# Patient Record
Sex: Male | Born: 1945 | Race: White | Hispanic: No | Marital: Single | Smoking: Former smoker
Health system: Southern US, Community
[De-identification: ages and names within clinical notes are randomized; demographics above are authoritative.]

## PROBLEM LIST (undated history)

## (undated) DIAGNOSIS — C61 Malignant neoplasm of prostate: Secondary | ICD-10-CM

## (undated) DIAGNOSIS — I248 Other forms of acute ischemic heart disease: Secondary | ICD-10-CM

## (undated) DIAGNOSIS — F1010 Alcohol abuse, uncomplicated: Secondary | ICD-10-CM

## (undated) DIAGNOSIS — I350 Nonrheumatic aortic (valve) stenosis: Secondary | ICD-10-CM

## (undated) DIAGNOSIS — F0390 Unspecified dementia without behavioral disturbance: Secondary | ICD-10-CM

## (undated) DIAGNOSIS — H9190 Unspecified hearing loss, unspecified ear: Secondary | ICD-10-CM

## (undated) DIAGNOSIS — K219 Gastro-esophageal reflux disease without esophagitis: Secondary | ICD-10-CM

## (undated) DIAGNOSIS — F319 Bipolar disorder, unspecified: Secondary | ICD-10-CM

## (undated) DIAGNOSIS — I5189 Other ill-defined heart diseases: Secondary | ICD-10-CM

## (undated) DIAGNOSIS — E785 Hyperlipidemia, unspecified: Secondary | ICD-10-CM

## (undated) DIAGNOSIS — F0280 Dementia in other diseases classified elsewhere without behavioral disturbance: Secondary | ICD-10-CM

## (undated) DIAGNOSIS — R011 Cardiac murmur, unspecified: Secondary | ICD-10-CM

## (undated) DIAGNOSIS — I639 Cerebral infarction, unspecified: Secondary | ICD-10-CM

## (undated) DIAGNOSIS — J449 Chronic obstructive pulmonary disease, unspecified: Secondary | ICD-10-CM

## (undated) DIAGNOSIS — G4730 Sleep apnea, unspecified: Secondary | ICD-10-CM

## (undated) DIAGNOSIS — I10 Essential (primary) hypertension: Secondary | ICD-10-CM

## (undated) DIAGNOSIS — E559 Vitamin D deficiency, unspecified: Secondary | ICD-10-CM

## (undated) HISTORY — DX: Malignant neoplasm of prostate: C61

## (undated) HISTORY — DX: Alcohol abuse, uncomplicated: F10.10

## (undated) HISTORY — DX: Hyperlipidemia, unspecified: E78.5

## (undated) HISTORY — DX: Vitamin D deficiency, unspecified: E55.9

## (undated) HISTORY — PX: HERNIA REPAIR: SHX51

## (undated) HISTORY — DX: Essential (primary) hypertension: I10

## (undated) HISTORY — DX: Gastro-esophageal reflux disease without esophagitis: K21.9

## (undated) HISTORY — DX: Dementia in other diseases classified elsewhere, unspecified severity, without behavioral disturbance, psychotic disturbance, mood disturbance, and anxiety: F02.80

## (undated) HISTORY — PX: NM MYOVIEW LTD: HXRAD82

## (undated) HISTORY — DX: Bipolar disorder, unspecified: F31.9

## (undated) HISTORY — DX: Sleep apnea, unspecified: G47.30

## (undated) HISTORY — DX: Other forms of acute ischemic heart disease: I24.8

## (undated) HISTORY — PX: KNEE SURGERY: SHX244

---

## 2003-03-24 ENCOUNTER — Other Ambulatory Visit: Payer: Self-pay

## 2003-08-12 ENCOUNTER — Other Ambulatory Visit: Payer: Self-pay

## 2003-09-21 ENCOUNTER — Other Ambulatory Visit: Payer: Self-pay

## 2003-12-21 ENCOUNTER — Emergency Department: Payer: Self-pay | Admitting: Unknown Physician Specialty

## 2004-03-19 ENCOUNTER — Emergency Department (HOSPITAL_COMMUNITY): Admission: EM | Admit: 2004-03-19 | Discharge: 2004-03-19 | Payer: Self-pay | Admitting: Emergency Medicine

## 2004-03-27 ENCOUNTER — Emergency Department: Payer: Self-pay | Admitting: General Practice

## 2004-03-27 ENCOUNTER — Emergency Department: Payer: Self-pay | Admitting: Emergency Medicine

## 2006-04-29 ENCOUNTER — Emergency Department: Payer: Self-pay

## 2006-04-29 ENCOUNTER — Other Ambulatory Visit: Payer: Self-pay

## 2006-06-28 ENCOUNTER — Other Ambulatory Visit: Payer: Self-pay

## 2006-06-28 ENCOUNTER — Emergency Department: Payer: Self-pay | Admitting: Emergency Medicine

## 2007-04-03 ENCOUNTER — Other Ambulatory Visit: Payer: Self-pay

## 2007-04-03 ENCOUNTER — Inpatient Hospital Stay: Payer: Self-pay | Admitting: Internal Medicine

## 2009-06-02 ENCOUNTER — Inpatient Hospital Stay: Payer: Self-pay | Admitting: Psychiatry

## 2010-04-02 ENCOUNTER — Emergency Department: Payer: Self-pay | Admitting: Emergency Medicine

## 2010-08-03 ENCOUNTER — Emergency Department: Payer: Self-pay | Admitting: Emergency Medicine

## 2011-08-26 ENCOUNTER — Emergency Department: Payer: Self-pay | Admitting: Emergency Medicine

## 2011-08-26 LAB — URINALYSIS, COMPLETE
Leukocyte Esterase: NEGATIVE
Squamous Epithelial: NONE SEEN
WBC UR: NONE SEEN /HPF (ref 0–5)

## 2011-08-26 LAB — BASIC METABOLIC PANEL
BUN: 8 mg/dL (ref 7–18)
Co2: 27 mmol/L (ref 21–32)
EGFR (African American): >60
Glucose: 112 mg/dL — ABNORMAL HIGH (ref 65–99)
Osmolality: 288 (ref 275–301)
Potassium: 4 mmol/L (ref 3.5–5.1)

## 2011-08-26 LAB — CBC
HGB: 15.7 g/dL (ref 13.0–18.0)
MCH: 34.3 pg — ABNORMAL HIGH (ref 26.0–34.0)
MCHC: 33.2 g/dL (ref 32.0–36.0)
MCV: 103 fL — ABNORMAL HIGH (ref 80–100)

## 2011-08-26 LAB — ETHANOL
Ethanol %: 0.163 % — ABNORMAL HIGH (ref 0.000–0.080)
Ethanol %: 0.135 % — ABNORMAL HIGH (ref 0.000–0.080)
Ethanol: 135 mg/dL

## 2012-04-30 ENCOUNTER — Emergency Department: Payer: Self-pay | Admitting: Emergency Medicine

## 2012-04-30 LAB — CBC WITH DIFFERENTIAL/PLATELET
HCT: 43 % (ref 40.0–52.0)
Lymphocyte %: 16.2 %
MCH: 33.6 pg (ref 26.0–34.0)
Monocyte #: 0.7 x10 3/mm (ref 0.2–1.0)
Monocyte %: 9.1 %
Neutrophil #: 5.6 10*3/uL (ref 1.4–6.5)
RBC: 4.38 10*6/uL — ABNORMAL LOW (ref 4.40–5.90)
RDW: 12.5 % (ref 11.5–14.5)
WBC: 7.9 10*3/uL (ref 3.8–10.6)

## 2013-12-22 LAB — PULMONARY FUNCTION TEST

## 2014-01-17 DIAGNOSIS — I248 Other forms of acute ischemic heart disease: Secondary | ICD-10-CM

## 2014-01-17 DIAGNOSIS — I2489 Other forms of acute ischemic heart disease: Secondary | ICD-10-CM | POA: Insufficient documentation

## 2014-01-17 HISTORY — DX: Other forms of acute ischemic heart disease: I24.89

## 2014-01-17 HISTORY — DX: Other forms of acute ischemic heart disease: I24.8

## 2014-01-19 ENCOUNTER — Inpatient Hospital Stay: Payer: Self-pay | Admitting: Internal Medicine

## 2014-01-19 LAB — CBC
HCT: 43.2 % (ref 40.0–52.0)
HGB: 14.7 g/dL (ref 13.0–18.0)
MCH: 33.2 pg (ref 26.0–34.0)
MCHC: 34 g/dL (ref 32.0–36.0)
MCV: 98 fL (ref 80–100)
PLATELETS: 158 10*3/uL (ref 150–440)
RBC: 4.42 10*6/uL (ref 4.40–5.90)
RDW: 12.8 % (ref 11.5–14.5)
WBC: 10.7 10*3/uL — ABNORMAL HIGH (ref 3.8–10.6)

## 2014-01-19 LAB — DRUG SCREEN, URINE

## 2014-01-19 LAB — URINALYSIS, COMPLETE
Bacteria: NONE SEEN
Bilirubin,UR: NEGATIVE
GLUCOSE, UR: NEGATIVE mg/dL (ref 0–75)
Leukocyte Esterase: NEGATIVE
Nitrite: NEGATIVE
PH: 5 (ref 4.5–8.0)
PROTEIN: NEGATIVE
RBC,UR: <1 /HPF (ref 0–5)
Specific Gravity: 1.005 (ref 1.003–1.030)
Squamous Epithelial: NONE SEEN
WBC UR: 1 /HPF (ref 0–5)

## 2014-01-19 LAB — CK-MB: CK-MB: 6.9 ng/mL — AB (ref 0.5–3.6)

## 2014-01-19 LAB — PROTIME-INR
INR: 1
Prothrombin Time: 13 secs (ref 11.5–14.7)

## 2014-01-19 LAB — BASIC METABOLIC PANEL
Anion Gap: 16 (ref 7–16)
BUN: 20 mg/dL — ABNORMAL HIGH (ref 7–18)
CALCIUM: 8.4 mg/dL — AB (ref 8.5–10.1)
CHLORIDE: 87 mmol/L — AB (ref 98–107)
Co2: 26 mmol/L (ref 21–32)
Creatinine: 1.5 mg/dL — ABNORMAL HIGH (ref 0.60–1.30)
EGFR (African American): >60
GFR CALC NON AF AMER: 50 — AB
Glucose: 96 mg/dL (ref 65–99)
OSMOLALITY: 261 (ref 275–301)
Potassium: 3.2 mmol/L — ABNORMAL LOW (ref 3.5–5.1)
Sodium: 129 mmol/L — ABNORMAL LOW (ref 136–145)

## 2014-01-19 LAB — APTT: ACTIVATED PTT: 31.5 s (ref 23.6–35.9)

## 2014-01-19 LAB — TROPONIN I: Troponin-I: 0.36 ng/mL — ABNORMAL HIGH

## 2014-01-19 LAB — ETHANOL: ETHANOL LVL: 325 mg/dL — AB

## 2014-01-19 LAB — MAGNESIUM: MAGNESIUM: 1.9 mg/dL

## 2014-01-20 DIAGNOSIS — I255 Ischemic cardiomyopathy: Secondary | ICD-10-CM

## 2014-01-20 DIAGNOSIS — I10 Essential (primary) hypertension: Secondary | ICD-10-CM

## 2014-01-20 LAB — BASIC METABOLIC PANEL
ANION GAP: 13 (ref 7–16)
BUN: 20 mg/dL — AB (ref 7–18)
CALCIUM: 7.8 mg/dL — AB (ref 8.5–10.1)
CHLORIDE: 98 mmol/L (ref 98–107)
CREATININE: 1.23 mg/dL (ref 0.60–1.30)
Co2: 24 mmol/L (ref 21–32)
EGFR (African American): >60
GLUCOSE: 95 mg/dL (ref 65–99)
Osmolality: 273 (ref 275–301)
POTASSIUM: 3.2 mmol/L — AB (ref 3.5–5.1)
SODIUM: 135 mmol/L — AB (ref 136–145)

## 2014-01-20 LAB — CK-MB
CK-MB: 7.4 ng/mL — ABNORMAL HIGH (ref 0.5–3.6)
CK-MB: 6.3 ng/mL — AB (ref 0.5–3.6)

## 2014-01-20 LAB — CBC WITH DIFFERENTIAL/PLATELET
BASOS ABS: 0 10*3/uL (ref 0.0–0.1)
BASOS PCT: 0.3 %
EOS PCT: 0.4 %
Eosinophil #: 0 10*3/uL (ref 0.0–0.7)
HCT: 39.9 % — AB (ref 40.0–52.0)
HGB: 13.6 g/dL (ref 13.0–18.0)
LYMPHS ABS: 3.2 10*3/uL (ref 1.0–3.6)
Lymphocyte %: 31.3 %
MCH: 33.2 pg (ref 26.0–34.0)
MCHC: 34.1 g/dL (ref 32.0–36.0)
MCV: 98 fL (ref 80–100)
MONO ABS: 0.7 x10 3/mm (ref 0.2–1.0)
Monocyte %: 6.6 %
NEUTROS PCT: 61.4 %
Neutrophil #: 6.3 10*3/uL (ref 1.4–6.5)
Platelet: 159 10*3/uL (ref 150–440)
RBC: 4.1 10*6/uL — ABNORMAL LOW (ref 4.40–5.90)
RDW: 12.4 % (ref 11.5–14.5)
WBC: 10.2 10*3/uL (ref 3.8–10.6)

## 2014-01-20 LAB — CK: CK, TOTAL: 2704 U/L — AB

## 2014-01-20 LAB — HEPARIN LEVEL (UNFRACTIONATED): Anti-Xa(Unfractionated): 0.52 IU/mL (ref 0.30–0.70)

## 2014-01-20 LAB — TROPONIN I
TROPONIN-I: 0.42 ng/mL — AB
Troponin-I: 0.46 ng/mL — ABNORMAL HIGH

## 2014-01-21 DIAGNOSIS — I214 Non-ST elevation (NSTEMI) myocardial infarction: Secondary | ICD-10-CM

## 2014-01-21 LAB — CBC WITH DIFFERENTIAL/PLATELET
BASOS ABS: 0 10*3/uL (ref 0.0–0.1)
BASOS PCT: 0.4 %
EOS ABS: 0.1 10*3/uL (ref 0.0–0.7)
Eosinophil %: 1.7 %
HCT: 35.5 % — ABNORMAL LOW (ref 40.0–52.0)
HGB: 12.2 g/dL — AB (ref 13.0–18.0)
LYMPHS ABS: 2.5 10*3/uL (ref 1.0–3.6)
Lymphocyte %: 37.3 %
MCH: 33.4 pg (ref 26.0–34.0)
MCHC: 34.5 g/dL (ref 32.0–36.0)
MCV: 97 fL (ref 80–100)
Monocyte #: 0.7 x10 3/mm (ref 0.2–1.0)
Monocyte %: 10.3 %
NEUTROS ABS: 3.3 10*3/uL (ref 1.4–6.5)
Neutrophil %: 50.3 %
PLATELETS: 130 10*3/uL — AB (ref 150–440)
RBC: 3.66 10*6/uL — AB (ref 4.40–5.90)
RDW: 13 % (ref 11.5–14.5)
WBC: 6.6 10*3/uL (ref 3.8–10.6)

## 2014-01-21 LAB — BASIC METABOLIC PANEL
Anion Gap: 7 (ref 7–16)
BUN: 18 mg/dL (ref 7–18)
CHLORIDE: 108 mmol/L — AB (ref 98–107)
Calcium, Total: 7.7 mg/dL — ABNORMAL LOW (ref 8.5–10.1)
Co2: 25 mmol/L (ref 21–32)
Creatinine: 0.94 mg/dL (ref 0.60–1.30)
EGFR (African American): >60
EGFR (Non-African Amer.): >60
Glucose: 83 mg/dL (ref 65–99)
OSMOLALITY: 280 (ref 275–301)
Potassium: 3.8 mmol/L (ref 3.5–5.1)
Sodium: 140 mmol/L (ref 136–145)

## 2014-01-21 LAB — HEPARIN LEVEL (UNFRACTIONATED): ANTI-XA(UNFRACTIONATED): 0.27 [IU]/mL — AB (ref 0.30–0.70)

## 2014-01-21 LAB — CK: CK, TOTAL: 926 U/L — AB

## 2014-01-23 DIAGNOSIS — R7989 Other specified abnormal findings of blood chemistry: Secondary | ICD-10-CM

## 2014-01-26 DIAGNOSIS — I214 Non-ST elevation (NSTEMI) myocardial infarction: Secondary | ICD-10-CM

## 2014-02-16 ENCOUNTER — Encounter: Payer: Self-pay | Admitting: *Deleted

## 2014-02-17 ENCOUNTER — Encounter: Payer: Self-pay | Admitting: *Deleted

## 2014-02-17 ENCOUNTER — Encounter: Payer: Medicare Other | Admitting: Cardiology

## 2014-03-31 ENCOUNTER — Encounter: Payer: Self-pay | Admitting: Cardiology

## 2014-03-31 ENCOUNTER — Ambulatory Visit (INDEPENDENT_AMBULATORY_CARE_PROVIDER_SITE_OTHER): Payer: Medicare Other | Admitting: Cardiology

## 2014-03-31 VITALS — BP 128/74 | HR 61 | Ht 68.0 in | Wt 157.2 lb

## 2014-03-31 DIAGNOSIS — E785 Hyperlipidemia, unspecified: Secondary | ICD-10-CM

## 2014-03-31 DIAGNOSIS — I10 Essential (primary) hypertension: Secondary | ICD-10-CM

## 2014-03-31 DIAGNOSIS — I248 Other forms of acute ischemic heart disease: Secondary | ICD-10-CM

## 2014-03-31 DIAGNOSIS — R011 Cardiac murmur, unspecified: Secondary | ICD-10-CM

## 2014-03-31 DIAGNOSIS — R0602 Shortness of breath: Secondary | ICD-10-CM

## 2014-03-31 DIAGNOSIS — F1010 Alcohol abuse, uncomplicated: Secondary | ICD-10-CM

## 2014-03-31 NOTE — Patient Instructions (Signed)
You are doing well.  No medication changes were made today.  Follow up with Dr. Ellyn Hack in 6 months.

## 2014-04-02 ENCOUNTER — Encounter: Payer: Self-pay | Admitting: Cardiology

## 2014-04-02 DIAGNOSIS — F1010 Alcohol abuse, uncomplicated: Secondary | ICD-10-CM | POA: Insufficient documentation

## 2014-04-02 DIAGNOSIS — I10 Essential (primary) hypertension: Secondary | ICD-10-CM | POA: Insufficient documentation

## 2014-04-02 DIAGNOSIS — E785 Hyperlipidemia, unspecified: Secondary | ICD-10-CM | POA: Insufficient documentation

## 2014-04-02 DIAGNOSIS — R011 Cardiac murmur, unspecified: Secondary | ICD-10-CM | POA: Insufficient documentation

## 2014-04-02 NOTE — Assessment & Plan Note (Signed)
Well-controlled as indicated above. He is pretty much at goal for coronary disease on moderate-dose simvastatin.

## 2014-04-02 NOTE — Assessment & Plan Note (Addendum)
He did have elevated troponins in the setting of fall. His cardiac risk factors including long-term smoking, hypertension and dyslipidemia on statin.he has not had any further symptoms since November. He does seem to be very active and denies any chest tightness or pressure walking on a daily basis.  As indicated above. He was supposed to have been scheduled with an outpatient stress test but never occurred. This will need to be scheduled. He will need to be contacted as I was not aware that he had not had done. He also does have some minor T-wave inversions in the septal leads which could be concerning for ischemia. They intermittently noted on EKGs that reviewed in the [facility] chart.

## 2014-04-02 NOTE — Assessment & Plan Note (Signed)
I didn't hear any obvious murmur. There may have been a soft systolic ejection murmur heard at the right sternal border.  He did not have an echocardiogram while in the hospital as intended. Not unreasonable to get an echocardiogram as well, but we can wait to see what the results of his nuclear stress test or. As the murmur does not seem very loud, and no obvious rush to investigate.

## 2014-04-02 NOTE — Assessment & Plan Note (Signed)
He actually seems to be doing well with alcohol cessation. We will see how long he is able to say on the way in. Because he is currently trying to quit drinking, did not choose this option and discuss importance of smoking cessation.

## 2014-04-02 NOTE — Assessment & Plan Note (Signed)
Well-controlled on beta blocker and ACE inhibitor. Excellent cardiac regimen.

## 2014-04-02 NOTE — Progress Notes (Addendum)
PATIENT: Adam Good MRN: [id number] DOB: [birth year] PCP: Adam Market, MD  Clinic Note: Chief Complaint  Patient presents with  . other    F/u from [facility] due to c/o sob with exertion. Meds reviewed verbally with pt.   HPI: Adam Good is a 69 y.o. male with a PMH below who presents today for Hospital followup. He is a history of hypertension treated dyslipidemia bipolar disorder and alcohol abuse who was admitted to [facility] on 01/19/2014 after sustaining an unwitnessed fall on concrete - & hit the back of his head on the ground. Apparently he been drinking all abdominal pain and noted some chest pain. He was found to have very high blood alcohol level CCXXV and had mild elevation of CK-MB  As well as troponins. His treadmill 1-0.4-3 he also had mild elevation in creatinine suggesting dehydration. He was extremely intoxicated  And we were unable to obtain a history. [facility] was counseled to for the positive troponin levels -- the initial impression mildly positive levels were that this was because of a demand ischemia was down for an unknown amount of time. His CK-MB levels and CK were also elevated. He did not have any more chest pain during the hospital stay. The recommendation was outpatient stress test. ( unfortunately, when I was reviewing his chart, I came across a stress test from January 2009 and misinterpreted this was from this year).  He was discharged with plans to followup. Unfortunately it has been almost 2 months, he never had a stress test after the discharge. Is very understanding, and his overall poor historian.  Interval History: today without any major complaints. No significant symptoms to speak of. He stated that he took my place and drinking. He is now been 3 weeks sober. He is not had any further episodes of chest pain. He walks daily without any symptoms. He says if he walks an increase risk case he will get somewhat short of breath - which is chronic. He  denies any resting chest pain or dyspnea. He denies any PND, orthopnea or edema. No further episodes of syncope or near syncope, TIA/amaurosis fugax.   No rapid or irregular heartbeats.  Past Medical History  Diagnosis Date  . Bipolar disorder   . Gastroesophageal reflux disease   . Essential hypertension   . Alcohol abuse   . Heart murmur     Not heard on exam  . Demand ischemia of myocardium Nov 2015    Positive troponins in the setting of a fall during intoxication   . Dyslipidemia     On statin: FLP 12/2013: TC 143, TG 144, HDL 45, LDL 69  . Sleep apnea     Prior Cardiac Evaluation and Past Surgical History: Past Surgical History  Procedure Laterality Date  . Hernia repair    . Knee surgery    . Nm myoview ltd  Jan 2009    No ischemia/infarct. Normal EF - 73%    No Known Allergies  Current Outpatient Prescriptions  Medication Sig Dispense Refill  . albuterol (PROVENTIL HFA;VENTOLIN HFA) 108 (90 BASE) MCG/ACT inhaler Inhale 2 puffs into the lungs every 4 (four) hours as needed for wheezing or shortness of breath.    Marland Kitchen aspirin 81 MG tablet Take 81 mg by mouth daily.    . carvedilol (COREG) 3.125 MG tablet Take 3.125 mg by mouth 2 (two) times daily with a meal.    . lisinopril (PRINIVIL,ZESTRIL) 20 MG tablet Take 20 mg by mouth  daily.    . simvastatin (ZOCOR) 20 MG tablet Take 20 mg by mouth daily.    Marland Kitchen tiZANidine (ZANAFLEX) 4 MG tablet Take 4 mg by mouth 3 (three) times daily.     No current facility-administered medications for this visit.   History  Substance Use Topics  . Smoking status: Current Every Day Smoker -- 0.50 packs/day for 57 years    Types: Cigarettes  . Smokeless tobacco: Current User    Types: Snuff  . Alcohol Use: 0.0 oz/week    0 Not specified per week     Comment: He has been a heavy alcohol drinker for > 30 yrs.  Stats that he quit 3 weeks ago.    family history includes Cancer in his father.  ROS: A comprehensive Review of Systems - was  performed; poor historian  Review of Systems  Constitutional: Negative for malaise/fatigue.  Respiratory: Positive for cough and shortness of breath (Exertional. Chronic). Negative for hemoptysis.   Cardiovascular: Negative for claudication.  Gastrointestinal: Negative for blood in stool and melena.  Genitourinary: Negative for hematuria.  Musculoskeletal: Positive for joint pain.  Neurological: Negative for dizziness.  Endo/Heme/Allergies: Does not bruise/bleed easily.  Psychiatric/Behavioral: Positive for memory loss. Negative for depression. The patient is not nervous/anxious.   All other systems reviewed and are negative.  PHYSICAL EXAM BP 128/74 mmHg  Pulse 61  Ht 5\' 8"  (1.727 m)  Wt 157 lb 4 oz (71.328 kg)  BMI 23.92 kg/m2 General appearance: alert, cooperative, appears older than stated age, icteric and Relatively slow mentation. Smells like cigarettes, mildly disheveled Neck: no adenopathy, no carotid bruit and no JVD Lungs: Diffuse mild rhonchorous interstitial sounds with late extra wheezing. Nonlabored Heart: regular rate and rhythm, S1, S2 normal, no murmur, click, rub or gallop and normal apical impulse Abdomen: soft, non-tender; bowel sounds normal; no masses,  no organomegaly Extremities: extremities normal, atraumatic, no cyanosis or edema Pulses: 2+ and symmetric Neurologic: Grossly normal; relatively slowed mentation - slow to answer.   Adult ECG Report  Rate: 61 ;  Rhythm: normal sinus rhythm;normal axis, intervals and durations and voltage.; nonspecific anteroseptal T wave inversions. Otherwise normal EKG  Recent Labs: note additional labs since hospital stay.  ASSESSMENT / PLAN: Adam Good. Difficult to understand gentleman with long-standing history of heavy alcohol abuse admitted in November following a fall with head contusion/laceration. He did have mild elevated troponins at that time and apparently there was some complaint of chest pain, however he was so  intoxicated he does not remember event.  He has not had any further cardiac symptoms to speak of since. Unfortunately I saw the stress test from January 2009, and thought your to have a stress test. I inaccurately recommended simply having six-month followup. He will be contacted to discuss the need to actually scheduled the plan outpatient stress test.  Demand ischemia of myocardium He did have elevated troponins in the setting of fall. His cardiac risk factors including long-term smoking, hypertension and dyslipidemia on statin.he has not had any further symptoms since November. He does seem to be very active and denies any chest tightness or pressure walking on a daily basis.  As indicated above. He was supposed to have been scheduled with an outpatient stress test but never occurred. This will need to be scheduled. He will need to be contacted as I was not aware that he had not had done. He also does have some minor T-wave inversions in the septal leads which could be concerning  for ischemia. They intermittently noted on EKGs that reviewed in the [facility] chart.    Essential hypertension Well-controlled on beta blocker and ACE inhibitor. Excellent cardiac regimen.   Dyslipidemia Well-controlled as indicated above. He is pretty much at goal for coronary disease on moderate-dose simvastatin.   Alcohol abuse He actually seems to be doing well with alcohol cessation. We will see how long he is able to say on the way in. Because he is currently trying to quit drinking, did not choose this option and discuss importance of smoking cessation.   Heart murmur I didn't hear any obvious murmur. There may have been a soft systolic ejection murmur heard at the right sternal border.  He did not have an echocardiogram while in the hospital as intended. Not unreasonable to get an echocardiogram as well, but we can wait to see what the results of his nuclear stress test or. As the murmur does not seem very  loud, and no obvious rush to investigate.     Orders Placed This Encounter  Procedures  . EKG 12-Lead    Order Specific Question:  Where should this test be performed    Answer:  [facility]   Myoview nuclear stress test will be ordered at [facility].   No orders of the defined types were placed in this encounter.    Followup: 6 months pending results of stress test.  Hughie Melroy W. Ellyn Hack, M.D., M.S. Interventional Cardiolgy [facility]

## 2014-04-06 ENCOUNTER — Telehealth: Payer: Self-pay | Admitting: *Deleted

## 2014-04-06 NOTE — Telephone Encounter (Signed)
Patient states he is very sick with a cold He would like to know how long he can wait to have this test

## 2014-04-06 NOTE — Telephone Encounter (Signed)
Can wait a week or 2 until feels better.  [place]

## 2014-04-06 NOTE — Telephone Encounter (Signed)
LVM to inform patient per Dr. Ellyn Hack       "So I completely misinterpreted the chart information that I had a talk to your he had a nuclear stress test. That never got for her after his hospital stay. The one that was in his old chart was from 2009.      Because he did have positive troponins and has T-wave inversions on his EKG, he probably should have a Myoview stress test ordered. Can you call him and have that scheduled? "

## 2014-04-06 NOTE — Telephone Encounter (Signed)
Patient returned call. Please call back

## 2014-04-07 NOTE — Telephone Encounter (Signed)
LVM to schedule patient for stress test in 2 weeks 1/20

## 2014-04-07 NOTE — Telephone Encounter (Signed)
Please call patient back at [phone].  Patient is HOH please speak loudly.

## 2014-04-08 NOTE — Telephone Encounter (Signed)
lvm 1/21

## 2014-04-21 NOTE — Telephone Encounter (Signed)
Left message with patients friend for him to call back 2/3

## 2014-05-07 ENCOUNTER — Telehealth: Payer: Self-pay | Admitting: *Deleted

## 2014-05-07 DIAGNOSIS — R7989 Other specified abnormal findings of blood chemistry: Principal | ICD-10-CM

## 2014-05-07 DIAGNOSIS — R9431 Abnormal electrocardiogram [ECG] [EKG]: Secondary | ICD-10-CM

## 2014-05-07 DIAGNOSIS — R778 Other specified abnormalities of plasma proteins: Secondary | ICD-10-CM

## 2014-05-07 NOTE — Telephone Encounter (Signed)
   [place]  Your caregiver has ordered a Stress Test with nuclear imaging. The purpose of this test is to evaluate the blood supply to your heart muscle. This procedure is referred to as a "Non-Invasive Stress Test." This is because other than having an IV started in your vein, nothing is inserted or "invades" your body. Cardiac stress tests are done to find areas of poor blood flow to the heart by determining the extent of coronary artery disease (CAD). Some patients exercise on a treadmill, which naturally increases the blood flow to your heart, while others who are  unable to walk on a treadmill due to physical limitations have a pharmacologic/chemical stress agent called Lexiscan . This medicine will mimic walking on a treadmill by temporarily increasing your coronary blood flow.   Please note: these test may take anywhere between 2-4 hours to complete  PLEASE REPORT TO [facility] AT THE FIRST DESK WILL DIRECT YOU WHERE TO GO  Date of Procedure:________2/24/16_____________________________  Arrival Time for Procedure:______0945 am________________________    PLEASE NOTIFY THE OFFICE AT LEAST 24 HOURS IN ADVANCE IF YOU ARE UNABLE TO KEEP YOUR APPOINTMENT.  [phone] AND  PLEASE NOTIFY NUCLEAR MEDICINE AT [facility] AT LEAST 24 HOURS IN ADVANCE IF YOU ARE UNABLE TO KEEP YOUR APPOINTMENT. [phone]  How to prepare for your Myoview test:  1. Do not eat or drink after midnight 2. No caffeine for 24 hours prior to test 3. No smoking 24 hours prior to test. 4. Your medication may be taken with water.  If your doctor stopped a medication because of this test, do not take that medication. 5. Ladies, please do not wear dresses.  Skirts or pants are appropriate. Please wear a short sleeve shirt. 6. No perfume, cologne or lotion. 7. Wear comfortable walking shoes. No heels!  Patient scheduled for Lexiscan 05/12/14 per his request  Instructions reviewed with  patients friend per his request because he stated he is "hard of hearing"  Patients friend verbalized understanding  They stated they wrote instructions down and did not need physical copy

## 2014-05-07 NOTE — Telephone Encounter (Signed)
Wow!!!  Acupuncturist work :-)

## 2014-05-12 ENCOUNTER — Ambulatory Visit: Payer: Self-pay | Admitting: Cardiology

## 2014-05-12 DIAGNOSIS — R9431 Abnormal electrocardiogram [ECG] [EKG]: Secondary | ICD-10-CM | POA: Diagnosis not present

## 2014-05-14 ENCOUNTER — Other Ambulatory Visit: Payer: Self-pay

## 2014-05-14 DIAGNOSIS — R778 Other specified abnormalities of plasma proteins: Secondary | ICD-10-CM

## 2014-05-14 DIAGNOSIS — R7989 Other specified abnormal findings of blood chemistry: Principal | ICD-10-CM

## 2014-05-14 DIAGNOSIS — R9431 Abnormal electrocardiogram [ECG] [EKG]: Secondary | ICD-10-CM

## 2014-05-17 NOTE — Progress Notes (Signed)
Quick Note:  Stress Test looked good!! No sign of significant Heart Artery Disease. Pump function is normal.  Good news!!.  HARDING,DAVID W, MD  Pls forward to PCP ______

## 2014-07-10 NOTE — H&P (Signed)
PATIENT NAME:  Adam Good, Adam Good MR#:  [id number] DATE OF BIRTH:  December 31, 1945  DATE OF ADMISSION:  01/19/2014  PRIMARY CARE PHYSICIAN:  None.  REFERRING PHYSICIAN:  Yetta Numbers. Karma Greaser, MD  CHIEF COMPLAINT:  Fall.  HISTORY OF PRESENT ILLNESS:  Adam Good is a 69 year old male with known history of heavy alcohol use, who is brought to the Emergency Department by EMS for unwitnessed fall on the concrete floor. The patient has a laceration to the back of the head. The patient has been drinking all day long, heavily. He also complained of some abdominal pain initially. The patient also complained of chest pain. The patient is found to have an alcohol level of 325. The patient is also found to have mild elevation of the CK-MB and troponin of 6.9 and 0.36. We do not have a total CPK. The patient is also found to have mild elevation of the BUN and creatinine with creatinine of 1.5, potassium 3.2. Unable to obtain any history from the patient, as the patient is intoxicated with alcohol.  PAST MEDICAL HISTORY AS PER RECORDS:  1.  Gastroesophageal reflux disease.  2.  Bipolar disorder.  3.  Hypertension. 4.  Alcohol abuse.  PAST SURGICAL HISTORY:  Hernia repair.  ALLERGIES:  No known drug allergies.  HOME MEDICATIONS:   1.  Tizanidine 4 mg 3 times a day. 2.  Simvastatin 20 mg once a day. 3.  ProAir 2 puffs every 4 hours as needed. 4.  Lisinopril 20 mg daily. 5.  Hydrochlorothiazide 1 tablet once a day.  SOCIAL HISTORY:  Continues to smoke half to one pack a day. Drinks alcohol heavily of unknown quantity. The patient states he lives with another male. He only has a brother.  FAMILY HISTORY:  Could not tell.  REVIEW OF SYSTEMS:  The patient is extremely intoxicated, unable to obtain information from the patient.  PHYSICAL EXAMINATION: GENERAL:  This is a well-built, well-nourished, age-appropriate male lying down in the bed, not in distress.  VITAL SIGNS:  Temperature 97.5, pulse 86, blood  pressure 158/87, respiratory rate 18, oxygen saturation 97% on room air. HEENT:  Head is normocephalic. He has a laceration to the back of the head. Mucous membranes are dry. Cannot exam the oropharynx. NECK:  Supple. No lymphadenopathy. No JVD. No carotid bruit. CHEST:  Has focal tenderness in multiple areas. LUNGS:  Bilateral decreased breath sounds in the lower lobes. HEART:  S1 and S2, regular, tachycardia.  ABDOMEN:  Bowel sounds present. Soft. Mild tenderness in the epigastric area. No rebound or guarding. No hepatosplenomegaly. EXTREMITIES:  No pedal edema. Pulses are 2+. NEUROLOGIC:  The patient is quite intoxicated, oriented to self, cannot tell the time. Cannot exam the motor and sensory, as the patient is not cooperative.   LABORATORY DATA:  BUN is 20, creatinine 1.50, the rest of all the values within normal limits. CBC:  WBC 10.7, hemoglobin 14.7, platelet count 158,000. CK-MB is 6.9.   IMAGING:  CT of the head without contrast:  No acute intracranial abnormality, left parietal contusion with laceration, generalized cerebral atrophy. CT of the cervical spine:  No fractures.  ASSESSMENT AND PLAN:  Adam Good is a 69 year old male who comes with alcohol intoxification and elevation of the troponin.   1.  Alcohol intoxification.  Will keep the patient on thiamine. Continue with IV fluids. Will follow up for any alcohol withdrawal. 2.  Elevation of the troponin. The patient currently denies having any chest pain, however, mentioned to the Emergency  Department physician. normal sinus rhythm. Will continue to cycle cardiac enzymes x 3. 3.  Fall, mechanical, from the intoxification.  4.  Laceration of the scalp. Continue with wound care. 5.  Hypertension, currently not on any medications.  TIME SPENT:  55 minutes.      ____________________________ Monica Becton, MD pv:nb D: 01/19/2014 23:42:43 ET T: 01/19/2014 23:50:24 ET JOB#: [id number]  cc: Monica Becton, MD,  <Dictator> Monica Becton MD ELECTRONICALLY SIGNED 01/27/2014 21:11

## 2014-07-10 NOTE — Consult Note (Signed)
Brief Consult Note: Diagnosis: alcohol abuse, severe. 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   Patient was seen by consultant.   Consult note dictated.   Comments: PSychiatry: PAtient seen and chart reviewed. PAtient with very long history of alcohol abuse. Past hx of DTs. Currently seems stable without tremor or agitation but does confabulate a bit suggesting chronic memory impairment. Affect and mood stable. Denies any suicidal ideation. Has no interest in being referred to any substance abuse treatment. Says he will quit only if he wants to. No change to treatment plans.  Electronic Signatures: Gonzella Lex (MD)  (Signed [id number] 15:34)  Authored: Brief Consult Note   Last Updated: 09-Nov-15 15:34 by Gonzella Lex (MD)

## 2014-07-10 NOTE — Consult Note (Signed)
PATIENT NAME:  Adam Good, Adam Good MR#:  [id number] DATE OF BIRTH:  Dec 18, 1945  DATE OF CONSULTATION:  01/25/2014  REFERRING PHYSICIAN:   CONSULTING PHYSICIAN:  Gonzella Lex, MD  IDENTIFYING INFORMATION AND REASON FOR CONSULT: This is a 69 year old man with a history of alcohol abuse who came into the hospital with a fall onto a concrete floor with an elevated alcohol level. Consult is for alcohol abuse.   HISTORY OF PRESENT ILLNESS: Information obtained from the patient and the chart. The patient presented to the hospital on November 3 with a blood alcohol level of 325 after having fallen onto a concrete floor while intoxicated and injured himself. He has been going through alcohol withdrawal for several days. The patient tells me that he remembers having the fall. Says that he has been feeling rough for a couple of days, but he is starting to feel better. He tells me he drinks about two to three 40-ounce beers a day, which to him is a small amount compared to his previous use. Denies that he is abusing any other drugs currently. Says that his mood is fine most of the time. Denies any sleep problems. Denies any hallucinations. Here in the hospital going through alcohol withdrawal, he denies that he is having any visual hallucinations. He says he has been feeling a little sick to his stomach, but he is getting over that at this point. He tells me that he relapsed into drinking just a couple weeks ago after having several months of sobriety. He thinks that he can quit drinking at any time and tends to minimize the degree to which it is a problem. He is not taking any psychiatric medicine or getting any outpatient treatment right now.   PAST PSYCHIATRIC HISTORY: The patient has had multiple hospitalizations and multiple Emergency Room visits for intoxication. According to some of the old notes, he has said in the past that he has been drinking heavily since he was about 69 years old. He tells me that he has  been able to stop for months at a time. He does have a history of DTs during detoxification some times in the past. There have been periods when he has come into the hospital with symptoms of depression. Unclear whether this is an independent phenomenon or just related to the alcohol use. He is telling me now that he has been told he has bipolar disorder, although that is not documented in any of our old chart and he is not taking any medicine for bipolar disorder.   The patient does have a past history of wrist cutting while intoxicated.   SOCIAL HISTORY: The patient lives in his own apartment. He has a woman, he says, who comes by and cleans at times. He seems to have a pretty limited other social life. Says he spends most of his time walking around the parking lot picking up beer cans to try and trade for money.   PAST MEDICAL HISTORY: High blood pressure, gastroesophageal reflux disease.   FAMILY HISTORY: Says he does have a family history of substance abuse.   CURRENT MEDICATIONS: On admission, taking simvastatin 20 mg once a day, lisinopril 20 mg a day, hydrochlorothiazide 25 mg a day, tizanidine 4 mg 3 times a day, ProAir as needed.   ALLERGIES: No known drug allergies.   REVIEW OF SYSTEMS: A little bit sick to his stomach, but not too bad. Tired. Mildly shaky. Denies depression. Denies suicidal or homicidal ideation. Denies any hallucinations.  MENTAL STATUS EXAMINATION: This is a disheveled gentleman who looks his stated age or older, passively cooperative with the interview. Eye contact okay. Psychomotor activity limited. He barely rises out of the bed. Speech is decreased in total amount and is slow. Affect smiling, reactive, appropriate. Mood stated as all right. Thoughts are slow, occasionally disorganized. No bizarre delusions noted. Denies hallucinations. Denies any suicidality. He can recall 3/3 objects immediately, 1/3 at 3 minutes. He is alert and oriented x 4.   LABORATORY  RESULTS: Chemistry panel currently still shows a low calcium at 7.7. CKs are still elevated. CBC: Low hematocrit at 35.5, low hemoglobin 12.2. The vitamin B12 level is normal. As noted, he presented originally with a blood alcohol level over 300. Drug screen was negative.   VITAL SIGNS: Currently, blood pressure 133/85, respirations 18, pulse 62, temperature 98.4.   ASSESSMENT: This is a 69 year old gentleman with a history of alcohol abuse, severe, with a history of delirium tremens in the past, who has been in the hospital detoxing for several days. Currently he does not appear to be delirious. No evidence of any suicidality. We talked about substance abuse treatment, and he tells me that he has tried "everything," meaning that he has been to some Alcoholics Anonymous meetings in the past and been to rehabilitation. He says he does not think any of that is of any benefit and he does not want any referral to outpatient treatment. He tells me that he will stop when he feels like stopping.   TREATMENT PLAN: "Some gentle education done about the dangers of alcohol abuse." Supportive counseling done. No change to any medicine. We will follow as needed.   DIAGNOSIS, PRINCIPAL AND PRIMARY:  AXIS I: Alcohol abuse, severe.   SECONDARY DIAGNOSIS:  AXIS I: Delirium from alcohol withdrawal, clearing.   ____________________________ Gonzella Lex, MD jtc:ST D: 01/25/2014 21:23:53 ET T: 01/25/2014 22:11:58 ET JOB#: [id number]  cc: Gonzella Lex, MD, <Dictator> Gonzella Lex MD ELECTRONICALLY SIGNED 02/01/2014 17:02

## 2014-07-10 NOTE — Consult Note (Signed)
General Aspect Primary Cardiologist: New to [facility] _________________  69 year old male with history of known heavy ETOH abuse, HTN, bipolar, and GERD presented to [facility] on 01/19/2014 after sustaining an unwitnessed fall on concrete and being brought in via EMS. Patient had an alcohol level of 325. TnI of 0.36--0.42--0.46. Cardiology was consulted for further evaluation.  _________________  PMH: 1. Heavy alcohol abuse 2. HTN 3. Bipolar 4. GERD _________________   Present Illness 69 year old male with the above problem list who presented to [facility] on 01/19/2014 after sustaining an unwitnessed fall on concrete and being brought in via EMS. Upon his arrival to the ED he had an alcohol level of 325. TnI of 0.36--0.42--0.46.   Patient's history is taken mostly from the [place] as he remains intoxicated. However, he does indicate he was planning to see a cardiologist as an outpatient in the near future for chest pain. He however is unable to tell us which cardiologist this was and when the appointment was scheduled for.   He comes into [facility] via EMS after being found on the concrete 2/2 a fall while being drunk. In this fall he suffered a laceration to the back of his head and a wound to his left hand. His alcohol level was found to be 325. TnI as above. He remains intoxicated currently. Per H&P he had been drinking all day and upon his initial presentation was complaining of abdominal pain and chest pain. CT head: No acute intracranial process. Left parietal scalp contusion with laceration. Generalized cerebral atrophy with mild chronic small vessel ischemic disease. CT spine: No acute traumatic injury within the cervical spine. Remaining labs included SCr 1.50--1.23, K+ 3.2--3.2, UDS negative. He received thiamine 100 mg. He was started on a heparin gtt. Cardiology was consulted for further evaluation.   Physical Exam:  GEN intoxicated   HEENT pink conjunctivae, hearing intact to voice, moist oral mucosa,  unequal pupils that are reactive; edentulous; L sided posterior scalp lacertation   NECK supple   RESP normal resp effort  no use of accessory muscles  wheezing  rhonchi   CARD Regular rate and rhythm  Normal, S1, S2  No murmur   ABD denies tenderness  no hernia  soft   EXTR negative edema   SKIN normal to palpation   NEURO intoxicated   PSYCH poor insight, intoxicated; almost unintelligible speech.   Review of Systems:  Subjective/Chief Complaint fall   General: Fatigue  Weakness   Skin: No Complaints   ENT: head laceration from fall   Eyes: No Complaints   Neck: No Complaints   Respiratory: No complaints - but does wheeze   Cardiovascular: Chest pain or discomfort  Intermitted ~20-30 min episodes of L sided CP/tightness -- was referred to see a Cardiologist; No palpitations, PND/orthopnea or edema   Gastrointestinal: Nausea  NO melena, hematochezia   Genitourinary: No Complaints   Vascular: No Complaints   Psychiatric: potentially still somewhat intoxicated vs. Wernickie's encephalopathy   Review of Systems: All other systems were reviewed and found to be negative   ROS Pt not able to provide ROS   Medications/Allergies Reviewed Medications/Allergies reviewed   Family & Social History:  Family and Social History:  Family History Non-Contributory  intoxicated - can revisit at later date   Social History positive  tobacco, positive ETOH, negative Illicit drugs, Smokes "cigarrillos" ~1/2 ppd   + Tobacco Current (within 1 year)   Place of Living Home  alcohol abuse:    GERD - Esophageal Reflux:    bipolar:    Hypertension:    hernia repair:   Home Medications: Medication Instructions Status  simvastatin 20 mg oral tablet 1 tab(s) orally once a day (at bedtime) Active  tiZANidine 4 mg oral tablet 1 tab(s) orally 3 times a day Active  hydrochlorothiazide 25 mg oral tablet 1 tab(s) orally once a day Active  lisinopril 20 mg oral tablet 1  tab(s) orally once a day Active  ProAir HFA CFC free 90 mcg/inh inhalation aerosol 2 puff(s) inhaled every 4 hours, As Needed - for Shortness of Breath, for Wheezing  Active   Lab Results:  Routine Chem:  04-Nov-15 04:33   Result Comment TROPONIN - RESULTS VERIFIED BY REPEAT TESTING.  - PREV. C/ 01-19-14 _0  BY [facility].Marland KitchenAJO  Result(s) reported on 20 Jan 2014 at 05:33AM.  Glucose, Serum 95  BUN  20  Creatinine (comp) 1.23  Sodium, Serum  135  Potassium, Serum  3.2  Chloride, Serum 98  CO2, Serum 24  Calcium (Total), Serum  7.8  Anion Gap 13  Osmolality (calc) 273  eGFR (African American) >60  eGFR (Non-African American) >60 (eGFR values <90m/min/1.73 m2 may be an indication of chronic kidney disease (CKD). Calculated eGFR, using the MRDR Study equation, is useful in  patients with stable renal function. The eGFR calculation will not be reliable in acutely ill patients when serum creatinine is changing rapidly. It is not useful in patients on dialysis. The eGFR calculation may not be applicable to patients at the low and high extremes of body sizes, pregnant women, and vegetarians.)  Urine Drugs:  [id number]  Tricyclic Antidepressant, Ur Qual (comp) NEGATIVE (Result(s) reported on 19 Jan 2014 at 10:47PM.)  Amphetamines, Urine Qual. NEGATIVE  MDMA, Urine Qual. NEGATIVE  Cocaine Metabolite, Urine Qual. NEGATIVE  Opiate, Urine qual NEGATIVE  Phencyclidine, Urine Qual. NEGATIVE  Cannabinoid, Urine Qual. NEGATIVE  Barbiturates, Urine Qual. NEGATIVE  Benzodiazepine, Urine Qual. NEGATIVE (----------------- The URINE DRUG SCREEN provides only a preliminary, unconfirmed analytical test result and should not be used for non-medical  purposes.  Clinical consideration and professional judgment should be  applied to any positive drug screen result due to possible interfering substances.  A more specific alternate chemical method must be used in order to obtain a confirmed analytical  result.  Gas chromatography/mass spectrometry (GC/MS) is the preferred confirmatory method.)  Methadone, Urine Qual. NEGATIVE  Cardiac:  03-Nov-15 20:36   Troponin I  0.36 (0.00-0.05 0.05 ng/mL or less: NEGATIVE  Repeat testing in 3-6 hrs  if clinically indicated. >0.05 ng/mL: POTENTIAL  MYOCARDIAL INJURY. Repeat  testing in 3-6 hrs if  clinically indicated. NOTE: An increase or decrease  of 30% or more on serial  testing suggests a  clinically important change)  04-Nov-15 00:39   Troponin I  0.42 (0.00-0.05 0.05 ng/mL or less: NEGATIVE  Repeat testing in 3-6 hrs  if clinically indicated. >0.05 ng/mL: POTENTIAL  MYOCARDIAL INJURY. Repeat  testing in 3-6 hrs if  clinically indicated. NOTE: An increase or decrease  of 30% or more on serial  testing suggests a  clinically important change)    04:33   Troponin I  0.46 (0.00-0.05 0.05 ng/mL or less: NEGATIVE  Repeat testing in 3-6 hrs  if clinically indicated. >0.05 ng/mL: POTENTIAL  MYOCARDIAL INJURY. Repeat  testing in 3-6 hrs if  clinically indicated. NOTE: An increase or decrease  of 30% or more on serial  testing suggests  a  clinically important change)  CPK-MB, Serum  6.3 (Result(s) reported on 20 Jan 2014 at 05:11AM.)  Routine UA:  03-Nov-15 22:20   Color (UA) Straw  Clarity (UA) Clear  Glucose (UA) Negative  Bilirubin (UA) Negative  Ketones (UA) Trace  Specific Gravity (UA) 1.005  Blood (UA) 2+  pH (UA) 5.0  Protein (UA) Negative  Nitrite (UA) Negative  Leukocyte Esterase (UA) Negative (Result(s) reported on 19 Jan 2014 at 10:40PM.)  RBC (UA) <1 /HPF  WBC (UA) 1 /HPF  Bacteria (UA) NONE SEEN  Epithelial Cells (UA) NONE SEEN  Result(s) reported on 19 Jan 2014 at 10:40PM.  Routine Hem:  04-Nov-15 04:33   WBC (CBC) 10.2  RBC (CBC)  4.10  Hemoglobin (CBC) 13.6  Hematocrit (CBC)  39.9  Platelet Count (CBC) 159  MCV 98  MCH 33.2  MCHC 34.1  RDW 12.4  Neutrophil % 61.4  Lymphocyte % 31.3   Monocyte % 6.6  Eosinophil % 0.4  Basophil % 0.3  Neutrophil # 6.3  Lymphocyte # 3.2  Monocyte # 0.7  Eosinophil # 0.0  Basophil # 0.0 (Result(s) reported on 20 Jan 2014 at 05:10AM.)   Radiology Results: CT:    03-Nov-15 21:06, CT Cervical Spine Without Contrast  CT Cervical Spine Without Contrast   REASON FOR EXAM:    fall, unknown LOC  COMMENTS:       PROCEDURE: CT  - CT CERVICAL SPINE WO  - Jan 19 2014  9:06PM     CLINICAL DATA:  Initial evaluation for acute trauma.  Fall.    EXAM:  CT HEAD WITHOUT CONTRAST    CT CERVICAL SPINE WITHOUT CONTRAST    TECHNIQUE:  Multidetector CT imaging of the head and cervical spine was  performed following the standard protocol without intravenous  contrast. Multiplanar CT image reconstructions of the cervical spine  were also generated.    COMPARISON:  None available.    FINDINGS:  CT HEAD FINDINGS    Diffuse prominence of the CSF containing spaces is compatible with  generalized cerebral atrophy. Patchy hypodensity within the  periventricular and deep white matter is most compatible with mild  chronic small vessel ischemic changes. Remote lacunar infarct  present within the left basal ganglia.    No acute large vessel territory infarct or intracranial hemorrhage.  No extra-axial fluid collection. No mass lesion, midline shift, or  hydrocephalus.    Scalp contusion with laceration present within the left parietal  scalp. No calvarial fracture.    No acute abnormality seen about the orbits. Remote posttraumatic  deformity present at the left orbital floor.    Paranasal sinuses are clear.    CT CERVICAL SPINE FINDINGS    The vertebral bodies are normally aligned with preservation of the  normal cervical lordosis. Vertebral body heights are preserved.  Normal C1-2 articulations are intact. No prevertebral soft tissue  swelling. No acute fracture or listhesis.    Moderate multilevel degenerative disc disease as evidenced  by  intervertebral disc space narrowing, endplate sclerosis, and  osteophytosis is present, most severe at C4-5, C5-6, and C6-7.    Visualized soft tissues of the neck arewithin normal limits.  Visualized lung apices are clear without evidence of apical  pneumothorax.     IMPRESSION:  CT BRAIN:    1. No acute intracranial process.  2. Left parietal scalp contusion with laceration.  3. Generalized cerebral atrophy withmild chronic small vessel  ischemic disease.    CT CERVICAL SPINE:  No acute traumatic injury within the cervical spine.      Electronically Signed    By: Jeannine Boga M.D.    On: 01/19/2014 22:08         Verified By: Neomia Glass, M.D.,    03-Nov-15 21:06, CT Head Without Contrast  CT Head Without Contrast   REASON FOR EXAM:    fall, unknown LOC  COMMENTS:       PROCEDURE: CT  - CT HEAD WITHOUT CONTRAST  - Jan 19 2014  9:06PM     CLINICAL DATA:  Initial evaluation for acute trauma.  Fall.    EXAM:  CT HEAD WITHOUT CONTRAST    CT CERVICAL SPINE WITHOUT CONTRAST    TECHNIQUE:  Multidetector CT imaging of the head and cervical spine was  performed following the standard protocol without intravenous  contrast. Multiplanar CT image reconstructions of the cervical spine  were also generated.    COMPARISON: None available.    FINDINGS:  CT HEAD FINDINGS    Diffuse prominence of the CSF containing spaces is compatible with  generalized cerebral atrophy. Patchy hypodensity within the  periventricular and deep white matter is most compatible with mild  chronic small vessel ischemic changes. Remote lacunar infarct  present within the left basal ganglia.    No acute large vessel territory infarct or intracranial hemorrhage.  No extra-axial fluid collection. No mass lesion, midline shift, or  hydrocephalus.    Scalp contusion with laceration present within the left parietal  scalp. No calvarial fracture.    No acute abnormality  seen about the orbits. Remote posttraumatic  deformity present at the left orbital floor.    Paranasal sinuses are clear.    CT CERVICAL SPINE FINDINGS    The vertebral bodies are normally aligned with preservation of the  normal cervical lordosis. Vertebral body heights are preserved.  Normal C1-2 articulations are intact. No prevertebral soft tissue  swelling. No acute fracture or listhesis.    Moderate multilevel degenerative disc disease as evidenced by  intervertebral disc space narrowing, endplate sclerosis, and  osteophytosis is present, most severe at C4-5, C5-6, and C6-7.    Visualized soft tissues of the neckare within normal limits.  Visualized lung apices are clear without evidence of apical  pneumothorax.     IMPRESSION:  CT BRAIN:    1. No acute intracranial process.  2. Left parietal scalp contusion with laceration.  3. Generalized cerebral atrophy with mild chronic small vessel  ischemic disease.    CT CERVICAL SPINE:    No acute traumatic injury within the cervical spine.      Electronically Signed    By: Jeannine Boga M.D.    On: 01/19/2014 22:08         Verified By: Neomia Glass, M.D.,    No Known Allergies:   Vital Signs/Nurse's Notes: **Vital Signs.:   04-Nov-15 09:30  Vital Signs Type Q 4hr  Temperature Temperature (F) 98.7  Celsius 37  Pulse Pulse 98  Respirations Respirations 20  Systolic BP Systolic BP 630  Diastolic BP (mmHg) Diastolic BP (mmHg) 68  Mean BP 80  Pulse Ox % Pulse Ox % 92  Pulse Ox Activity Level  At rest  Oxygen Delivery Room Air/ 21 %    Impression 69 year old male with history of known heavy ETOH abuse, HTN, bipolar, and GERD presented to [facility] on 01/19/2014 after sustaining an unwitnessed fall on concrete and being brought in via EMS. Patient had  an alcohol level of 325. TnI of 0.36--0.42--0.46. Cardiology was consulted for further evaluation.  1. Demand ischemia: -Can r/o NSTEMI  -Would be  hessitant to cath him at this time given his intoxication  -Continue heparin gtt for 48 hours -Can pursue Lexiscan Myoview either 11/5 or 11/6 (unable to treadmill 2/2 recent fall) -Echo pending to evaluation LV function -Currently tachycardic - add low dose Coreg 3.125 mg bid  2. HTN: -Controlled  3. ETOH abuse: -Per IM   Electronic Signatures for Addendum Section:  Leonie Man (MD) (Signed Addendum [phone] 12:08)  I personally saw & examined the patient along with Mr. Idolina Primer & reviewed available data.   NSR on tele & unrevealing EKG with mild Troponin +.  He has noted intermittent CP, but denied any Sx @ the time of his "fall".  Denies any Sx of palpitations, or DOE.  Based upon his level of intoxication - he may not even remember Sx.   Demand Ischemia is potentially more likely than ACS with flat trend of Troponin.  However, cannot exclude ACS, so IV Heparin x ~48 hr is not unreasonable.  I would be more in favor of early conservative / non-invasive evaluation -- Lexiscan Myoview as opposed to invasive eval with Cardiac Cath.  He is not a goo cath candidate - would not want to sedate him for fear of concerns for his mental status confusing potential of CVA.  If Myoview is +, would need to see a more stable neurologic status before considering cath.  Otherwise, I agree with the remainder of the note.  DH   Electronic Signatures: Rise Mu (PA-C)  (Signed [id number] 11:35)  Authored: General Aspect/Present Illness, History and Physical Exam, Review of System, Family & Social History, Past Medical History, Home Medications, Labs, Radiology, Allergies, Vital Signs/Nurse's Notes, Impression/Plan Leonie Man (MD)  (Signed [phone] 12:02)  Authored: History and Physical Exam, Review of System, Family & Social History  Co-Signer: General Aspect/Present Illness, History and Physical Exam, Review of System, Family & Social History, Past Medical History, Home Medications, Labs,  Radiology, Allergies, Vital Signs/Nurse's Notes, Impression/Plan   Last Updated: 04-Nov-15 12:08 by Leonie Man (MD)

## 2014-07-10 NOTE — Discharge Summary (Signed)
PATIENT NAME:  Adam Good, Adam Good MR#:  [id number] DATE OF BIRTH:  03-15-1946  DATE OF ADMISSION:  01/19/2014 DATE OF DISCHARGE:  01/26/2014  ADMITTING PHYSICIAN:  Monica Becton, MD  DISCHARGING PHYSICIAN:   Gladstone Lighter, MD  PRIMARY CARE PHYSICIAN:  None.  CONSULTATIONS IN THE HOSPITAL: Cardiology consultation by Dr. Rockey Situ.   DISCHARGE DIAGNOSES: 1.  Alcohol abuse and alcohol withdrawal.  2.  Fall secondary alcohol abuse and intoxication.  3.  Elevated troponin likely demand ischemia. Will need stress as an outpatient.  4.  Hypertension.  5.  Bipolar disorder.  6.  Tobacco use disorder.    DISCHARGE HOME MEDICATIONS:  1.  Tizanidine 4 mg up to 3 times a day 1 tablet.  2.  ProAir inhaler 2 puffs 4 times a day as needed for shortness of breath.  3.  Simvastatin 20 mg p.o. at bedtime 4.  Lisinopril 20 mg p.o. daily.  5.  Aspirin 81 mg p.o. daily.  6.  Coreg 3.125 mg p.o. b.i.d.   DISCHARGE DIET: Low-sodium diet.   DISCHARGE ACTIVITY: As tolerated.   FOLLOWUP INSTRUCTIONS: 1.  PCP follow up in 1 to 2 weeks.  2.  Cardiology follow-up in 3 weeks.  3.  Advised to come to the ER for staples on his forehead to be removed within 1 week.  LABORATORY AND IMAGING STUDIES PRIOR TO DISCHARGE:  WBC 6.6, hemoglobin 12.2, hematocrit 35.5, platelet count 130,000. Sodium 140, potassium 3.8, chloride 108, bicarbonate 25, BUN 18, creatinine 0.94, glucose of 83, calcium of 7.7. Troponins were borderline elevated at 0.46. CK is elevated at 1200, B12 is within normal limits. Urinalysis negative for any infection. Urine toxicology screen was negative on admission. CT of the head showing chronic ischemic changes, no acute intracranial process.  Left parietal scalp contusion with laceration. Generalized cerebellar atrophy noted.  Alcohol level was elevated at 325 mg/dL on admission.   BRIEF HOSPITAL COURSE: [name] is a 69 year old male with a past medical history significant for significant  alcohol abuse, bipolar disorder, hypertension, and was brought in after a fall.   1.  Fall secondary to alcohol intoxication.  Level was elevated at 325 mg/dL. The patient had a contusion, left parietal of his head and then had staples done in the ER.  He had trouble, going through withdrawals while in the hospital and has remained on CIWA protocol.   Cardiology was consulted when his CK-MB and troponins were borderline elevated; however, it was most likely secondary to demand ischemia and not acute myocardial infratcion.  The patient  is not symptomatic, not interested in pursuing further testing at this time. He was recommended outpatient follow-up to get a stress test done. Initially he was on heparin for brief and then that was discontinued.  The patient was also seen by psychiatry for his issue with alcohol abuse. The patient is not ready to quit; understands and acknowledges issues with alcohol.   His medications were adjusted. He is on cardiac medications with aspirin, chloride, lisinopril and statin at this time.  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  He is at his baseline and is being discharged.   DISCHARGE CONDITION: Stable.   DISCHARGE DISPOSITION: Home with home health physical therapy.   TIME SPENT ON DISCHARGE: 40 minutes    ____________________________ Gladstone Lighter, MD rk:jp D: 01/26/2014 14:02:18 ET T: 01/26/2014 17:26:01 ET JOB#: [id number]  cc: Gladstone Lighter, MD, <Dictator> [name]  [name] MD ELECTRONICALLY SIGNED 02/06/2014 14:48

## 2014-09-22 ENCOUNTER — Encounter: Payer: Medicare Other | Admitting: Cardiology

## 2014-09-22 ENCOUNTER — Encounter: Payer: Self-pay | Admitting: *Deleted

## 2014-09-22 ENCOUNTER — Encounter: Payer: Self-pay | Admitting: Cardiology

## 2014-09-22 NOTE — Progress Notes (Signed)
PCP: Lorelee Market, MD  Clinic Note: Chief Complaint  Patient presents with  . Error    HPI: Adam Good is a 69 y.o. male with a PMH alcohol abuse, hypertension/hyperlipidemia who presents today for six-month follow-up for history of demand ischemia, hypertension, hyperlipidemia.  Odas was last seen on 03/31/2014 -> he was doing well without any major complaints. No heart failure or angina type symptoms. No arrhythmias.  Recent Hospitalizations: [facility] in October 2015.  Systane unwitnessed fall on concrete and hit the back of his head on the ground. He had been drinking all day and noted some chest discomfort prior to falling. He had a significantly elevated blood alcohol content level, but his troponin levels also increased) in the setting of worsening creatinine/renal failure). The elevated troponin was thought to be related to demand ischemia, and an outpatient Myoview was performed confirming no evidence ischemia.  Studies Reviewed:   Myoview 2/24/'16: Non-ischemic (see PMH/PSH below)  An Echocardiogram was not performed (unclear if this was not ordered correctly)  Interval History: -- NO SHOW  Past Medical History  Diagnosis Date  . Bipolar disorder   . Gastroesophageal reflux disease   . Essential hypertension   . Alcohol abuse   . Heart murmur     Not heard on exam  . Demand ischemia of myocardium Nov 2015    Positive troponins in the setting of a fall during intoxication; Myoview February 2016: EF 65-70%, no evidence of ischemia or infarction. No RWMA.  . Dyslipidemia     On statin: FLP 12/2013: TC 143, TG 144, HDL 45, LDL 69  . Sleep apnea     Past Surgical History  Procedure Laterality Date  . Hernia repair    . Knee surgery    . Nm myoview ltd  Jan 2009; 05/12/14    a) No ischemia/infarct. Normal EF - 73%; b) EF 65-70%, no evidence of ischemia or infarction. No RWMA.   ROS  Current Outpatient Prescriptions on File Prior to Visit  Medication Sig  Dispense Refill  . albuterol (PROVENTIL HFA;VENTOLIN HFA) 108 (90 BASE) MCG/ACT inhaler Inhale 2 puffs into the lungs every 4 (four) hours as needed for wheezing or shortness of breath.    Marland Kitchen aspirin 81 MG tablet Take 81 mg by mouth daily.    . carvedilol (COREG) 3.125 MG tablet Take 3.125 mg by mouth 2 (two) times daily with a meal.    . lisinopril (PRINIVIL,ZESTRIL) 20 MG tablet Take 20 mg by mouth daily.    . simvastatin (ZOCOR) 20 MG tablet Take 20 mg by mouth daily.    Marland Kitchen tiZANidine (ZANAFLEX) 4 MG tablet Take 4 mg by mouth 3 (three) times daily.     No current facility-administered medications on file prior to visit.   No Known Allergies   History   Social History  . Marital Status: Married    Spouse Name: N/A  . Number of Children: N/A  . Years of Education: N/A   Occupational History  . Not on file.   Social History Main Topics  . Smoking status: Current Every Day Smoker -- 0.50 packs/day for 57 years    Types: Cigarettes  . Smokeless tobacco: Current User    Types: Snuff  . Alcohol Use: 0.0 oz/week    0 Standard drinks or equivalent per week     Comment: He has been a heavy alcohol drinker for > 30 yrs.  Stats that he quit 3 weeks ago.   . Drug  Use: No  . Sexual Activity: Not on file   Other Topics Concern  . Not on file   Social History Narrative   Family History  Problem Relation Age of Onset  . Cancer Father      Wt Readings from Last 3 Encounters:  03/31/14 71.328 kg (157 lb 4 oz)    ASSESSMENT / PLAN: Problem List Items Addressed This Visit    None    Visit Diagnoses    Erroneous encounter - disregard           Leonie Man, M.D., M.S. Interventional Cardiologist   Pager # [phone]      This encounter was created in error - please disregard.

## 2014-10-26 ENCOUNTER — Ambulatory Visit: Payer: Self-pay | Admitting: Unknown Physician Specialty

## 2014-11-16 ENCOUNTER — Encounter: Payer: Self-pay | Admitting: Emergency Medicine

## 2014-11-16 ENCOUNTER — Emergency Department
Admission: EM | Admit: 2014-11-16 | Discharge: 2014-11-17 | Disposition: A | Discharge status: Home / Self Care | Payer: Medicare Other | Attending: Emergency Medicine | Admitting: Emergency Medicine

## 2014-11-16 DIAGNOSIS — F1020 Alcohol dependence, uncomplicated: Secondary | ICD-10-CM

## 2014-11-16 DIAGNOSIS — Z7982 Long term (current) use of aspirin: Secondary | ICD-10-CM | POA: Insufficient documentation

## 2014-11-16 DIAGNOSIS — Z720 Tobacco use: Secondary | ICD-10-CM | POA: Insufficient documentation

## 2014-11-16 DIAGNOSIS — I10 Essential (primary) hypertension: Secondary | ICD-10-CM | POA: Diagnosis not present

## 2014-11-16 DIAGNOSIS — F1010 Alcohol abuse, uncomplicated: Secondary | ICD-10-CM | POA: Diagnosis present

## 2014-11-16 DIAGNOSIS — Y906 Blood alcohol level of 120-199 mg/100 ml: Secondary | ICD-10-CM | POA: Diagnosis not present

## 2014-11-16 DIAGNOSIS — Z79899 Other long term (current) drug therapy: Secondary | ICD-10-CM | POA: Diagnosis not present

## 2014-11-16 LAB — COMPREHENSIVE METABOLIC PANEL
ALBUMIN: 4.8 g/dL (ref 3.5–5.0)
ALK PHOS: 63 U/L (ref 38–126)
ALT: 34 U/L (ref 17–63)
AST: 62 U/L — AB (ref 15–41)
Anion gap: 14 (ref 5–15)
BUN: 10 mg/dL (ref 6–20)
CALCIUM: 9.3 mg/dL (ref 8.9–10.3)
CHLORIDE: 103 mmol/L (ref 101–111)
CO2: 26 mmol/L (ref 22–32)
CREATININE: 0.99 mg/dL (ref 0.61–1.24)
GFR calc non Af Amer: >60 mL/min (ref >60)
GLUCOSE: 171 mg/dL — AB (ref 65–99)
Potassium: 3.8 mmol/L (ref 3.5–5.1)
SODIUM: 143 mmol/L (ref 135–145)
Total Bilirubin: 1.1 mg/dL (ref 0.3–1.2)
Total Protein: 7.6 g/dL (ref 6.5–8.1)

## 2014-11-16 LAB — ETHANOL: Alcohol, Ethyl (B): 122 mg/dL — ABNORMAL HIGH (ref <5)

## 2014-11-16 LAB — CBC
HEMATOCRIT: 45.2 % (ref 40.0–52.0)
Hemoglobin: 15 g/dL (ref 13.0–18.0)
MCH: 34.2 pg — AB (ref 26.0–34.0)
MCHC: 33.3 g/dL (ref 32.0–36.0)
MCV: 102.7 fL — AB (ref 80.0–100.0)
Platelets: 144 10*3/uL — ABNORMAL LOW (ref 150–440)
RBC: 4.4 MIL/uL (ref 4.40–5.90)
RDW: 13.4 % (ref 11.5–14.5)
WBC: 7.6 10*3/uL (ref 3.8–10.6)

## 2014-11-16 MED ORDER — LORAZEPAM 2 MG PO TABS
0.0000 mg | ORAL_TABLET | Freq: Two times a day (BID) | ORAL | Status: DC
  Administered 2014-11-17: 1 mg via ORAL

## 2014-11-16 MED ORDER — VITAMIN B-1 100 MG PO TABS
100.0000 mg | ORAL_TABLET | Freq: Every day | ORAL | Status: DC
  Administered 2014-11-17: 100 mg via ORAL
  Filled 2014-11-16: qty 1

## 2014-11-16 MED ORDER — THIAMINE HCL 100 MG/ML IJ SOLN: 100.0000 mg | Freq: Every day | INTRAMUSCULAR | Status: DC

## 2014-11-16 MED ORDER — LISINOPRIL 20 MG PO TABS
20.0000 mg | ORAL_TABLET | Freq: Once | ORAL | Status: AC
  Administered 2014-11-16: 20 mg via ORAL

## 2014-11-16 MED ORDER — LORAZEPAM 2 MG PO TABS: 0.0000 mg | ORAL_TABLET | Freq: Four times a day (QID) | ORAL | Status: DC

## 2014-11-16 MED ORDER — LORAZEPAM 2 MG/ML IJ SOLN: 0.0000 mg | Freq: Two times a day (BID) | INTRAMUSCULAR | Status: DC

## 2014-11-16 MED ORDER — LORAZEPAM 2 MG/ML IJ SOLN: 0.0000 mg | Freq: Four times a day (QID) | INTRAMUSCULAR | Status: DC

## 2014-11-16 MED ORDER — LORAZEPAM 1 MG PO TABS
ORAL_TABLET | ORAL | Status: AC
  Administered 2014-11-16: 23:00:00
  Filled 2014-11-16: qty 1

## 2014-11-16 MED ORDER — LISINOPRIL 20 MG PO TABS
ORAL_TABLET | ORAL | Status: AC
  Filled 2014-11-16: qty 1

## 2014-11-16 NOTE — ED Provider Notes (Addendum)
[facility] Emergency Department Provider Note  ____________________________________________  Time seen: Approximately 5 PM  I have reviewed the triage vital signs and the nursing notes.   HISTORY  Chief Complaint Drug / Alcohol Assessment    HPI Adam Good is a 69 y.o. male with a history of chronic alcoholism who presents today wanting help with detox. He says he has been drinking for about 50 years and is currently drinking 6-7, 40 ounce drinks per day.He says his last drink was about 15 minutes prior to arrival. Says that he has never withdrawn with a seizure but does have "the shakes" before he drinks everyday.  Also admits to smoking alcohol. Denies any pain at this time. No nausea or vomiting. Denies any suicidal or homicidal ideation. Says he is here today at the encouragement of her friend.   Past Medical History  Diagnosis Date  . Bipolar disorder   . Gastroesophageal reflux disease   . Essential hypertension   . Alcohol abuse   . Heart murmur     Not heard on exam  . Demand ischemia of myocardium Nov 2015    Positive troponins in the setting of a fall during intoxication; Myoview February 2016: EF 65-70%, no evidence of ischemia or infarction. No RWMA.  . Dyslipidemia     On statin: FLP 12/2013: TC 143, TG 144, HDL 45, LDL 69  . Sleep apnea     Patient Active Problem List   Diagnosis Date Noted  . Dyslipidemia   . Essential hypertension   . Alcohol abuse   . Heart murmur   . Demand ischemia of myocardium 01/17/2014    Past Surgical History  Procedure Laterality Date  . Hernia repair    . Knee surgery    . Nm myoview ltd  Jan 2009; 05/12/14    a) No ischemia/infarct. Normal EF - 73%; b) EF 65-70%, no evidence of ischemia or infarction. No RWMA.    Current Outpatient Rx  Name  Route  Sig  Dispense  Refill  . albuterol (PROVENTIL HFA;VENTOLIN HFA) 108 (90 BASE) MCG/ACT inhaler   Inhalation   Inhale 2 puffs into the lungs  every 4 (four) hours as needed for wheezing or shortness of breath.         Marland Kitchen aspirin 81 MG tablet   Oral   Take 81 mg by mouth daily.         . carvedilol (COREG) 3.125 MG tablet   Oral   Take 3.125 mg by mouth 2 (two) times daily with a meal.         . lisinopril (PRINIVIL,ZESTRIL) 20 MG tablet   Oral   Take 20 mg by mouth daily.         . simvastatin (ZOCOR) 20 MG tablet   Oral   Take 20 mg by mouth daily.         Marland Kitchen tiZANidine (ZANAFLEX) 4 MG tablet   Oral   Take 4 mg by mouth 3 (three) times daily.           Allergies Review of patient's allergies indicates no known allergies.  Family History  Problem Relation Age of Onset  . Cancer Father     Social History Social History  Substance Use Topics  . Smoking status: Current Every Day Smoker -- 0.50 packs/day for 57 years    Types: Cigarettes  . Smokeless tobacco: Current User    Types: Snuff  . Alcohol Use: 0.0 oz/week  0 Standard drinks or equivalent per week     Comment: He has been a heavy alcohol drinker for > 30 yrs.  Stats that he quit 3 weeks ago.     Review of Systems Constitutional: No fever/chills Eyes: No visual changes. ENT: No sore throat. Cardiovascular: Denies chest pain. Respiratory: Denies shortness of breath. Gastrointestinal: No abdominal pain.  No nausea, no vomiting.  No diarrhea.  No constipation. Genitourinary: Negative for dysuria. Musculoskeletal: Negative for back pain. Skin: Negative for rash. Neurological: Negative for headaches, focal weakness or numbness.  10-point ROS otherwise negative.  ____________________________________________   PHYSICAL EXAM:  VITAL SIGNS: ED Triage Vitals  Enc Vitals Group     BP 11/16/14 1602 197/108 mmHg     Pulse Rate 11/16/14 1602 86     Resp 11/16/14 1602 18     Temp 11/16/14 1602 97.5 F (36.4 C)     Temp Source 11/16/14 1602 Oral     SpO2 11/16/14 1602 100 %     Weight --      Height --      Head Cir --      Peak  Flow --      Pain Score --      Pain Loc --      Pain Edu? --      Excl. in [place]? --     Constitutional: Alert and oriented. Well appearing and in no acute distress. Eyes: Conjunctivae are normal. PERRL. EOMI. Head: Atraumatic. Nose: No congestion/rhinnorhea. Mouth/Throat: Mucous membranes are moist.  Oropharynx non-erythematous. Neck: No stridor.   Cardiovascular: Normal rate, regular rhythm. Grossly normal heart sounds.  Good peripheral circulation. Respiratory: Normal respiratory effort.  No retractions. Lungs CTAB. Gastrointestinal: Soft and nontender. No distention. No abdominal bruits. No CVA tenderness. Musculoskeletal: No lower extremity tenderness nor edema.  No joint effusions. Neurologic:  Normal speech and language. No gross focal neurologic deficits are appreciated. No gait instability. Skin:  Skin is warm, dry and intact. No rash noted. Psychiatric: Mood and affect are normal. Speech and behavior are normal. Patient is clinically sober at this time. Able to ambulate without any difficulty. Not slurring his words or acting clinically intoxicated. Has capacity to make decisions and good insight into his medical condition.  ____________________________________________   LABS (all labs ordered are listed, but only abnormal results are displayed)  Labs Reviewed  COMPREHENSIVE METABOLIC PANEL - Abnormal; Notable for the following:    Glucose, Bld 171 (*)    AST 62 (*)    All other components within normal limits  ETHANOL - Abnormal; Notable for the following:    Alcohol, Ethyl (B) 122 (*)    All other components within normal limits  CBC - Abnormal; Notable for the following:    MCV 102.7 (*)    MCH 34.2 (*)    Platelets 144 (*)    All other components within normal limits  URINE DRUG SCREEN, QUALITATIVE ([facility] ONLY)    ____________________________________________  EKG   ____________________________________________  RADIOLOGY   ____________________________________________   PROCEDURES   ____________________________________________   INITIAL IMPRESSION / ASSESSMENT AND PLAN / ED COURSE  Pertinent labs & imaging results that were available during my care of the patient were reviewed by me and considered in my medical decision making (see chart for details).  ----------------------------------------- 6:52 PM on 11/16/2014 -----------------------------------------  Patient resting comfortably and in no acute distress. Does not have any psychomotor agitation. No tremor at this time. No nausea or vomiting. Given [facility]  follow-up papers by the behavioral health intake, Jarrett Soho. Patient says he has a safe place to stay with his neighbor. Continues to deny any suicidal or homicidal ideation. We'll discharge and the patient will follow-up as an outpatient for alcohol detox. ____________________________________________   FINAL CLINICAL IMPRESSION(S) / ED DIAGNOSES Chronic alcoholism. Initial visit. Final diagnoses:  Alcoholism      Orbie Pyo, MD 11/16/14 [phone]  Additionally the patient continues to be clinically sober and ambulating without assistance and without signs of acute intoxication.  Orbie Pyo, MD 11/16/14 [phone]  Patient now no longer with option to go to neighbor's home. Will be kept overnight in the emergency department for social work consult for homelessness issues. Patient resting comfortable at this time.  Orbie Pyo, MD 11/16/14 2216

## 2014-11-16 NOTE — ED Notes (Signed)
RN attempted to call Lynnell Catalan, per pt, who is going to pick pt up. RN called cell and home phone with no success, primary RN Amy made aware and will attempt again momentarily.

## 2014-11-16 NOTE — ED Notes (Signed)

## 2014-11-16 NOTE — ED Notes (Signed)
Pt. transfered to BHU without incident after report from. Placed in room and oriented to unit. Pt. informed that for their safety all care areas are designed for safety and monitored by security cameras at all times; and visiting hours explained to patient. Patient verbalizes understanding, and verbal contract for safety obtained.   

## 2014-11-16 NOTE — ED Notes (Signed)
BEHAVIORAL HEALTH ROUNDING Patient sleeping: No. Patient alert and oriented: yes Behavior appropriate: Yes.  ; If no, describe:  Nutrition and fluids offered: Yes  Toileting and hygiene offered: Yes  Sitter present: yes Law enforcement present: Yes  

## 2014-11-16 NOTE — ED Notes (Signed)
Report called to raquel rn.  Pt ambulated to bhu.

## 2014-11-16 NOTE — ED Notes (Signed)
Pt calm  And cooperative at this time, no co but patient does have shakiness, med given per md order.  Will continue to monitor.

## 2014-11-16 NOTE — B[facility] Counselor (Signed)
Counselor assessed Pt. Substance use. Called [facility] who reports they do not have any current bed availability for a male patient. Pt has a long history of substance abuse and has been to several detox treatment programs in the past. Pt reports he does have a place to stay.  Counselor has given Pt information for [facility] and [facility].

## 2014-11-16 NOTE — Discharge Instructions (Signed)
Alcohol Use Disorder Alcohol use disorder is a mental disorder. It is not a one-time incident of heavy drinking. Alcohol use disorder is the excessive and uncontrollable use of alcohol over time that leads to problems with functioning in one or more areas of daily living. People with this disorder risk harming themselves and others when they drink to excess. Alcohol use disorder also can cause other mental disorders, such as mood and anxiety disorders, and serious physical problems. People with alcohol use disorder often misuse other drugs.  Alcohol use disorder is common and widespread. Some people with this disorder drink alcohol to cope with or escape from negative life events. Others drink to relieve chronic pain or symptoms of mental illness. People with a family history of alcohol use disorder are at higher risk of losing control and using alcohol to excess.  SYMPTOMS  Signs and symptoms of alcohol use disorder may include the following:   Consumption ofalcohol inlarger amounts or over a longer period of time than intended.  Multiple unsuccessful attempts to cutdown or control alcohol use.   A great deal of time spent obtaining alcohol, using alcohol, or recovering from the effects of alcohol (hangover).  A strong desire or urge to use alcohol (cravings).   Continued use of alcohol despite problems at work, school, or home because of alcohol use.   Continued use of alcohol despite problems in relationships because of alcohol use.  Continued use of alcohol in situations when it is physically hazardous, such as driving a car.  Continued use of alcohol despite awareness of a physical or psychological problem that is likely related to alcohol use. Physical problems related to alcohol use can involve the brain, heart, liver, stomach, and intestines. Psychological problems related to alcohol use include intoxication, depression, anxiety, psychosis, delirium, and dementia.   The need for  increased amounts of alcohol to achieve the same desired effect, or a decreased effect from the consumption of the same amount of alcohol (tolerance).  Withdrawal symptoms upon reducing or stopping alcohol use, or alcohol use to reduce or avoid withdrawal symptoms. Withdrawal symptoms include:  Racing heart.  Hand tremor.  Difficulty sleeping.  Nausea.  Vomiting.  Hallucinations.  Restlessness.  Seizures. DIAGNOSIS Alcohol use disorder is diagnosed through an assessment by your health care provider. Your health care provider may start by asking three or four questions to screen for excessive or problematic alcohol use. To confirm a diagnosis of alcohol use disorder, at least two symptoms must be present within a 12-month period. The severity of alcohol use disorder depends on the number of symptoms:  Mild--two or three.  Moderate--four or five.  Severe--six or more. Your health care provider may perform a physical exam or use results from lab tests to see if you have physical problems resulting from alcohol use. Your health care provider may refer you to a mental health professional for evaluation. TREATMENT  Some people with alcohol use disorder are able to reduce their alcohol use to low-risk levels. Some people with alcohol use disorder need to quit drinking alcohol. When necessary, mental health professionals with specialized training in substance use treatment can help. Your health care provider can help you decide how severe your alcohol use disorder is and what type of treatment you need. The following forms of treatment are available:   Detoxification. Detoxification involves the use of prescription medicines to prevent alcohol withdrawal symptoms in the first week after quitting. This is important for people with a history of symptoms   of withdrawal and for heavy drinkers who are likely to have withdrawal symptoms. Alcohol withdrawal can be dangerous and, in severe cases, cause  death. Detoxification is usually provided in a hospital or in-patient substance use treatment facility.  Counseling or talk therapy. Talk therapy is provided by substance use treatment counselors. It addresses the reasons people use alcohol and ways to keep them from drinking again. The goals of talk therapy are to help people with alcohol use disorder find healthy activities and ways to cope with life stress, to identify and avoid triggers for alcohol use, and to handle cravings, which can cause relapse.  Medicines.Different medicines can help treat alcohol use disorder through the following actions:  Decrease alcohol cravings.  Decrease the positive reward response felt from alcohol use.  Produce an uncomfortable physical reaction when alcohol is used (aversion therapy).  Support groups. Support groups are run by people who have quit drinking. They provide emotional support, advice, and guidance. These forms of treatment are often combined. Some people with alcohol use disorder benefit from intensive combination treatment provided by specialized substance use treatment centers. Both inpatient and outpatient treatment programs are available. Document Released: 04/12/2004 Document Revised: 07/20/2013 Document Reviewed: 06/12/2012 Exit[employer] Patient Information 2015 [employer]. This information is not intended to replace advice given to you by your health care provider. Make sure you discuss any questions you have with your health care provider.  

## 2014-11-16 NOTE — ED Notes (Signed)
Patient presented to ED voluntarily for help with ETOH abuse. Mood is depressed. Denies SI or HI. No evidence of psychosis. Last drink was 25 minutes PTA. Currently not experiencing any s/s withdrawal. No history of DTs or seizures. Patient placed on 15 minute checks for safety. Will initiate CIWA.

## 2014-11-16 NOTE — ED Notes (Signed)
Pt here voluntary for detox from alcohol. Pt drinks about 5 , 40oz beers per day since he was 40yrs old. Brought in by a friend b/c she was concerned about him. He told her he was going to sleep in the woods. Pt recently lived at [facility].  Pt denies any thoughts of SI or HI.

## 2014-11-16 NOTE — ED Notes (Signed)
Report received from Amy RN pt moved to ed bhu 1. Patient care assumed. Patient/RN introduction complete. Will continue to monitor.

## 2014-11-16 NOTE — ED Notes (Signed)
Mcarthur Rossetti called back to say she would not be coming to pick up pt.  Pt is homeless and she was a concerned friend who brought pt to the ER.  Charge nurse andrea rn aware. Pt sitting on strectcher.  Pt is calm and cooperative.

## 2014-11-16 NOTE — ED Notes (Signed)
ED BHU PLACEMENT JUSTIFICATION Is the patient under IVC or is there intent for IVC: No. Is the patient medically cleared: Yes.   Is there vacancy in the ED BHU: Yes.   Is the population mix appropriate for patient: Yes.   Is the patient awaiting placement in inpatient or outpatient setting: Yes.   Has the patient had a psychiatric consult: Yes.   Survey of unit performed for contraband, proper placement and condition of furniture, tampering with fixtures in bathroom, shower, and each patient room: Yes.  ; Findings:  APPEARANCE/BEHAVIOR calm, cooperative and adequate rapport can be established NEURO ASSESSMENT Orientation: time, place and person Hallucinations: No.None noted (Hallucinations) Speech: Normal Gait: normal RESPIRATORY ASSESSMENT Normal expansion.  Clear to auscultation.  No rales, rhonchi, or wheezing. CARDIOVASCULAR ASSESSMENT regular rate and rhythm, S1, S2 normal, no murmur, click, rub or gallop GASTROINTESTINAL ASSESSMENT soft, nontender, BS WNL, no r/g EXTREMITIES normal strength, tone, and muscle mass PLAN OF CARE Provide calm/safe environment. Vital signs assessed twice daily. ED BHU Assessment once each 12-hour shift. Collaborate with intake RN daily or as condition indicates. Assure the ED provider has rounded once each shift. Provide and encourage hygiene. Provide redirection as needed. Assess for escalating behavior; address immediately and inform ED provider.  Assess family dynamic and appropriateness for visitation as needed: Yes.  ; If necessary, describe findings:  Educate the patient/family about BHU procedures/visitation: Yes.  ; If necessary, describe findings:  

## 2014-11-16 NOTE — ED Notes (Signed)
Adam Good will come pick pt up per conversation via telephone with rn.  [phone] Home [phone]

## 2014-11-16 NOTE — ED Notes (Signed)
BEHAVIORAL HEALTH ROUNDING Patient sleeping: no Patient alert and oriented: yes Behavior appropriate: Yes.  ; If no, describe:  Nutrition and fluids offered: Yes  Toileting and hygiene offered: Yes  Sitter present: no Law enforcement present: Yes  

## 2014-11-17 DIAGNOSIS — I10 Essential (primary) hypertension: Secondary | ICD-10-CM | POA: Diagnosis not present

## 2014-11-17 DIAGNOSIS — R0602 Shortness of breath: Secondary | ICD-10-CM | POA: Diagnosis not present

## 2014-11-17 DIAGNOSIS — Z8249 Family history of ischemic heart disease and other diseases of the circulatory system: Secondary | ICD-10-CM | POA: Diagnosis not present

## 2014-11-17 DIAGNOSIS — E780 Pure hypercholesterolemia: Secondary | ICD-10-CM | POA: Diagnosis not present

## 2014-11-17 DIAGNOSIS — I517 Cardiomegaly: Secondary | ICD-10-CM | POA: Diagnosis not present

## 2014-11-17 DIAGNOSIS — T464X5A Adverse effect of angiotensin-converting-enzyme inhibitors, initial encounter: Secondary | ICD-10-CM | POA: Diagnosis not present

## 2014-11-17 DIAGNOSIS — R944 Abnormal results of kidney function studies: Secondary | ICD-10-CM | POA: Diagnosis not present

## 2014-11-17 DIAGNOSIS — Z720 Tobacco use: Secondary | ICD-10-CM | POA: Diagnosis not present

## 2014-11-17 DIAGNOSIS — R0989 Other specified symptoms and signs involving the circulatory and respiratory systems: Secondary | ICD-10-CM | POA: Diagnosis not present

## 2014-11-17 DIAGNOSIS — F17210 Nicotine dependence, cigarettes, uncomplicated: Secondary | ICD-10-CM | POA: Diagnosis not present

## 2014-11-17 DIAGNOSIS — F17200 Nicotine dependence, unspecified, uncomplicated: Secondary | ICD-10-CM | POA: Diagnosis not present

## 2014-11-17 DIAGNOSIS — I361 Nonrheumatic tricuspid (valve) insufficiency: Secondary | ICD-10-CM | POA: Diagnosis not present

## 2014-11-17 DIAGNOSIS — I952 Hypotension due to drugs: Secondary | ICD-10-CM | POA: Diagnosis not present

## 2014-11-17 DIAGNOSIS — R531 Weakness: Secondary | ICD-10-CM | POA: Diagnosis not present

## 2014-11-17 DIAGNOSIS — I350 Nonrheumatic aortic (valve) stenosis: Secondary | ICD-10-CM | POA: Diagnosis not present

## 2014-11-17 DIAGNOSIS — T428X5A Adverse effect of antiparkinsonism drugs and other central muscle-tone depressants, initial encounter: Secondary | ICD-10-CM | POA: Diagnosis not present

## 2014-11-17 DIAGNOSIS — I959 Hypotension, unspecified: Secondary | ICD-10-CM | POA: Diagnosis not present

## 2014-11-17 DIAGNOSIS — F1010 Alcohol abuse, uncomplicated: Secondary | ICD-10-CM | POA: Diagnosis not present

## 2014-11-17 DIAGNOSIS — R001 Bradycardia, unspecified: Secondary | ICD-10-CM | POA: Diagnosis not present

## 2014-11-17 DIAGNOSIS — I2510 Atherosclerotic heart disease of native coronary artery without angina pectoris: Secondary | ICD-10-CM | POA: Diagnosis not present

## 2014-11-17 DIAGNOSIS — F17220 Nicotine dependence, chewing tobacco, uncomplicated: Secondary | ICD-10-CM | POA: Diagnosis not present

## 2014-11-17 DIAGNOSIS — I214 Non-ST elevation (NSTEMI) myocardial infarction: Secondary | ICD-10-CM | POA: Diagnosis not present

## 2014-11-17 DIAGNOSIS — R7989 Other specified abnormal findings of blood chemistry: Secondary | ICD-10-CM | POA: Diagnosis not present

## 2014-11-17 DIAGNOSIS — J45909 Unspecified asthma, uncomplicated: Secondary | ICD-10-CM | POA: Diagnosis not present

## 2014-11-17 DIAGNOSIS — Z79899 Other long term (current) drug therapy: Secondary | ICD-10-CM | POA: Diagnosis not present

## 2014-11-17 DIAGNOSIS — J9811 Atelectasis: Secondary | ICD-10-CM | POA: Diagnosis not present

## 2014-11-17 DIAGNOSIS — I083 Combined rheumatic disorders of mitral, aortic and tricuspid valves: Secondary | ICD-10-CM | POA: Diagnosis not present

## 2014-11-17 DIAGNOSIS — R079 Chest pain, unspecified: Secondary | ICD-10-CM | POA: Diagnosis not present

## 2014-11-17 DIAGNOSIS — T465X5A Adverse effect of other antihypertensive drugs, initial encounter: Secondary | ICD-10-CM | POA: Diagnosis not present

## 2014-11-17 DIAGNOSIS — E440 Moderate protein-calorie malnutrition: Secondary | ICD-10-CM | POA: Diagnosis not present

## 2014-11-17 DIAGNOSIS — I248 Other forms of acute ischemic heart disease: Secondary | ICD-10-CM | POA: Diagnosis not present

## 2014-11-17 DIAGNOSIS — I340 Nonrheumatic mitral (valve) insufficiency: Secondary | ICD-10-CM | POA: Diagnosis not present

## 2014-11-17 DIAGNOSIS — Z590 Homelessness: Secondary | ICD-10-CM | POA: Diagnosis not present

## 2014-11-17 DIAGNOSIS — F1020 Alcohol dependence, uncomplicated: Secondary | ICD-10-CM | POA: Diagnosis not present

## 2014-11-17 DIAGNOSIS — Z7982 Long term (current) use of aspirin: Secondary | ICD-10-CM | POA: Diagnosis not present

## 2014-11-17 DIAGNOSIS — Z6821 Body mass index (BMI) 21.0-21.9, adult: Secondary | ICD-10-CM | POA: Diagnosis not present

## 2014-11-17 HISTORY — DX: Non-ST elevation (NSTEMI) myocardial infarction: I21.4

## 2014-11-17 MED ORDER — LORAZEPAM 1 MG PO TABS
ORAL_TABLET | ORAL | Status: AC
  Administered 2014-11-17: 1 mg via ORAL
  Filled 2014-11-17: qty 1

## 2014-11-17 NOTE — ED Notes (Signed)

## 2014-11-17 NOTE — ED Notes (Signed)
No change in condition, will continue to monitor.  

## 2014-11-17 NOTE — ED Notes (Addendum)
BEHAVIORAL HEALTH ROUNDING Patient sleeping: Yes.   Patient alert and oriented: Yes Behavior appropriate: Yes.  ; If no, describe: Nutrition and fluids offered: Yes  Toileting and hygiene offered: Yes  Sitter present: no Law enforcement present: Yes

## 2014-11-17 NOTE — ED Notes (Signed)
Pt laying in bed.  

## 2014-11-17 NOTE — ED Notes (Signed)
[facility]'s [facility]. Discussed this with Education officer, museum and patient.

## 2014-11-17 NOTE — ED Notes (Signed)
No change 

## 2014-11-17 NOTE — ED Notes (Signed)
Pt taking a shower 

## 2014-11-17 NOTE — ED Notes (Signed)
Pt given breakfast tray. Vitals taken. 

## 2014-11-17 NOTE — Progress Notes (Signed)
LCSW met with patient. He reported he has a bit of problem with alcohol at this point. LCSW discussed [facility] program and he is agreeable to go once he is established at [employer]. LCSW went over and reviewed several community resources to assist this man and provided a suicide prevention handout. Reviewed other options  of care when he is in crisis.  Patient and [place] staff stated that Kandyce Rud will be by at 11:30 to pick him up if he is discharged.  Reviewed patient situation he is a [place] and in 2 days he will have his benefits check and a place to stay. He is requesting discharge once his ride arrives.  LCSW will consult with EDP to see if this will be possible.

## 2014-11-17 NOTE — Progress Notes (Signed)
LCSW was requested to interview patient and to provide patient with several resources in this community. LCSW met with patient and provided hand out for [facility], [facility],Medication Management handout, addition soup kitchen and shelter options and [place] Engineer, technical sales for [employer].  Staff in [place] stated he has transportation. LCSW will inquire

## 2015-01-11 ENCOUNTER — Telehealth: Payer: Self-pay | Admitting: Cardiology

## 2015-01-11 NOTE — Telephone Encounter (Signed)
3 attempts made to schedule OD fu from recall.  LMOV to call office for scheduling  ° ° °Deleting Recall.   °

## 2015-05-24 ENCOUNTER — Emergency Department
Admission: EM | Admit: 2015-05-24 | Discharge: 2015-05-24 | Disposition: A | Discharge status: Home / Self Care | Payer: Medicare Other | Attending: Emergency Medicine | Admitting: Emergency Medicine

## 2015-05-24 ENCOUNTER — Emergency Department: Payer: Medicare Other

## 2015-05-24 DIAGNOSIS — R0789 Other chest pain: Secondary | ICD-10-CM | POA: Diagnosis not present

## 2015-05-24 DIAGNOSIS — Y9301 Activity, walking, marching and hiking: Secondary | ICD-10-CM | POA: Insufficient documentation

## 2015-05-24 DIAGNOSIS — Z79899 Other long term (current) drug therapy: Secondary | ICD-10-CM | POA: Insufficient documentation

## 2015-05-24 DIAGNOSIS — Y92480 Sidewalk as the place of occurrence of the external cause: Secondary | ICD-10-CM | POA: Insufficient documentation

## 2015-05-24 DIAGNOSIS — F10920 Alcohol use, unspecified with intoxication, uncomplicated: Secondary | ICD-10-CM

## 2015-05-24 DIAGNOSIS — W101XXA Fall (on)(from) sidewalk curb, initial encounter: Secondary | ICD-10-CM | POA: Insufficient documentation

## 2015-05-24 DIAGNOSIS — S29001A Unspecified injury of muscle and tendon of front wall of thorax, initial encounter: Secondary | ICD-10-CM | POA: Insufficient documentation

## 2015-05-24 DIAGNOSIS — F10120 Alcohol abuse with intoxication, uncomplicated: Secondary | ICD-10-CM | POA: Insufficient documentation

## 2015-05-24 DIAGNOSIS — I10 Essential (primary) hypertension: Secondary | ICD-10-CM | POA: Insufficient documentation

## 2015-05-24 DIAGNOSIS — Y907 Blood alcohol level of 200-239 mg/100 ml: Secondary | ICD-10-CM | POA: Diagnosis not present

## 2015-05-24 DIAGNOSIS — S299XXA Unspecified injury of thorax, initial encounter: Secondary | ICD-10-CM | POA: Diagnosis not present

## 2015-05-24 DIAGNOSIS — R079 Chest pain, unspecified: Secondary | ICD-10-CM | POA: Diagnosis not present

## 2015-05-24 DIAGNOSIS — Y998 Other external cause status: Secondary | ICD-10-CM | POA: Diagnosis not present

## 2015-05-24 DIAGNOSIS — F17210 Nicotine dependence, cigarettes, uncomplicated: Secondary | ICD-10-CM | POA: Diagnosis not present

## 2015-05-24 LAB — BASIC METABOLIC PANEL
Anion gap: 15 (ref 5–15)
BUN: 13 mg/dL (ref 6–20)
CALCIUM: 8.1 mg/dL — AB (ref 8.9–10.3)
CO2: 21 mmol/L — AB (ref 22–32)
CREATININE: 1.21 mg/dL (ref 0.61–1.24)
Chloride: 96 mmol/L — ABNORMAL LOW (ref 101–111)
GFR calc non Af Amer: 60 mL/min — ABNORMAL LOW (ref >60)
GLUCOSE: 72 mg/dL (ref 65–99)
Potassium: 3.7 mmol/L (ref 3.5–5.1)
Sodium: 132 mmol/L — ABNORMAL LOW (ref 135–145)

## 2015-05-24 LAB — HEPATIC FUNCTION PANEL
ALT: 25 U/L (ref 17–63)
AST: 39 U/L (ref 15–41)
Albumin: 3.8 g/dL (ref 3.5–5.0)
Alkaline Phosphatase: 59 U/L (ref 38–126)
BILIRUBIN DIRECT: 0.2 mg/dL (ref 0.1–0.5)
BILIRUBIN TOTAL: 0.5 mg/dL (ref 0.3–1.2)
Indirect Bilirubin: 0.3 mg/dL (ref 0.3–0.9)
Total Protein: 5.8 g/dL — ABNORMAL LOW (ref 6.5–8.1)

## 2015-05-24 LAB — TROPONIN I

## 2015-05-24 LAB — ETHANOL: Alcohol, Ethyl (B): 216 mg/dL — ABNORMAL HIGH (ref <5)

## 2015-05-24 LAB — CBC
HCT: 38.4 % — ABNORMAL LOW (ref 40.0–52.0)
Hemoglobin: 13.4 g/dL (ref 13.0–18.0)
MCH: 34.3 pg — AB (ref 26.0–34.0)
MCHC: 35 g/dL (ref 32.0–36.0)
MCV: 98 fL (ref 80.0–100.0)
PLATELETS: 202 10*3/uL (ref 150–440)
RBC: 3.92 MIL/uL — ABNORMAL LOW (ref 4.40–5.90)
RDW: 12.7 % (ref 11.5–14.5)
WBC: 8.7 10*3/uL (ref 3.8–10.6)

## 2015-05-24 LAB — LIPASE, BLOOD: LIPASE: 19 U/L (ref 11–51)

## 2015-05-24 MED ORDER — FAMOTIDINE 20 MG PO TABS
40.0000 mg | ORAL_TABLET | Freq: Once | ORAL | Status: AC
  Administered 2015-05-24: 40 mg via ORAL
  Filled 2015-05-24: qty 2

## 2015-05-24 MED ORDER — GI COCKTAIL ~~LOC~~
30.0000 mL | ORAL | Status: AC
Start: 2015-05-24 — End: 2015-05-24
  Administered 2015-05-24: 30 mL via ORAL
  Filled 2015-05-24: qty 30

## 2015-05-24 NOTE — Discharge Instructions (Signed)
Alcohol Intoxication Alcohol intoxication occurs when the amount of alcohol that a person has consumed impairs his or her ability to mentally and physically function. Alcohol directly impairs the normal chemical activity of the brain. Drinking large amounts of alcohol can lead to changes in mental function and behavior, and it can cause many physical effects that can be harmful.  Alcohol intoxication can range in severity from mild to very severe. Various factors can affect the level of intoxication that occurs, such as the person's age, gender, weight, frequency of alcohol consumption, and the presence of other medical conditions (such as diabetes, seizures, or heart conditions). Dangerous levels of alcohol intoxication may occur when people drink large amounts of alcohol in a short period (binge drinking). Alcohol can also be especially dangerous when combined with certain prescription medicines or "recreational" drugs. SIGNS AND SYMPTOMS Some common signs and symptoms of mild alcohol intoxication include:  Loss of coordination.  Changes in mood and behavior.  Impaired judgment.  Slurred speech. As alcohol intoxication progresses to more severe levels, other signs and symptoms will appear. These may include:  Vomiting.  Confusion and impaired memory.  Slowed breathing.  Seizures.  Loss of consciousness. DIAGNOSIS  Your health care provider will take a medical history and perform a physical exam. You will be asked about the amount and type of alcohol you have consumed. Blood tests will be done to measure the concentration of alcohol in your blood. In many places, your blood alcohol level must be lower than 80 mg/dL (0.08%) to legally drive. However, many dangerous effects of alcohol can occur at much lower levels.  TREATMENT  People with alcohol intoxication often do not require treatment. Most of the effects of alcohol intoxication are temporary, and they go away as the alcohol naturally  leaves the body. Your health care provider will monitor your condition until you are stable enough to go home. Fluids are sometimes given through an IV access tube to help prevent dehydration.  HOME CARE INSTRUCTIONS  Do not drive after drinking alcohol.  Stay hydrated. Drink enough water and fluids to keep your urine clear or pale yellow. Avoid caffeine.   Only take over-the-counter or prescription medicines as directed by your health care provider.  SEEK MEDICAL CARE IF:   You have persistent vomiting.   You do not feel better after a few days.  You have frequent alcohol intoxication. Your health care provider can help determine if you should see a substance use treatment counselor. SEEK IMMEDIATE MEDICAL CARE IF:   You become shaky or tremble when you try to stop drinking.   You shake uncontrollably (seizure).   You throw up (vomit) blood. This may be bright red or may look like black coffee grounds.   You have blood in your stool. This may be bright red or may appear as a black, tarry, bad smelling stool.   You become lightheaded or faint.  MAKE SURE YOU:   Understand these instructions.  Will watch your condition.  Will get help right away if you are not doing well or get worse.   This information is not intended to replace advice given to you by your health care provider. Make sure you discuss any questions you have with your health care provider.   Document Released: 12/13/2004 Document Revised: 11/05/2012 Document Reviewed: 08/08/2012 Elsevier Interactive Patient Education 2016 [employer].  Alcohol Use Disorder Alcohol use disorder is a mental disorder. It is not a one-time incident of heavy drinking. Alcohol use  disorder is the excessive and uncontrollable use of alcohol over time that leads to problems with functioning in one or more areas of daily living. People with this disorder risk harming themselves and others when they drink to excess. Alcohol use  disorder also can cause other mental disorders, such as mood and anxiety disorders, and serious physical problems. People with alcohol use disorder often misuse other drugs.  Alcohol use disorder is common and widespread. Some people with this disorder drink alcohol to cope with or escape from negative life events. Others drink to relieve chronic pain or symptoms of mental illness. People with a family history of alcohol use disorder are at higher risk of losing control and using alcohol to excess.  Drinking too much alcohol can cause injury, accidents, and health problems. One drink can be too much when you are:  Working.  Pregnant or breastfeeding.  Taking medicines. Ask your doctor.  Driving or planning to drive. SYMPTOMS  Signs and symptoms of alcohol use disorder may include the following:   Consumption ofalcohol inlarger amounts or over a longer period of time than intended.  Multiple unsuccessful attempts to cutdown or control alcohol use.   A great deal of time spent obtaining alcohol, using alcohol, or recovering from the effects of alcohol (hangover).  A strong desire or urge to use alcohol (cravings).   Continued use of alcohol despite problems at work, school, or home because of alcohol use.   Continued use of alcohol despite problems in relationships because of alcohol use.  Continued use of alcohol in situations when it is physically hazardous, such as driving a car.  Continued use of alcohol despite awareness of a physical or psychological problem that is likely related to alcohol use. Physical problems related to alcohol use can involve the brain, heart, liver, stomach, and intestines. Psychological problems related to alcohol use include intoxication, depression, anxiety, psychosis, delirium, and dementia.   The need for increased amounts of alcohol to achieve the same desired effect, or a decreased effect from the consumption of the same amount of alcohol  (tolerance).  Withdrawal symptoms upon reducing or stopping alcohol use, or alcohol use to reduce or avoid withdrawal symptoms. Withdrawal symptoms include:  Racing heart.  Hand tremor.  Difficulty sleeping.  Nausea.  Vomiting.  Hallucinations.  Restlessness.  Seizures. DIAGNOSIS Alcohol use disorder is diagnosed through an assessment by your health care provider. Your health care provider may start by asking three or four questions to screen for excessive or problematic alcohol use. To confirm a diagnosis of alcohol use disorder, at least two symptoms must be present within a 62-month period. The severity of alcohol use disorder depends on the number of symptoms:  Mild--two or three.  Moderate--four or five.  Severe--six or more. Your health care provider may perform a physical exam or use results from lab tests to see if you have physical problems resulting from alcohol use. Your health care provider may refer you to a mental health professional for evaluation. TREATMENT  Some people with alcohol use disorder are able to reduce their alcohol use to low-risk levels. Some people with alcohol use disorder need to quit drinking alcohol. When necessary, mental health professionals with specialized training in substance use treatment can help. Your health care provider can help you decide how severe your alcohol use disorder is and what type of treatment you need. The following forms of treatment are available:   Detoxification. Detoxification involves the use of prescription medicines to prevent alcohol  withdrawal symptoms in the first week after quitting. This is important for people with a history of symptoms of withdrawal and for heavy drinkers who are likely to have withdrawal symptoms. Alcohol withdrawal can be dangerous and, in severe cases, cause death. Detoxification is usually provided in a hospital or in-patient substance use treatment facility.  Counseling or talk therapy.  Talk therapy is provided by substance use treatment counselors. It addresses the reasons people use alcohol and ways to keep them from drinking again. The goals of talk therapy are to help people with alcohol use disorder find healthy activities and ways to cope with life stress, to identify and avoid triggers for alcohol use, and to handle cravings, which can cause relapse.  Medicines.Different medicines can help treat alcohol use disorder through the following actions:  Decrease alcohol cravings.  Decrease the positive reward response felt from alcohol use.  Produce an uncomfortable physical reaction when alcohol is used (aversion therapy).  Support groups. Support groups are run by people who have quit drinking. They provide emotional support, advice, and guidance. These forms of treatment are often combined. Some people with alcohol use disorder benefit from intensive combination treatment provided by specialized substance use treatment centers. Both inpatient and outpatient treatment programs are available.   This information is not intended to replace advice given to you by your health care provider. Make sure you discuss any questions you have with your health care provider.   Document Released: 04/12/2004 Document Revised: 03/26/2014 Document Reviewed: 06/12/2012 Elsevier Interactive Patient Education 2016 Elsevier Inc.  Nonspecific Chest Pain  Chest pain can be caused by many different conditions. There is always a chance that your pain could be related to something serious, such as a heart attack or a blood clot in your lungs. Chest pain can also be caused by conditions that are not life-threatening. If you have chest pain, it is very important to follow up with your health care provider. CAUSES  Chest pain can be caused by:  Heartburn.  Pneumonia or bronchitis.  Anxiety or stress.  Inflammation around your heart (pericarditis) or lung (pleuritis or pleurisy).  A blood clot  in your lung.  A collapsed lung (pneumothorax). It can develop suddenly on its own (spontaneous pneumothorax) or from trauma to the chest.  Shingles infection (varicella-zoster virus).  Heart attack.  Damage to the bones, muscles, and cartilage that make up your chest wall. This can include:  Bruised bones due to injury.  Strained muscles or cartilage due to frequent or repeated coughing or overwork.  Fracture to one or more ribs.  Sore cartilage due to inflammation (costochondritis). RISK FACTORS  Risk factors for chest pain may include:  Activities that increase your risk for trauma or injury to your chest.  Respiratory infections or conditions that cause frequent coughing.  Medical conditions or overeating that can cause heartburn.  Heart disease or family history of heart disease.  Conditions or health behaviors that increase your risk of developing a blood clot.  Having had chicken pox (varicella zoster). SIGNS AND SYMPTOMS Chest pain can feel like:  Burning or tingling on the surface of your chest or deep in your chest.  Crushing, pressure, aching, or squeezing pain.  Dull or sharp pain that is worse when you move, cough, or take a deep breath.  Pain that is also felt in your back, neck, shoulder, or arm, or pain that spreads to any of these areas. Your chest pain may come and go, or it may stay  constant. DIAGNOSIS Lab tests or other studies may be needed to find the cause of your pain. Your health care provider may have you take a test called an ambulatory ECG (electrocardiogram). An ECG records your heartbeat patterns at the time the test is performed. You may also have other tests, such as:  Transthoracic echocardiogram (TTE). During echocardiography, sound waves are used to create a picture of all of the heart structures and to look at how blood flows through your heart.  Transesophageal echocardiogram (TEE).This is a more advanced imaging test that obtains  images from inside your body. It allows your health care provider to see your heart in finer detail.  Cardiac monitoring. This allows your health care provider to monitor your heart rate and rhythm in real time.  Holter monitor. This is a portable device that records your heartbeat and can help to diagnose abnormal heartbeats. It allows your health care provider to track your heart activity for several days, if needed.  Stress tests. These can be done through exercise or by taking medicine that makes your heart beat more quickly.  Blood tests.  Imaging tests. TREATMENT  Your treatment depends on what is causing your chest pain. Treatment may include:  Medicines. These may include:  Acid blockers for heartburn.  Anti-inflammatory medicine.  Pain medicine for inflammatory conditions.  Antibiotic medicine, if an infection is present.  Medicines to dissolve blood clots.  Medicines to treat coronary artery disease.  Supportive care for conditions that do not require medicines. This may include:  Resting.  Applying heat or cold packs to injured areas.  Limiting activities until pain decreases. HOME CARE INSTRUCTIONS  If you were prescribed an antibiotic medicine, finish it all even if you start to feel better.  Avoid any activities that bring on chest pain.  Do not use any tobacco products, including cigarettes, chewing tobacco, or electronic cigarettes. If you need help quitting, ask your health care provider.  Do not drink alcohol.  Take medicines only as directed by your health care provider.  Keep all follow-up visits as directed by your health care provider. This is important. This includes any further testing if your chest pain does not go away.  If heartburn is the cause for your chest pain, you may be told to keep your head raised (elevated) while sleeping. This reduces the chance that acid will go from your stomach into your esophagus.  Make lifestyle changes as  directed by your health care provider. These may include:  Getting regular exercise. Ask your health care provider to suggest some activities that are safe for you.  Eating a heart-healthy diet. A registered dietitian can help you to learn healthy eating options.  Maintaining a healthy weight.  Managing diabetes, if necessary.  Reducing stress. SEEK MEDICAL CARE IF:  Your chest pain does not go away after treatment.  You have a rash with blisters on your chest.  You have a fever. SEEK IMMEDIATE MEDICAL CARE IF:   Your chest pain is worse.  You have an increasing cough, or you cough up blood.  You have severe abdominal pain.  You have severe weakness.  You faint.  You have chills.  You have sudden, unexplained chest discomfort.  You have sudden, unexplained discomfort in your arms, back, neck, or jaw.  You have shortness of breath at any time.  You suddenly start to sweat, or your skin gets clammy.  You feel nauseous or you vomit.  You suddenly feel light-headed or dizzy.  Your heart begins to beat quickly, or it feels like it is skipping beats. These symptoms may represent a serious problem that is an emergency. Do not wait to see if the symptoms will go away. Get medical help right away. Call your local emergency services (911 in the [place] Do not drive yourself to the hospital.   This information is not intended to replace advice given to you by your health care provider. Make sure you discuss any questions you have with your health care provider.   Document Released: 12/13/2004 Document Revised: 03/26/2014 Document Reviewed: 10/09/2013 Elsevier Interactive Patient Education [employer].

## 2015-05-24 NOTE — ED Notes (Signed)
Pts speech slurred.  Pt sts that he only had 1 40 oz beer today but normally drinks 2.  Pt sts he is supposed to have SL nitro but did not have them.

## 2015-05-24 NOTE — ED Notes (Signed)
Lab called about lack of results

## 2015-05-24 NOTE — ED Provider Notes (Signed)
[facility] Emergency Department Provider Note  ____________________________________________  Time seen: 1:00 PM on arrival by EMS  I have reviewed the triage vital signs and the nursing notes.   HISTORY  Chief Complaint Fall    HPI Adam Good is a 70 y.o. male who reports a fall. He had just come out of [employer] today and was walking down the sidewalk when he suddenly fell down. He denies syncope. He states that he normally takes 240s of beer daily and today he's only had 140 of beer. Denies any seizure or hallucinations. Has otherwise been in his usual state of health. No shortness of breath fever nausea vomiting but he does complain of some chest pain. The chest pain is constant, tightness, nonradiating. No shortness of breath vomiting or diaphoresis. Not exertional, not pleuritic. No aggravating or alleviating factors. Moderate intensity. EMS gave nitroglycerin 3 which did not significantly affect his pain but did decrease his blood pressure to 80/50. Patient reports compliance with his medications which is amlodipine, HCTZ, and Crestor.     Past Medical History  Diagnosis Date  . Bipolar disorder ([place])   . Gastroesophageal reflux disease   . Essential hypertension   . Alcohol abuse   . Heart murmur     Not heard on exam  . Demand ischemia of myocardium [facility]) Nov 2015    Positive troponins in the setting of a fall during intoxication; Myoview February 2016: EF 65-70%, no evidence of ischemia or infarction. No RWMA.  . Dyslipidemia     On statin: FLP 12/2013: TC 143, TG 144, HDL 45, LDL 69  . Sleep apnea      Patient Active Problem List   Diagnosis Date Noted  . Dyslipidemia   . Essential hypertension   . Alcohol abuse   . Heart murmur   . Demand ischemia of myocardium ([place]) 01/17/2014     Past Surgical History  Procedure Laterality Date  . Hernia repair    . Knee surgery    . Nm myoview ltd  Jan 2009; 05/12/14    a) No  ischemia/infarct. Normal EF - 73%; b) EF 65-70%, no evidence of ischemia or infarction. No RWMA.     Current Outpatient Rx  Name  Route  Sig  Dispense  Refill  . albuterol (PROVENTIL HFA;VENTOLIN HFA) 108 (90 BASE) MCG/ACT inhaler   Inhalation   Inhale 2 puffs into the lungs every 4 (four) hours as needed for wheezing or shortness of breath.         Marland Kitchen amLODipine (NORVASC) 5 MG tablet   Oral   Take 5 mg by mouth daily.         . hydrochlorothiazide (HYDRODIURIL) 25 MG tablet   Oral   Take 25 mg by mouth daily.         Marland Kitchen lisinopril (PRINIVIL,ZESTRIL) 20 MG tablet   Oral   Take 20 mg by mouth daily.         . rosuvastatin (CRESTOR) 20 MG tablet   Oral   Take 20 mg by mouth at bedtime.            Allergies Review of patient's allergies indicates no known allergies.   Family History  Problem Relation Age of Onset  . Cancer Father     Social History Social History  Substance Use Topics  . Smoking status: Current Every Day Smoker -- 0.50 packs/day for 57 years    Types: Cigarettes  . Smokeless tobacco: Current User  Types: Snuff  . Alcohol Use: 0.0 oz/week    0 Standard drinks or equivalent per week     Comment: He has been a heavy alcohol drinker for > 30 yrs.  Stats that he quit 3 weeks ago.     Review of Systems  Constitutional:   No fever or chills. No weight changes Eyes:   No blurry vision or double vision.  ENT:   No sore throat.  Cardiovascular:   Positive as above chest pain. Respiratory:   No dyspnea or cough. Gastrointestinal:   Negative for abdominal pain, vomiting and diarrhea.  No BRBPR or melena. Genitourinary:   Negative for dysuria or difficulty urinating. Musculoskeletal:   Negative for back pain. No joint swelling or pain. Skin:   Negative for rash. Neurological:   Negative for headaches, focal weakness or numbness. Psychiatric:  No anxiety or depression.  Alcohol abuse Endocrine:  No changes in energy or sleep  difficulty.  10-point ROS otherwise negative.  ____________________________________________   PHYSICAL EXAM:  VITAL SIGNS: ED Triage Vitals  Enc Vitals Group     BP 05/24/15 1306 104/64 mmHg     Pulse Rate 05/24/15 1306 67     Resp 05/24/15 1306 16     Temp 05/24/15 1306 98.1 F (36.7 C)     Temp Source 05/24/15 1306 Oral     SpO2 05/24/15 1306 96 %     Weight 05/24/15 1306 145 lb (65.772 kg)     Height 05/24/15 1306 5\' 8"  (1.727 m)     Head Cir --      Peak Flow --      Pain Score 05/24/15 1307 7     Pain Loc --      Pain Edu? --      Excl. in [place]? --     Vital signs reviewed, nursing assessments reviewed.   Constitutional:   Alert and oriented. Well appearing and in no distress. Eyes:   No scleral icterus. No conjunctival pallor. PERRL. EOMI ENT   Head:   Normocephalic and atraumatic.   Nose:   No congestion/rhinnorhea. No septal hematoma   Mouth/Throat:   MMM, no pharyngeal erythema. No peritonsillar mass.    Neck:   No stridor. No SubQ emphysema. No meningismus. Hematological/Lymphatic/Immunilogical:   No cervical lymphadenopathy. Cardiovascular:   RRR. Symmetric bilateral radial and DP pulses.  No murmurs.  Respiratory:   Normal respiratory effort without tachypnea nor retractions. Breath sounds are clear and equal bilaterally. No wheezes/rales/rhonchi. Gastrointestinal:   Soft and nontender. Non distended. There is no CVA tenderness.  No rebound, rigidity, or guarding. Genitourinary:   deferred Musculoskeletal:   Nontender with normal range of motion in all extremities. No joint effusions.  No lower extremity tenderness.  No edema. Neurologic:   Slurred speech.  CN 2-10 normal. Motor grossly intact. No gross focal neurologic deficits are appreciated.  Skin:    Skin is warm, dry and intact. No rash noted.  No petechiae, purpura, or bullae. Psychiatric:   Mood and affect are normal. ____________________________________________    LABS (pertinent  positives/negatives) (all labs ordered are listed, but only abnormal results are displayed) Labs Reviewed  BASIC METABOLIC PANEL - Abnormal; Notable for the following:    Sodium 132 (*)    Chloride 96 (*)    CO2 21 (*)    Calcium 8.1 (*)    GFR calc non Af Amer 60 (*)    All other components within normal limits  CBC - Abnormal; Notable  for the following:    RBC 3.92 (*)    HCT 38.4 (*)    MCH 34.3 (*)    All other components within normal limits  ETHANOL - Abnormal; Notable for the following:    Alcohol, Ethyl (B) 216 (*)    All other components within normal limits  TROPONIN I  HEPATIC FUNCTION PANEL  LIPASE, BLOOD  TROPONIN I   ____________________________________________   EKG  Interpreted by me Sinus rhythm rate of 72, normal axis and intervals. Normal QRS. Normal ST segments. Diffuse T-wave inversions in anterior and lateral leads. These were present on previous EKG of 01/19/2014. No significant changes. Repeat EKG with V5 and V6 performed as right-sided leads does not show a right-sided STEMI.  ____________________________________________    RADIOLOGY    ____________________________________________   PROCEDURES   ____________________________________________   INITIAL IMPRESSION / ASSESSMENT AND PLAN / ED COURSE  Pertinent labs & imaging results that were available during my care of the patient were reviewed by me and considered in my medical decision making (see chart for details).  Patient presents with atypical chest pain in the setting of alcohol intoxication and was sounds like a mechanical fall. Patient flatly denies any syncope. No preceding symptoms prior to the fall. Minimal change in symptoms with nitroglycerin. We'll give antacids will checking 2 troponins and monitoring to clinical sobriety in the emergency department. Care the patient signed out to Dr. Archie Balboa at 3:30 PM with troponin and sobriety  pending     ____________________________________________   FINAL CLINICAL IMPRESSION(S) / ED DIAGNOSES  Final diagnoses:  Alcohol intoxication, uncomplicated ([place])  Nonspecific chest pain      Carrie Mew, MD 05/24/15 1517

## 2015-05-24 NOTE — ED Notes (Signed)
Pt bib EMS from sidewalk outside [facility] in [place].  Per EMS, pt fell and unable to state why fell. Per EMS, pt had 40 oz beer today and sts he has not eaten in 2 days. CBG 67.  Denies drug use.  Pt c/o R side CP.  EMS gave 324 ASA and nitro SL.  Pts BP went from 128/70 to 82/50 w/ nitro.  Bolus NS started BP 95/56.  Pt c/o SOB but sts that is his normal.  Pt alert.  NAD.

## 2015-06-04 ENCOUNTER — Emergency Department
Admission: EM | Admit: 2015-06-04 | Discharge: 2015-06-05 | Disposition: A | Discharge status: Home / Self Care | Payer: Medicare Other | Attending: Emergency Medicine | Admitting: Emergency Medicine

## 2015-06-04 ENCOUNTER — Encounter: Payer: Self-pay | Admitting: Emergency Medicine

## 2015-06-04 DIAGNOSIS — Y908 Blood alcohol level of 240 mg/100 ml or more: Secondary | ICD-10-CM | POA: Diagnosis not present

## 2015-06-04 DIAGNOSIS — I10 Essential (primary) hypertension: Secondary | ICD-10-CM | POA: Diagnosis not present

## 2015-06-04 DIAGNOSIS — F10120 Alcohol abuse with intoxication, uncomplicated: Secondary | ICD-10-CM | POA: Diagnosis not present

## 2015-06-04 DIAGNOSIS — Z5900 Homelessness unspecified: Secondary | ICD-10-CM

## 2015-06-04 DIAGNOSIS — F10129 Alcohol abuse with intoxication, unspecified: Secondary | ICD-10-CM | POA: Diagnosis not present

## 2015-06-04 DIAGNOSIS — F17210 Nicotine dependence, cigarettes, uncomplicated: Secondary | ICD-10-CM | POA: Insufficient documentation

## 2015-06-04 DIAGNOSIS — Z79899 Other long term (current) drug therapy: Secondary | ICD-10-CM | POA: Diagnosis not present

## 2015-06-04 DIAGNOSIS — F10920 Alcohol use, unspecified with intoxication, uncomplicated: Secondary | ICD-10-CM

## 2015-06-04 NOTE — ED Notes (Addendum)
Pt presents to ED with [place]. Pt states he would like help for his ETOH abuse. Pt asked if his last visit earlier this month was for the same reason and pt said he actually was homeless and has no place to go so that's why he came to the hospital. Pt states he was asleep outside when he was picked up by police. When asked if he has pain currently pt points to the right side of his chest and states he has "been hurting there for quite a bit". Pt states pain is reproducible with touch. But has no increased work of breathing or distress noted at this time.

## 2015-06-05 DIAGNOSIS — Z590 Homelessness: Secondary | ICD-10-CM | POA: Diagnosis not present

## 2015-06-05 LAB — ETHANOL: Alcohol, Ethyl (B): 321 mg/dL (ref <5)

## 2015-06-05 LAB — CBC WITH DIFFERENTIAL/PLATELET
Basophils Absolute: 0 10*3/uL (ref 0–0.1)
Basophils Relative: 1 %
Eosinophils Absolute: 0.1 10*3/uL (ref 0–0.7)
Eosinophils Relative: 2 %
HEMATOCRIT: 43.2 % (ref 40.0–52.0)
Hemoglobin: 14.8 g/dL (ref 13.0–18.0)
LYMPHS PCT: 45 %
Lymphs Abs: 2.9 10*3/uL (ref 1.0–3.6)
MCH: 34.3 pg — ABNORMAL HIGH (ref 26.0–34.0)
MCHC: 34.3 g/dL (ref 32.0–36.0)
MCV: 99.9 fL (ref 80.0–100.0)
MONO ABS: 0.5 10*3/uL (ref 0.2–1.0)
MONOS PCT: 8 %
NEUTROS ABS: 2.8 10*3/uL (ref 1.4–6.5)
Neutrophils Relative %: 44 %
Platelets: 158 10*3/uL (ref 150–440)
RBC: 4.32 MIL/uL — ABNORMAL LOW (ref 4.40–5.90)
RDW: 12.7 % (ref 11.5–14.5)
WBC: 6.4 10*3/uL (ref 3.8–10.6)

## 2015-06-05 LAB — COMPREHENSIVE METABOLIC PANEL
ALBUMIN: 4.5 g/dL (ref 3.5–5.0)
ALT: 23 U/L (ref 17–63)
AST: 45 U/L — AB (ref 15–41)
Alkaline Phosphatase: 74 U/L (ref 38–126)
Anion gap: 8 (ref 5–15)
BILIRUBIN TOTAL: 0.5 mg/dL (ref 0.3–1.2)
BUN: 13 mg/dL (ref 6–20)
CO2: 28 mmol/L (ref 22–32)
Calcium: 8.6 mg/dL — ABNORMAL LOW (ref 8.9–10.3)
Chloride: 104 mmol/L (ref 101–111)
Creatinine, Ser: 1.03 mg/dL (ref 0.61–1.24)
GFR calc Af Amer: >60 mL/min (ref >60)
GFR calc non Af Amer: >60 mL/min (ref >60)
GLUCOSE: 100 mg/dL — AB (ref 65–99)
POTASSIUM: 3.3 mmol/L — AB (ref 3.5–5.1)
Sodium: 140 mmol/L (ref 135–145)
TOTAL PROTEIN: 7.2 g/dL (ref 6.5–8.1)

## 2015-06-05 LAB — TROPONIN I: Troponin I: <0.03 ng/mL (ref <0.031)

## 2015-06-05 MED ORDER — SODIUM CHLORIDE 0.9 % IV BOLUS (SEPSIS)
500.0000 mL | Freq: Once | INTRAVENOUS | Status: AC
  Administered 2015-06-05: 500 mL via INTRAVENOUS

## 2015-06-05 MED ORDER — THIAMINE HCL 100 MG/ML IJ SOLN
100.0000 mg | Freq: Every day | INTRAMUSCULAR | Status: DC
  Administered 2015-06-05: 100 mg via INTRAVENOUS
  Filled 2015-06-05: qty 2

## 2015-06-05 MED ORDER — ONDANSETRON 4 MG PO TBDP
ORAL_TABLET | ORAL | Status: AC
  Administered 2015-06-05: 4 mg via ORAL
  Filled 2015-06-05: qty 1

## 2015-06-05 MED ORDER — ONDANSETRON 4 MG PO TBDP
4.0000 mg | ORAL_TABLET | Freq: Once | ORAL | Status: AC
  Administered 2015-06-05: 4 mg via ORAL

## 2015-06-05 MED ORDER — SODIUM CHLORIDE 0.9 % IV SOLN
1.0000 mg | Freq: Once | INTRAVENOUS | Status: AC
  Administered 2015-06-05: 1 mg via INTRAVENOUS
  Filled 2015-06-05: qty 0.2

## 2015-06-05 NOTE — ED Provider Notes (Addendum)
[facility] Emergency Department Provider Note  ____________________________________________   I have reviewed the triage vital signs and the nursing notes.   HISTORY  Chief Complaint No chief complaint on file.    HPI Adam Good is a 70 y.o. male with a history of homelessness and alcohol abuse. He states initially that he was here because he wanted rehabilitation and he told me he was actually here he needed a place to sleep and had no else to go. Patient denies chest pain to me. No other complaints. He states he back to sleep.  Past Medical History  Diagnosis Date  . Bipolar disorder ([place])   . Gastroesophageal reflux disease   . Essential hypertension   . Alcohol abuse   . Heart murmur     Not heard on exam  . Demand ischemia of myocardium [facility]) Nov 2015    Positive troponins in the setting of a fall during intoxication; Myoview February 2016: EF 65-70%, no evidence of ischemia or infarction. No RWMA.  . Dyslipidemia     On statin: FLP 12/2013: TC 143, TG 144, HDL 45, LDL 69  . Sleep apnea     Patient Active Problem List   Diagnosis Date Noted  . Dyslipidemia   . Essential hypertension   . Alcohol abuse   . Heart murmur   . Demand ischemia of myocardium ([place]) 01/17/2014    Past Surgical History  Procedure Laterality Date  . Hernia repair    . Knee surgery    . Nm myoview ltd  Jan 2009; 05/12/14    a) No ischemia/infarct. Normal EF - 73%; b) EF 65-70%, no evidence of ischemia or infarction. No RWMA.    Current Outpatient Rx  Name  Route  Sig  Dispense  Refill  . albuterol (PROVENTIL HFA;VENTOLIN HFA) 108 (90 BASE) MCG/ACT inhaler   Inhalation   Inhale 2 puffs into the lungs every 4 (four) hours as needed for wheezing or shortness of breath.         Marland Kitchen amLODipine (NORVASC) 5 MG tablet   Oral   Take 5 mg by mouth daily.         . hydrochlorothiazide (HYDRODIURIL) 25 MG tablet   Oral   Take 25 mg by mouth daily.         Marland Kitchen  lisinopril (PRINIVIL,ZESTRIL) 20 MG tablet   Oral   Take 20 mg by mouth daily.         . rosuvastatin (CRESTOR) 20 MG tablet   Oral   Take 20 mg by mouth at bedtime.           Allergies Review of patient's allergies indicates no known allergies.  Family History  Problem Relation Age of Onset  . Cancer Father     Social History Social History  Substance Use Topics  . Smoking status: Current Every Day Smoker -- 0.50 packs/day for 57 years    Types: Cigarettes  . Smokeless tobacco: Current User    Types: Snuff  . Alcohol Use: 0.0 oz/week    0 Standard drinks or equivalent per week     Comment: He has been a heavy alcohol drinker for > 30 yrs.  Stats that he quit 3 weeks ago.     Review of Systems Constitutional: No fever/chills Eyes: No visual changes. ENT: No sore throat. No stiff neck no neck pain Cardiovascular: Denies chest pain. Respiratory: Denies shortness of breath. Gastrointestinal:   no vomiting.  No diarrhea.  No  constipation. Genitourinary: Negative for dysuria. Musculoskeletal: Negative lower extremity swelling Skin: Negative for rash. Neurological: Negative for headaches, focal weakness or numbness. 10-point ROS otherwise negative.  ____________________________________________   PHYSICAL EXAM:  VITAL SIGNS: ED Triage Vitals  Enc Vitals Group     BP 06/04/15 2040 154/93 mmHg     Pulse Rate 06/04/15 2040 82     Resp 06/04/15 2040 18     Temp 06/04/15 2040 97.7 F (36.5 C)     Temp Source 06/04/15 2040 Oral     SpO2 06/04/15 2040 98 %     Weight 06/04/15 2040 145 lb (65.772 kg)     Height 06/04/15 2040 5\' 8"  (1.727 m)     Head Cir --      Peak Flow --      Pain Score 06/04/15 2042 8     Pain Loc --      Pain Edu? --      Excl. in [place]? --     Constitutional: Alert and oriented. Well appearing and in no acute distress. Eyes: Conjunctivae are normal. PERRL. EOMI. Head: Atraumatic. Nose: No congestion/rhinnorhea. Mouth/Throat: Mucous  membranes are moist.  Oropharynx non-erythematous. Neck: No stridor.   Nontender with no meningismus Cardiovascular: Normal rate, regular rhythm. Grossly normal heart sounds.  Good peripheral circulation. Respiratory: Normal respiratory effort.  No retractions. Lungs CTAB. Abdominal: Soft and nontender. No distention. No guarding no rebound Back:  There is no focal tenderness or step off there is no midline tenderness there are no lesions noted. there is no CVA tenderness Musculoskeletal: No lower extremity tenderness. No joint effusions, no DVT signs strong distal pulses no edema Neurologic:  Normal speech and language. No gross focal neurologic deficits are appreciated.  Skin:  Skin is warm, dry and intact. No rash noted. Psychiatric: Mood and affect are normal. Speech and behavior are normal.  ____________________________________________   LABS (all labs ordered are listed, but only abnormal results are displayed)  Labs Reviewed  COMPREHENSIVE METABOLIC PANEL - Abnormal; Notable for the following:    Potassium 3.3 (*)    Glucose, Bld 100 (*)    Calcium 8.6 (*)    AST 45 (*)    All other components within normal limits  CBC WITH DIFFERENTIAL/PLATELET - Abnormal; Notable for the following:    RBC 4.32 (*)    MCH 34.3 (*)    All other components within normal limits  ETHANOL - Abnormal; Notable for the following:    Alcohol, Ethyl (B) 321 (*)    All other components within normal limits  TROPONIN I  URINE DRUG SCREEN, QUALITATIVE ([facility] ONLY)   ____________________________________________  EKG  I personally interpreted any EKGs ordered by me or triage Sinus rhythm rate 79 bpm no acute ST elevation or depression nonspecific ST changes are noted consistent with prior, ____________________________________________  RADIOLOGY  I reviewed any imaging ordered by me or triage that were performed during my shift and, if possible, patient and/or family made aware of any abnormal  findings. ____________________________________________   PROCEDURES  Procedure(s) performed: None  Critical Care performed: None  ____________________________________________   INITIAL IMPRESSION / ASSESSMENT AND PLAN / ED COURSE  Pertinent labs & imaging results that were available during my care of the patient were reviewed by me and considered in my medical decision making (see chart for details).  Patient here to me he denied chest pain he did state he had some to the triage nurse her troponin was sent which was negative EKG  does not show any significant change, patient is acutely intoxicated with alcohol 321 which actually likely for him is not too high. He is able to talk to me with clear speech. The plan is when daylight we'll try to see if he can ambulate and try to discharge him to outpatient care.  ----------------------------------------- 7:01 AM on 06/05/2015 -----------------------------------------  Patient awake in no acute distress, he has no complaints of chest pain states he did not have chest pain, he is eager to go from the department. We will feed him and see if he can ambulate and if he can will discharge him. Signed out to dr. Corky Downs at the end of my shift.  ____________________________________________   FINAL CLINICAL IMPRESSION(S) / ED DIAGNOSES  Final diagnoses:  None      This chart was dictated using voice recognition software.  Despite best efforts to proofread,  errors can occur which can change meaning.     Schuyler Amor, MD 06/05/15 [id number]  Schuyler Amor, MD 06/05/15 [phone]

## 2015-06-05 NOTE — Discharge Instructions (Signed)
Alcohol Abuse and Nutrition °Alcohol abuse is any pattern of alcohol consumption that harms your health, relationships, or work. Alcohol abuse can affect how your body breaks down and absorbs nutrients from food by causing your liver to work abnormally. Additionally, many people who abuse alcohol do not eat enough carbohydrates, protein, fat, vitamins, and minerals. This can cause poor nutrition (malnutrition) and a lack of nutrients (nutrient deficiencies), which can lead to further complications. °Nutrients that are commonly lacking (deficient) among people who abuse alcohol include: °· Vitamins. °· Vitamin A. This is stored in your liver. It is important for your vision, metabolism, and ability to fight off infections (immunity). °· B vitamins. These include vitamins such as folate, thiamin, and niacin. These are important in new cell growth and maintenance. °· Vitamin C. This plays an important role in iron absorption, wound healing, and immunity. °· Vitamin D. This is produced by your liver, but you can also get vitamin D from food. Vitamin D is necessary for your body to absorb and use calcium. °· Minerals. °· Calcium. This is important for your bones and your heart and blood vessel (cardiovascular) function. °· Iron. This is important for blood, muscle, and nervous system functioning. °· Magnesium. This plays an important role in muscle and nerve function, and it helps to control blood sugar and blood pressure. °· Zinc. This is important for the normal function of your nervous system and digestive system (gastrointestinal tract). °Nutrition is an essential component of therapy for alcohol abuse. Your health care provider or dietitian will work with you to design a plan that can help restore nutrients to your body and prevent potential complications. °WHAT IS MY PLAN? °Your dietitian may develop a specific diet plan that is based on your condition and any other complications you may have. A diet plan will  commonly include: °· A balanced diet. °¨ Grains: 6-8 oz per day. °¨ Vegetables: 2-3 cups per day. °¨ Fruits: 1-2 cups per day. °¨ Meat and other protein: 5-6 oz per day. °¨ Dairy: 2-3 cups per day. °· Vitamin and mineral supplements. °WHAT DO I NEED TO KNOW ABOUT ALCOHOL AND NUTRITION? °· Consume foods that are high in antioxidants, such as grapes, berries, nuts, green tea, and dark green and orange vegetables. This can help to counteract some of the stress that is placed on your liver by consuming alcohol. °· Avoid food and drinks that are high in fat and sugar. Foods such as sugared soft drinks, salty snack foods, and candy contain empty calories. This means that they lack important nutrients such as protein, fiber, and vitamins. °· Eat frequent meals and snacks. Try to eat 5-6 small meals each day. °· Eat a variety of fresh fruits and vegetables each day. This will help you get plenty of water, fiber, and vitamins in your diet. °· Drink plenty of water and other clear fluids. Try to drink at least 48-64 oz (1.5-2 L) of water per day. °· If you are a vegetarian, eat a variety of protein-rich foods. Pair whole grains with plant-based proteins at meals and snacks to obtain the greatest nutrient benefit from your food. For example, eat rice with beans, put peanut butter on whole-grain toast, or eat oatmeal with sunflower seeds. °· Soak beans and whole grains overnight before cooking. This can help your body to absorb the nutrients more easily. °· Include foods fortified with vitamins and minerals in your diet. Commonly fortified foods include milk, orange juice, cereal, and bread. °· If you   are malnourished, your dietitian may recommend a high-protein, high-calorie diet. This may include:  2,000-3,000 calories (kilocalories) per day.  70-100 grams of protein per day.  Your health care provider may recommend a complete nutritional supplement beverage. This can help to restore calories, protein, and vitamins to  your body. Depending on your condition, you may be advised to consume this instead of or in addition to meals.  Limit your intake of caffeine. Replace drinks like coffee and black tea with decaffeinated coffee and herbal tea.  Eat a variety of foods that are high in omega fatty acids. These include fish, nuts and seeds, and soybeans. These foods may help your liver to recover and may also stabilize your mood.  Certain medicines may cause changes in your appetite, taste, and weight. Work with your health care provider and dietitian to make any adjustments to your medicines and diet plan.  Include other healthy lifestyle choices in your daily routine.  Be physically active.  Get enough sleep.  Spend time doing activities that you enjoy.  If you are unable to take in enough food and calories by mouth, your health care provider may recommend a feeding tube. This is a tube that passes through your nose and throat, directly into your stomach. Nutritional supplement beverages can be given to you through the feeding tube to help you get the nutrients you need.  Take vitamin or mineral supplements as recommended by your health care provider. WHAT FOODS CAN I EAT? Grains Enriched pasta. Enriched rice. Fortified whole-grain bread. Fortified whole-grain cereal. Barley. Brown rice. Quinoa. [place]. Vegetables All fresh, frozen, and canned vegetables. Spinach. Kale. Artichoke. Carrots. Winter squash and pumpkin. Sweet potatoes. Broccoli. Cabbage. Cucumbers. Tomatoes. Sweet peppers. Green beans. Peas. Corn. Fruits All fresh and frozen fruits. Berries. Grapes. Mango. Papaya. Guava. Cherries. Apples. Bananas. Peaches. Plums. Pineapple. Watermelon. Cantaloupe. Oranges. Avocado. Meats and Other Protein Sources Beef liver. Lean beef. Pork. Fresh and canned chicken. Fresh fish. Oysters. Sardines. Canned tuna. Shrimp. Eggs with yolks. Nuts and seeds. Peanut butter. Beans and lentils. Soybeans.  Tofu. Dairy Whole, low-fat, and nonfat milk. Whole, low-fat, and nonfat yogurt. Cottage cheese. Sour cream. Hard and soft cheeses. Beverages Water. Herbal tea. Decaffeinated coffee. Decaffeinated green tea. 100% fruit juice. 100% vegetable juice. Instant breakfast shakes. Condiments Ketchup. Mayonnaise. Mustard. Salad dressing. Barbecue sauce. Sweets and Desserts Sugar-free ice cream. Sugar-free pudding. Sugar-free gelatin. Fats and Oils Butter. Vegetable oil, flaxseed oil, olive oil, and walnut oil. Other Complete nutrition shakes. Protein bars. Sugar-free gum. The items listed above may not be a complete list of recommended foods or beverages. Contact your dietitian for more options. WHAT FOODS ARE NOT RECOMMENDED? Grains Sugar-sweetened breakfast cereals. Flavored instant oatmeal. Fried breads. Vegetables Breaded or deep-fried vegetables. Fruits Dried fruit with added sugar. Candied fruit. Canned fruit in syrup. Meats and Other Protein Sources Breaded or deep-fried meats. Dairy Flavored milks. Fried cheese curds or fried cheese sticks. Beverages Alcohol. Sugar-sweetened soft drinks. Sugar-sweetened tea. Caffeinated coffee and tea. Condiments Sugar. Honey. Agave nectar. Molasses. Sweets and Desserts Chocolate. Cake. Cookies. Candy. Other Potato chips. Pretzels. Salted nuts. Candied nuts. The items listed above may not be a complete list of foods and beverages to avoid. Contact your dietitian for more information.   This information is not intended to replace advice given to you by your health care provider. Make sure you discuss any questions you have with your health care provider.   Document Released: 12/28/2004 Document Revised: 03/26/2014 Document Reviewed: 10/06/2013 Elsevier Interactive Patient  Education 2016 [employer].  Alcohol Intoxication Alcohol intoxication occurs when you drink enough alcohol that it affects your ability to function. It can be mild or very  severe. Drinking a lot of alcohol in a short time is called binge drinking. This can be very harmful. Drinking alcohol can also be more dangerous if you are taking medicines or other drugs. Some of the effects caused by alcohol may include:  Loss of coordination.  Changes in mood and behavior.  Unclear thinking.  Trouble talking (slurred speech).  Throwing up (vomiting).  Confusion.  Slowed breathing.  Twitching and shaking (seizures).  Loss of consciousness. HOME CARE  Do not drive after drinking alcohol.  Drink enough water and fluids to keep your pee (urine) clear or pale yellow. Avoid caffeine.  Only take medicine as told by your doctor. GET HELP IF:  You throw up (vomit) many times.  You do not feel better after a few days.  You frequently have alcohol intoxication. Your doctor can help decide if you should see a substance use treatment counselor. GET HELP RIGHT AWAY IF:  You become shaky when you stop drinking.  You have twitching and shaking.  You throw up blood. It may look bright red or like coffee grounds.  You notice blood in your poop (bowel movements).  You become lightheaded or pass out (faint). MAKE SURE YOU:   Understand these instructions.  Will watch your condition.  Will get help right away if you are not doing well or get worse.   This information is not intended to replace advice given to you by your health care provider. Make sure you discuss any questions you have with your health care provider.   Document Released: 08/22/2007 Document Revised: 11/05/2012 Document Reviewed: 08/08/2012 Elsevier Interactive Patient Education 2016 [employer].  Alcohol Use Disorder Alcohol use disorder is a mental disorder. It is not a one-time incident of heavy drinking. Alcohol use disorder is the excessive and uncontrollable use of alcohol over time that leads to problems with functioning in one or more areas of daily living. People with this disorder  risk harming themselves and others when they drink to excess. Alcohol use disorder also can cause other mental disorders, such as mood and anxiety disorders, and serious physical problems. People with alcohol use disorder often misuse other drugs.  Alcohol use disorder is common and widespread. Some people with this disorder drink alcohol to cope with or escape from negative life events. Others drink to relieve chronic pain or symptoms of mental illness. People with a family history of alcohol use disorder are at higher risk of losing control and using alcohol to excess.  Drinking too much alcohol can cause injury, accidents, and health problems. One drink can be too much when you are:  Working.  Pregnant or breastfeeding.  Taking medicines. Ask your doctor.  Driving or planning to drive. SYMPTOMS  Signs and symptoms of alcohol use disorder may include the following:   Consumption ofalcohol inlarger amounts or over a longer period of time than intended.  Multiple unsuccessful attempts to cutdown or control alcohol use.   A great deal of time spent obtaining alcohol, using alcohol, or recovering from the effects of alcohol (hangover).  A strong desire or urge to use alcohol (cravings).   Continued use of alcohol despite problems at work, school, or home because of alcohol use.   Continued use of alcohol despite problems in relationships because of alcohol use.  Continued use of alcohol in  situations when it is physically hazardous, such as driving a car.  Continued use of alcohol despite awareness of a physical or psychological problem that is likely related to alcohol use. Physical problems related to alcohol use can involve the brain, heart, liver, stomach, and intestines. Psychological problems related to alcohol use include intoxication, depression, anxiety, psychosis, delirium, and dementia.   The need for increased amounts of alcohol to achieve the same desired effect, or a  decreased effect from the consumption of the same amount of alcohol (tolerance).  Withdrawal symptoms upon reducing or stopping alcohol use, or alcohol use to reduce or avoid withdrawal symptoms. Withdrawal symptoms include:  Racing heart.  Hand tremor.  Difficulty sleeping.  Nausea.  Vomiting.  Hallucinations.  Restlessness.  Seizures. DIAGNOSIS Alcohol use disorder is diagnosed through an assessment by your health care provider. Your health care provider may start by asking three or four questions to screen for excessive or problematic alcohol use. To confirm a diagnosis of alcohol use disorder, at least two symptoms must be present within a 71-month period. The severity of alcohol use disorder depends on the number of symptoms:  Mild--two or three.  Moderate--four or five.  Severe--six or more. Your health care provider may perform a physical exam or use results from lab tests to see if you have physical problems resulting from alcohol use. Your health care provider may refer you to a mental health professional for evaluation. TREATMENT  Some people with alcohol use disorder are able to reduce their alcohol use to low-risk levels. Some people with alcohol use disorder need to quit drinking alcohol. When necessary, mental health professionals with specialized training in substance use treatment can help. Your health care provider can help you decide how severe your alcohol use disorder is and what type of treatment you need. The following forms of treatment are available:   Detoxification. Detoxification involves the use of prescription medicines to prevent alcohol withdrawal symptoms in the first week after quitting. This is important for people with a history of symptoms of withdrawal and for heavy drinkers who are likely to have withdrawal symptoms. Alcohol withdrawal can be dangerous and, in severe cases, cause death. Detoxification is usually provided in a hospital or in-patient  substance use treatment facility.  Counseling or talk therapy. Talk therapy is provided by substance use treatment counselors. It addresses the reasons people use alcohol and ways to keep them from drinking again. The goals of talk therapy are to help people with alcohol use disorder find healthy activities and ways to cope with life stress, to identify and avoid triggers for alcohol use, and to handle cravings, which can cause relapse.  Medicines.Different medicines can help treat alcohol use disorder through the following actions:  Decrease alcohol cravings.  Decrease the positive reward response felt from alcohol use.  Produce an uncomfortable physical reaction when alcohol is used (aversion therapy).  Support groups. Support groups are run by people who have quit drinking. They provide emotional support, advice, and guidance. These forms of treatment are often combined. Some people with alcohol use disorder benefit from intensive combination treatment provided by specialized substance use treatment centers. Both inpatient and outpatient treatment programs are available.   This information is not intended to replace advice given to you by your health care provider. Make sure you discuss any questions you have with your health care provider.   Document Released: 04/12/2004 Document Revised: 03/26/2014 Document Reviewed: 06/12/2012 Elsevier Interactive Patient Education [employer].

## 2015-06-05 NOTE — ED Notes (Signed)
Nurse gives pt his breakfast and he becomes nauseated then vomits. Orders received for Zofran for nausea.

## 2015-06-05 NOTE — ED Notes (Signed)
Pt ambulatory getting dressed.

## 2015-06-05 NOTE — ED Notes (Signed)
Pt given

## 2015-06-05 NOTE — ED Notes (Signed)
Critical lab value received from [place] at 00:54.  Alcohol Level: 321  Dr. Burlene Arnt notified at 00:55.

## 2015-06-12 ENCOUNTER — Encounter: Payer: Self-pay | Admitting: Emergency Medicine

## 2015-06-12 ENCOUNTER — Emergency Department
Admission: EM | Admit: 2015-06-12 | Discharge: 2015-06-13 | Disposition: A | Discharge status: Other Acute Inpatient Hospital | Payer: Medicare Other | Attending: Emergency Medicine | Admitting: Emergency Medicine

## 2015-06-12 DIAGNOSIS — F10129 Alcohol abuse with intoxication, unspecified: Secondary | ICD-10-CM | POA: Diagnosis not present

## 2015-06-12 DIAGNOSIS — Z5900 Homelessness unspecified: Secondary | ICD-10-CM

## 2015-06-12 DIAGNOSIS — F17210 Nicotine dependence, cigarettes, uncomplicated: Secondary | ICD-10-CM | POA: Insufficient documentation

## 2015-06-12 DIAGNOSIS — I10 Essential (primary) hypertension: Secondary | ICD-10-CM | POA: Diagnosis not present

## 2015-06-12 DIAGNOSIS — F10920 Alcohol use, unspecified with intoxication, uncomplicated: Secondary | ICD-10-CM

## 2015-06-12 DIAGNOSIS — F10220 Alcohol dependence with intoxication, uncomplicated: Secondary | ICD-10-CM | POA: Insufficient documentation

## 2015-06-12 DIAGNOSIS — F10929 Alcohol use, unspecified with intoxication, unspecified: Secondary | ICD-10-CM | POA: Diagnosis not present

## 2015-06-12 LAB — CBC WITH DIFFERENTIAL/PLATELET
BASOS ABS: 0.1 10*3/uL (ref 0–0.1)
Basophils Relative: 1 %
EOS ABS: 0.1 10*3/uL (ref 0–0.7)
Eosinophils Relative: 2 %
HCT: 38.6 % — ABNORMAL LOW (ref 40.0–52.0)
HEMOGLOBIN: 13.3 g/dL (ref 13.0–18.0)
LYMPHS ABS: 2.5 10*3/uL (ref 1.0–3.6)
LYMPHS PCT: 39 %
MCH: 34.1 pg — AB (ref 26.0–34.0)
MCHC: 34.5 g/dL (ref 32.0–36.0)
MCV: 98.9 fL (ref 80.0–100.0)
Monocytes Absolute: 0.9 10*3/uL (ref 0.2–1.0)
Monocytes Relative: 14 %
NEUTROS PCT: 44 %
Neutro Abs: 2.8 10*3/uL (ref 1.4–6.5)
PLATELETS: 141 10*3/uL — AB (ref 150–440)
RBC: 3.91 MIL/uL — AB (ref 4.40–5.90)
RDW: 13.4 % (ref 11.5–14.5)
WBC: 6.3 10*3/uL (ref 3.8–10.6)

## 2015-06-12 LAB — URINE DRUG SCREEN, QUALITATIVE (ARMC ONLY)
Amphetamines, Ur Screen: NOT DETECTED
BARBITURATES, UR SCREEN: NOT DETECTED
BENZODIAZEPINE, UR SCRN: NOT DETECTED
CANNABINOID 50 NG, UR ~~LOC~~: NOT DETECTED
COCAINE METABOLITE, UR ~~LOC~~: NOT DETECTED
MDMA (Ecstasy)Ur Screen: NOT DETECTED
METHADONE SCREEN, URINE: NOT DETECTED
OPIATE, UR SCREEN: NOT DETECTED
PHENCYCLIDINE (PCP) UR S: NOT DETECTED
Tricyclic, Ur Screen: NOT DETECTED

## 2015-06-12 LAB — COMPREHENSIVE METABOLIC PANEL
ALK PHOS: 60 U/L (ref 38–126)
ALT: 36 U/L (ref 17–63)
AST: 46 U/L — AB (ref 15–41)
Albumin: 4.1 g/dL (ref 3.5–5.0)
Anion gap: 7 (ref 5–15)
BUN: 9 mg/dL (ref 6–20)
CALCIUM: 8.7 mg/dL — AB (ref 8.9–10.3)
CHLORIDE: 104 mmol/L (ref 101–111)
CO2: 24 mmol/L (ref 22–32)
CREATININE: 0.87 mg/dL (ref 0.61–1.24)
GFR calc non Af Amer: >60 mL/min (ref >60)
GLUCOSE: 105 mg/dL — AB (ref 65–99)
Potassium: 3.5 mmol/L (ref 3.5–5.1)
SODIUM: 135 mmol/L (ref 135–145)
Total Bilirubin: 0.5 mg/dL (ref 0.3–1.2)
Total Protein: 6.7 g/dL (ref 6.5–8.1)

## 2015-06-12 LAB — ETHANOL: Alcohol, Ethyl (B): 296 mg/dL — ABNORMAL HIGH (ref <5)

## 2015-06-12 NOTE — ED Notes (Signed)
Pt presents to ED via [facility] for detox. Per EMS pt was found in a field by a group of people whom tried waking pt up but were unsuccessful. Per EMS pt sitting in chair with fire department, (+) ETOH, pt has slurred speech. Pt reports is homeless and drinks "2 forty's a day."

## 2015-06-12 NOTE — ED Provider Notes (Signed)
[facility] Emergency Department Provider Note     Time seen: ----------------------------------------- 6:28 PM on 06/12/2015 -----------------------------------------    I have reviewed the triage vital signs and the nursing notes.   HISTORY  Chief Complaint No chief complaint on file.    HPI Adam Good is a 70 y.o. male who presents the ER with detox request. Patient states he drinks heavy alcohol and wants help stopping drinking. He is currently homeless, states he drinks 2, 40 ounce beers a day.He denies any complaints, denies any recent illness.   Past Medical History  Diagnosis Date  . Bipolar disorder ([place])   . Gastroesophageal reflux disease   . Essential hypertension   . Alcohol abuse   . Heart murmur     Not heard on exam  . Demand ischemia of myocardium [facility]) Nov 2015    Positive troponins in the setting of a fall during intoxication; Myoview February 2016: EF 65-70%, no evidence of ischemia or infarction. No RWMA.  . Dyslipidemia     On statin: FLP 12/2013: TC 143, TG 144, HDL 45, LDL 69  . Sleep apnea     Patient Active Problem List   Diagnosis Date Noted  . Dyslipidemia   . Essential hypertension   . Alcohol abuse   . Heart murmur   . Demand ischemia of myocardium ([place]) 01/17/2014    Past Surgical History  Procedure Laterality Date  . Hernia repair    . Knee surgery    . [facility] myoview ltd  Jan 2009; 05/12/14    a) No ischemia/infarct. Normal EF - 73%; b) EF 65-70%, no evidence of ischemia or infarction. No RWMA.    Allergies Review of patient's allergies indicates no known allergies.  Social History Social History  Substance Use Topics  . Smoking status: Current Every Day Smoker -- 0.50 packs/day for 57 years    Types: Cigarettes  . Smokeless tobacco: Current User    Types: Snuff  . Alcohol Use: 0.0 oz/week    0 Standard drinks or equivalent per week     Comment: He has been a heavy alcohol drinker for > 30  yrs.  Stats that he quit 3 weeks ago.     Review of Systems Constitutional: Negative for fever. Eyes: Negative for visual changes. ENT: Negative for sore throat. Cardiovascular: Negative for chest pain. Respiratory: Negative for shortness of breath. Gastrointestinal: Negative for abdominal pain, vomiting and diarrhea. Genitourinary: Negative for dysuria. Musculoskeletal: Negative for back pain. Skin: Negative for rash. Neurological: Negative for headaches, focal weakness or numbness. Psychiatric: Positive for chronic alcoholism, homelessness 10-point ROS otherwise negative.  ____________________________________________   PHYSICAL EXAM:  VITAL SIGNS: ED Triage Vitals  Enc Vitals Group     BP --      Pulse --      Resp --      Temp --      Temp src --      SpO2 --      Weight --      Height --      Head Cir --      Peak Flow --      Pain Score --      Pain Loc --      Pain Edu? --      Excl. in [place]? --     Constitutional: Slurred speech, alert, no acute distress. Disheveled appearance Eyes: Conjunctivae are normal. PERRL. Normal extraocular movements. ENT   Head: Normocephalic and atraumatic.  Nose: No congestion/rhinnorhea.   Mouth/Throat: Mucous membranes are moist.   Neck: No stridor. Cardiovascular: Normal rate, regular rhythm. Normal and symmetric distal pulses are present in all extremities. No murmurs, rubs, or gallops. Respiratory: Normal respiratory effort without tachypnea nor retractions. Breath sounds are clear and equal bilaterally. No wheezes/rales/rhonchi. Gastrointestinal: Soft and nontender. No distention. No abdominal bruits.  Musculoskeletal: Nontender with normal range of motion in all extremities. No joint effusions.  No lower extremity tenderness nor edema. Neurologic:  Normal speech and language. No gross focal neurologic deficits are appreciated.  Skin:  Skin is warm, dry and intact. No rash noted. Psychiatric: Mood and affect  are normal. Speech and behavior are normal. Patient exhibits appropriate insight and judgment. ____________________________________________  ED COURSE:  Pertinent labs & imaging results that were available during my care of the patient were reviewed by me and considered in my medical decision making (see chart for details). Patient with chronic alcoholism and homelessness. It is unclear if he desires detox or not. We'll check basic labs and reevaluate. ____________________________________________    LABS (pertinent positives/negatives)  Labs Reviewed  CBC WITH DIFFERENTIAL/PLATELET - Abnormal; Notable for the following:    RBC 3.91 (*)    HCT 38.6 (*)    MCH 34.1 (*)    Platelets 141 (*)    All other components within normal limits  COMPREHENSIVE METABOLIC PANEL - Abnormal; Notable for the following:    Glucose, Bld 105 (*)    Calcium 8.7 (*)    AST 46 (*)    All other components within normal limits  ETHANOL - Abnormal; Notable for the following:    Alcohol, Ethyl (B) 296 (*)    All other components within normal limits  URINE DRUG SCREEN, QUALITATIVE ([facility] ONLY)   ____________________________________________  FINAL ASSESSMENT AND PLAN  Chronic alcoholism with acute intoxication, homelessness  Plan: Patient with labs as dictated above. Patient is still very intoxicated, but medically stable for detox referral.   Earleen Newport, MD   Earleen Newport, MD 06/12/15 2003

## 2015-06-12 NOTE — ED Notes (Signed)
Turkey sandwich and water provided to patient

## 2015-06-12 NOTE — ED Notes (Signed)
Dr Williams at bedside 

## 2015-06-12 NOTE — ED Notes (Signed)
Assisted pt up to bathroom and back into bed.  

## 2015-06-13 DIAGNOSIS — F10220 Alcohol dependence with intoxication, uncomplicated: Secondary | ICD-10-CM | POA: Diagnosis not present

## 2015-06-13 LAB — ETHANOL: Alcohol, Ethyl (B): 41 mg/dL — ABNORMAL HIGH (ref <5)

## 2015-06-13 NOTE — ED Notes (Signed)
BEHAVIORAL HEALTH ROUNDING Patient sleeping: No. Patient alert and oriented: yes Behavior appropriate: Yes.  ; If no, describe:  Nutrition and fluids offered: yes Toileting and hygiene offered: Yes  Sitter present: q15 minute observations and security  monitoring Law enforcement present: Yes  ODS  

## 2015-06-13 NOTE — ED Notes (Signed)
Pt observed lying in hallway bed - appears to be sleeping   Even, unlabored respirations observed   NAD  I will continue to monitor along with every 15 minute visual observations and ongoing security monitoring

## 2015-06-13 NOTE — Discharge Instructions (Signed)
Alcohol Intoxication Alcohol intoxication occurs when the amount of alcohol that a person has consumed impairs his or her ability to mentally and physically function. Alcohol directly impairs the normal chemical activity of the brain. Drinking large amounts of alcohol can lead to changes in mental function and behavior, and it can cause many physical effects that can be harmful.  Alcohol intoxication can range in severity from mild to very severe. Various factors can affect the level of intoxication that occurs, such as the person's age, gender, weight, frequency of alcohol consumption, and the presence of other medical conditions (such as diabetes, seizures, or heart conditions). Dangerous levels of alcohol intoxication may occur when people drink large amounts of alcohol in a short period (binge drinking). Alcohol can also be especially dangerous when combined with certain prescription medicines or "recreational" drugs. SIGNS AND SYMPTOMS Some common signs and symptoms of mild alcohol intoxication include:  Loss of coordination.  Changes in mood and behavior.  Impaired judgment.  Slurred speech. As alcohol intoxication progresses to more severe levels, other signs and symptoms will appear. These may include:  Vomiting.  Confusion and impaired memory.  Slowed breathing.  Seizures.  Loss of consciousness. DIAGNOSIS  Your health care provider will take a medical history and perform a physical exam. You will be asked about the amount and type of alcohol you have consumed. Blood tests will be done to measure the concentration of alcohol in your blood. In many places, your blood alcohol level must be lower than 80 mg/dL (0.08%) to legally drive. However, many dangerous effects of alcohol can occur at much lower levels.  TREATMENT  People with alcohol intoxication often do not require treatment. Most of the effects of alcohol intoxication are temporary, and they go away as the alcohol naturally  leaves the body. Your health care provider will monitor your condition until you are stable enough to go home. Fluids are sometimes given through an IV access tube to help prevent dehydration.  HOME CARE INSTRUCTIONS  Do not drive after drinking alcohol.  Stay hydrated. Drink enough water and fluids to keep your urine clear or pale yellow. Avoid caffeine.   Only take over-the-counter or prescription medicines as directed by your health care provider.  SEEK MEDICAL CARE IF:   You have persistent vomiting.   You do not feel better after a few days.  You have frequent alcohol intoxication. Your health care provider can help determine if you should see a substance use treatment counselor. SEEK IMMEDIATE MEDICAL CARE IF:   You become shaky or tremble when you try to stop drinking.   You shake uncontrollably (seizure).   You throw up (vomit) blood. This may be bright red or may look like black coffee grounds.   You have blood in your stool. This may be bright red or may appear as a black, tarry, bad smelling stool.   You become lightheaded or faint.  MAKE SURE YOU:   Understand these instructions.  Will watch your condition.  Will get help right away if you are not doing well or get worse.   This information is not intended to replace advice given to you by your health care provider. Make sure you discuss any questions you have with your health care provider.   Document Released: 12/13/2004 Document Revised: 11/05/2012 Document Reviewed: 08/08/2012 Elsevier Interactive Patient Education 2016 [employer] The United Ways 211 is a great source of information about community services available.  Access by  dialing 2-1-1 from anywhere in [place], or by website -  CustodianSupply.fi.   Other Local Resources (Updated 03/2015)  Social  Nutritional therapist Number and Address  Aging, Disability and [place] on wheels  Community meal sites  Transportation services  Adult day health care  Center for [place] caregiver support services [place], [place]  Guardianship  Hearing loss (assistive technology, interpreters, etc.)  Low-income services (health care, child care, housing, financial and nutrition assistance)  Medicaid  Pregnancy services  Utility assistance  Veterans services [phone] [address] [place], [place] [address]   [place] management  Family education  Information and referral [phone] [address] [place], [place] [address]  [place] Social Services  Aging and [place] services  Deaf-Blind services  Disability services  Guardianship  Hearing loss (assistive technology, interpreters, etc.)  Low-income services (health care, child care, housing, financial and nutrition assistance)  Medicaid  Pregnancy services  Utility assistance  Veterans services [phone] [address] [place], [place] [address]  [place]  Aging and [place] services  Guardianship  Hearing loss (assistive technology, interpreters, etc.)  Low-income services (health care, child care, housing, financial and nutrition assistance)  [facility]  Pregnancy services  Utility assistance  Veterans services [phone] [place], [place] [address]  [place] services  Guardianship  Hearing loss (assistive technology, interpreters,  etc.)  Low-income services (health care, child care, housing, financial and nutrition assistance)  Medicaid  Pregnancy services  Utility assistance  Veterans services [phone] [place], [place] [address]   [place]  Aging and [place]  Hearing loss (assistive technology, interpreters, etc.)  Low-income services (health care, child care, housing, financial and nutrition assistance)  Medicaid  Pregnancy services  Utility assistance  Veterans services [phone] [address], [place], [place] [address]  [place] Division of Social Services  Aging and [place]  Hearing loss (assistive technology, interpreters, etc.)  Low-income services (health care, child care, housing, financial and nutrition assistance)  Medicaid  Pregnancy services  Utility assistance  Veterans services [phone] [address] [place], [place] [address]  Senior Resources of [place]  Serves adults age 66 and over and their families  Caregiver information  Foster grandparents  Special educational needs teacher meals  Refugee programs  Retired Energy manager (RSVP)  [facility])  [place] [phone]  [address], [place] [address]  [phone]  [place], [place] [address]  [employer]  Help Line  Home Care  Living at [employer] on [place] Partner Workshops  Referrals for chore and homemaker services [phone]  [address] [place], [place] [address]  [employer]  Crisis assistance  Medication assistance  Rental assistance  Food pantry  Medication assistance  Housing assistance  Emergency food distribution  Utility assistance  Boys and [place] [address] [place], [place] (Tuesdays and Thursdays from [place] - 12 noon by appointment only) [address], [place] [address]  [phone] [address] [place], [place] [address]    Community Resource Guide Shelters The United Ways 211 is a great source of information about community services available.  Access by dialing 2-1-1 from anywhere in [place], or by website -  CustodianSupply.fi.   Other Local Resources (Updated 03/2015)  [place]   Phone Number and Address  [place] for homeless and needy men with substance abuse issues [phone] [address] [place] of [place]  Emergency assistance  [employer]  Pantry services [phone] [place], [place]  Domestic violence shelter for women and their children [place], [place]  Domestic violence shelter for women and their children [place], [place] [facility])     The [facility] coordinates access to most shelters in [place] in person Monday - Friday, 10 am - 4 pm.    After hours/ weekends, contact individual shelters directly [phone] [address] [place], [place]  [facility] - [place]  Emergency financial assistance  Permanent supportive housing [phone] [address] [place], [place]   [employer]   Crisis assistance  Medication  Housing  Food  Utility assistance [phone] [place], [place]    Crisis  assistance  Medication  Housing  Food  Utility assistance [place], [place], [place]  The [employer] of [place]     Transitional housing  Case Psychiatric nurse assistance [phone] [address], [place], [employer] for adult men and women [phone] [address] [place], [place]  24-hour Crisis Line for those Facing Homelessness    [phone]  Community [facility]/Residential  Substance Abuse Treatment Adults The United Ways 211 is a great source of information about community services available.  Access by dialing 2-1-1 from anywhere in [place], or by website -  CustodianSupply.fi.   (Updated 03/2015)  Crisis Assistance 24 hours a day   [employer]  24-hour crisis assistance: [place], [place]   [facility]  24-hour crisis [place], [place]   24-hour crisis assistance: [place], [place] Access to Care Line  24-hour crisis assistance; [phone] All   The[facility]atives  24-hour crisis response line: [phone] All   Other Local Resources (Updated 03/2015)  Inpatient Behavioral Health/Residential Substance Abuse Treatment Programs   Services      Address and Phone Number  [place] ([place])   14-day residential rehabilitation  [phone] [address] [place], [place] (Micro)    Detox - private pay only  14-day residential rehabilitation -  Medicaid, insurance, private pay only [phone], or [place], [place], [place] [address]   [place] only  Multiple facilities [phone]  admissions   [facility] ([facility])   90-day program  Must be homeless to participate  [phone], or [place], [place] only [phone], or  [place], [place] [address]  [place]     Must make an appointment  Transportation is offered from [place] on [place].  Accepts private pay, [place]vin [facility] [phone] [place], [place] [address]   [employer]  Females only  Associated with the [place] [phone] [place], [place] [address]  [facility]   Private insurance only [phone], or [phone] [address] [place], [id number]  [place]    Detox  Residential rehabilitation  Private insurance only  Multiple locations [phone] admissions  [place] of [place] pay  Private insurance [phone] [address] [place] [address]  [facility]    Males only  Fee required at time of admission [place], [place] [address]  Path of [place] pay only  [phone] [address] [place] [address] [place], [place]  RTS (Residential Treatment Services)    Detox - private pay, Medicaid  Residential rehabilitation for males  - Medicare, Medicaid, insurance, private pay [phone] [place], [place] interviews Monday - Saturday from 8 am - 4 pm  Individuals with legal charges are not eligible [phone] [address] [place], [place] [address]  The [facility]   Must be willing to work  Must attend Alcoholics Anonymous meetings [phone] [address] [place], [place]    Faith-based program  Private pay only [phone] [place], [place]

## 2015-06-13 NOTE — ED Notes (Signed)

## 2015-06-13 NOTE — ED Provider Notes (Signed)
Patient's well-appearing no acute distress. Calm and comfortable, awake alert oriented ambulatory tolerating oral intake. Clinically sober. Repeat ethanol level is 41. No evidence of withdrawal.  Patient actually clarifies now that he wants to be referred to detox because he is currently homeless and he doesn't receive his disability and retirement benefits until the end of the month or early next month. Chief complaint is homelessness, with concurrent alcohol abuse and intoxication. He is now sober and able to take care of himself. We'll discharge the patient with community resources for social services, substance abuse services, and shelters.  Carrie Mew, MD 06/13/15 1001

## 2015-06-13 NOTE — ED Notes (Signed)
ED BHU [place] Is the patient under IVC or is there intent for IVC: No. Is the patient medically cleared: Yes.   Is there vacancy in the ED BHU: yes Is the population mix appropriate for patient:  Is the patient awaiting placement in inpatient or outpatient setting: No. Has the patient had a psychiatric consult: No.  Pt to most likely discharge to home with referral to detox program  Survey of unit performed for contraband, proper placement and condition of furniture, tampering with fixtures in bathroom, shower, and each patient room: Yes.  ; Findings:  APPEARANCE/BEHAVIOR Calm and cooperative NEURO ASSESSMENT Orientation:  Oriented x3 Hallucinations: No.None noted (Hallucinations) Speech: Normal Gait: normal RESPIRATORY ASSESSMENT Even  unlabored respirations noted  CARDIOVASCULAR ASSESSMENT Regular rate  Pulses equal  Skin warm and dry   GASTROINTESTINAL ASSESSMENT no GI complaint EXTREMITIES Full ROM  PLAN OF CARE Provide calm/safe environment. Vital signs assessed twice daily. ED BHU Assessment once each 12-hour shift. Collaborate with intake RN daily or as condition indicates. Assure the ED provider has rounded once each shift. Provide and encourage hygiene. Provide redirection as needed. Assess for escalating behavior; address immediately and inform ED provider.  Assess family dynamic and appropriateness for visitation as needed: Yes.  ; If necessary, describe findings:  Educate the patient/family about BHU procedures/visitation: Yes.  ; If necessary, describe findings:

## 2015-06-13 NOTE — ED Notes (Signed)
No am meds ordered at this time  Pt sitting up in bed  - repeat ethanol to be drawn   Consult with TTS - for detox placement

## 2015-06-13 NOTE — ED Notes (Signed)
Breakfast provided  - pt sitting up in bed eating - No verbalized needs or concerns

## 2015-07-28 ENCOUNTER — Emergency Department
Admission: EM | Admit: 2015-07-28 | Discharge: 2015-07-29 | Disposition: A | Discharge status: Home / Self Care | Payer: Medicare Other | Attending: Emergency Medicine | Admitting: Emergency Medicine

## 2015-07-28 ENCOUNTER — Emergency Department: Payer: Medicare Other

## 2015-07-28 DIAGNOSIS — R4182 Altered mental status, unspecified: Secondary | ICD-10-CM | POA: Diagnosis not present

## 2015-07-28 DIAGNOSIS — F319 Bipolar disorder, unspecified: Secondary | ICD-10-CM | POA: Insufficient documentation

## 2015-07-28 DIAGNOSIS — F10920 Alcohol use, unspecified with intoxication, uncomplicated: Secondary | ICD-10-CM

## 2015-07-28 DIAGNOSIS — F10120 Alcohol abuse with intoxication, uncomplicated: Secondary | ICD-10-CM | POA: Insufficient documentation

## 2015-07-28 DIAGNOSIS — I10 Essential (primary) hypertension: Secondary | ICD-10-CM | POA: Diagnosis not present

## 2015-07-28 DIAGNOSIS — F10129 Alcohol abuse with intoxication, unspecified: Secondary | ICD-10-CM | POA: Diagnosis not present

## 2015-07-28 DIAGNOSIS — F17210 Nicotine dependence, cigarettes, uncomplicated: Secondary | ICD-10-CM | POA: Insufficient documentation

## 2015-07-28 DIAGNOSIS — E785 Hyperlipidemia, unspecified: Secondary | ICD-10-CM | POA: Diagnosis not present

## 2015-07-28 LAB — URINE DRUG SCREEN, QUALITATIVE (ARMC ONLY)
Amphetamines, Ur Screen: NOT DETECTED
BARBITURATES, UR SCREEN: NOT DETECTED
BENZODIAZEPINE, UR SCRN: NOT DETECTED
Cannabinoid 50 Ng, Ur ~~LOC~~: NOT DETECTED
Cocaine Metabolite,Ur ~~LOC~~: POSITIVE — AB
MDMA (Ecstasy)Ur Screen: NOT DETECTED
METHADONE SCREEN, URINE: NOT DETECTED
OPIATE, UR SCREEN: NOT DETECTED
Phencyclidine (PCP) Ur S: NOT DETECTED
TRICYCLIC, UR SCREEN: NOT DETECTED

## 2015-07-28 LAB — ETHANOL: ALCOHOL ETHYL (B): 305 mg/dL — AB (ref <5)

## 2015-07-28 LAB — COMPREHENSIVE METABOLIC PANEL
ALT: 24 U/L (ref 17–63)
ANION GAP: 13 (ref 5–15)
AST: 47 U/L — AB (ref 15–41)
Albumin: 4.1 g/dL (ref 3.5–5.0)
Alkaline Phosphatase: 66 U/L (ref 38–126)
BILIRUBIN TOTAL: 0.7 mg/dL (ref 0.3–1.2)
BUN: 13 mg/dL (ref 6–20)
CALCIUM: 8.5 mg/dL — AB (ref 8.9–10.3)
CO2: 23 mmol/L (ref 22–32)
Chloride: 103 mmol/L (ref 101–111)
Creatinine, Ser: 0.95 mg/dL (ref 0.61–1.24)
GLUCOSE: 76 mg/dL (ref 65–99)
Potassium: 3.7 mmol/L (ref 3.5–5.1)
SODIUM: 139 mmol/L (ref 135–145)
TOTAL PROTEIN: 6.8 g/dL (ref 6.5–8.1)

## 2015-07-28 LAB — CBC
HCT: 41.9 % (ref 40.0–52.0)
Hemoglobin: 14.2 g/dL (ref 13.0–18.0)
MCH: 34 pg (ref 26.0–34.0)
MCHC: 33.9 g/dL (ref 32.0–36.0)
MCV: 100.3 fL — AB (ref 80.0–100.0)
PLATELETS: 218 10*3/uL (ref 150–440)
RBC: 4.18 MIL/uL — ABNORMAL LOW (ref 4.40–5.90)
RDW: 14.6 % — AB (ref 11.5–14.5)
WBC: 9 10*3/uL (ref 3.8–10.6)

## 2015-07-28 NOTE — ED Provider Notes (Signed)
[facility] Emergency Department Provider Note  ____________________________________________    I have reviewed the triage vital signs and the nursing notes.   HISTORY  Chief Complaint Alcohol Intoxication    HPI Adam Good is a 70 y.o. male who presents asking for help with alcohol detoxification. He admits to being intoxicated today. He reports drinking alcohol daily. He denies fall today.     Past Medical History  Diagnosis Date  . Bipolar disorder ([place])   . Gastroesophageal reflux disease   . Essential hypertension   . Alcohol abuse   . Heart murmur     Not heard on exam  . Demand ischemia of myocardium [place] [facility]) Nov 2015    Positive troponins in the setting of a fall during intoxication; Myoview February 2016: EF 65-70%, no evidence of ischemia or infarction. No RWMA.  . Dyslipidemia     On statin: FLP 12/2013: TC 143, TG 144, HDL 45, LDL 69  . Sleep apnea     Patient Active Problem List   Diagnosis Date Noted  . Dyslipidemia   . Essential hypertension   . Alcohol abuse   . Heart murmur   . Demand ischemia of myocardium ([place]) 01/17/2014    Past Surgical History  Procedure Laterality Date  . Hernia repair    . Knee surgery    . Nm myoview ltd  Jan 2009; 05/12/14    a) No ischemia/infarct. Normal EF - 73%; b) EF 65-70%, no evidence of ischemia or infarction. No RWMA.    Current Outpatient Rx  Name  Route  Sig  Dispense  Refill  . albuterol (PROVENTIL HFA;VENTOLIN HFA) 108 (90 BASE) MCG/ACT inhaler   Inhalation   Inhale 2 puffs into the lungs every 4 (four) hours as needed for wheezing or shortness of breath.         Marland Kitchen amLODipine (NORVASC) 5 MG tablet   Oral   Take 5 mg by mouth daily.         . hydrochlorothiazide (HYDRODIURIL) 25 MG tablet   Oral   Take 25 mg by mouth daily.         Marland Kitchen lisinopril (PRINIVIL,ZESTRIL) 20 MG tablet   Oral   Take 20 mg by mouth daily.         . rosuvastatin (CRESTOR) 20 MG tablet  Oral   Take 20 mg by mouth at bedtime.           Allergies Review of patient's allergies indicates no known allergies.  Family History  Problem Relation Age of Onset  . Cancer Father     Social History Social History  Substance Use Topics  . Smoking status: Current Every Day Smoker -- 0.50 packs/day for 57 years    Types: Cigarettes  . Smokeless tobacco: Current User    Types: Snuff  . Alcohol Use: 0.0 oz/week    0 Standard drinks or equivalent per week     Comment: He has been a heavy alcohol drinker for > 30 yrs.  Stats that he quit 3 weeks ago.     Review of Systems  Constitutional: No dizziness Eyes: Negative for redness ENT: Negative for neck pain Cardiovascular: No chest pain Respiratory: Negative for shortness of breath. Gastrointestinal: Negative for nausea  Musculoskeletal: Negative for extremity injury Skin: Negative for abrasion or laceration Neurological: Negative for focal weakness Psychiatric: no anxiety    ____________________________________________   PHYSICAL EXAM:  VITAL SIGNS: ED Triage Vitals  Enc Vitals Group  BP 07/28/15 1837 136/75 mmHg     Pulse Rate 07/28/15 1837 96     Resp 07/28/15 1837 20     Temp 07/28/15 1837 98.2 F (36.8 C)     Temp Source 07/28/15 1837 Oral     SpO2 07/28/15 1837 95 %     Weight 07/28/15 1837 160 lb (72.576 kg)     Height 07/28/15 1837 5\' 8"  (1.727 m)     Head Cir --      Peak Flow --      Pain Score --      Pain Loc --      Pain Edu? --      Excl. in [place]? --     Constitutional: Alert and oriented. Slow to respond, appears intoxicated Eyes: Conjunctivae are normal. No erythema or injection ENT   Head: Normocephalic and atraumatic.   Mouth/Throat: Mucous membranes are moist. Cardiovascular: Normal rate, regular rhythm. Normal and symmetric distal pulses are present in the upper extremities.  Respiratory: Normal respiratory effort without tachypnea nor retractions. Breath sounds are clear  and equal bilaterally.  Gastrointestinal: Soft and non-tender in all quadrants. No distention. There is no CVA tenderness. Genitourinary: deferred Musculoskeletal: Nontender with normal range of motion in all extremities. No lower extremity tenderness nor edema. Neurologic:  Normal speech and language. No gross focal neurologic deficits are appreciated. Skin:  Skin is warm, dry and intact. No rash noted. Psychiatric: Mood and affect are normal. Patient exhibits appropriate insight and judgment.  ____________________________________________    LABS (pertinent positives/negatives)  Labs Reviewed  CBC  COMPREHENSIVE METABOLIC PANEL  ETHANOL  URINE DRUG SCREEN, QUALITATIVE ([facility] ONLY)    ____________________________________________   EKG  None  ____________________________________________    RADIOLOGY  CT head pending  ____________________________________________   PROCEDURES  Procedure(s) performed: none  Critical Care performed:none  ____________________________________________   INITIAL IMPRESSION / ASSESSMENT AND PLAN / ED COURSE  Pertinent labs & imaging results that were available during my care of the patient were reviewed by me and considered in my medical decision making (see chart for details).  Patient presents with apparent alcohol intoxication, he is requesting detox.We will check labs, obtain CT head given that he is baseline unsteady and uses a walker to ambulate. I will ask TTS to see him  ____________________________________________   FINAL CLINICAL IMPRESSION(S) / ED DIAGNOSES  Alcohol intoxication       Lavonia Drafts, MD 07/28/15 2031

## 2015-07-28 NOTE — ED Notes (Signed)
Pt sitting in lobby in w/c; did not answer when name called several times; smells strongly of ETOH and admits to drinking and wanting detox; does not know how he got here to the ED; pt very unsteady upon standing from w/c to stretcher. Patient shows signs of intoxication, fall risk changed from low at original triage note to high.

## 2015-07-28 NOTE — BHH Counselor (Signed)
TTS made attempt to talk to patient about detox options.  Pt is still intoxicated.  TTS will provide patient with resources once he's sober.  Patient has Medicare and cannot be referred to RTS.

## 2015-07-28 NOTE — ED Notes (Signed)
Pt brought into triage by [employer]. Pt was found intoxicated in the [employer].

## 2015-07-28 NOTE — ED Notes (Signed)
Pt too lethargic to eat night time snack tray. Staff will ask pt if he wants food tray when he is more aroused.

## 2015-07-28 NOTE — ED Notes (Signed)
PT seeking detox from alcohol, pt intoxicated on arrival. Assistance to bed. Pt mumbling; does not recall who brought him here.

## 2015-07-28 NOTE — ED Notes (Signed)
Patient transported to CT 

## 2015-07-28 NOTE — ED Notes (Addendum)
Pt up to bathroom with 1 person assist for balance.

## 2015-07-28 NOTE — ED Notes (Signed)
Pt here voluntary wanting help with alcohol. Pt with strong smell of ETOH.

## 2015-07-29 NOTE — ED Notes (Signed)
Pt PO challenged, pt able to keep crackers down. Pt up ambulating without assistance.

## 2015-07-29 NOTE — ED Provider Notes (Addendum)
Patient awake alert oriented, calm and comfortable. Steady gait, tolerating oral intake. Appears to be clinically sober. Still requesting alcohol detox. He is medically stable and suitable for discharge and outpatient follow-up. No evidence of any significant withdrawal syndrome at this time. We'll refer [facility] to [facility] for further evaluation. Staff will attempt to arrange a bus pass or other transportation for him to nearby [employer].  Carrie Mew, MD 07/29/15 [id number]  Carrie Mew, MD 07/29/15 0630

## 2015-07-29 NOTE — Discharge Instructions (Signed)
Alcohol Intoxication Alcohol intoxication occurs when the amount of alcohol that a person has consumed impairs his or her ability to mentally and physically function. Alcohol directly impairs the normal chemical activity of the brain. Drinking large amounts of alcohol can lead to changes in mental function and behavior, and it can cause many physical effects that can be harmful.  Alcohol intoxication can range in severity from mild to very severe. Various factors can affect the level of intoxication that occurs, such as the person's age, gender, weight, frequency of alcohol consumption, and the presence of other medical conditions (such as diabetes, seizures, or heart conditions). Dangerous levels of alcohol intoxication may occur when people drink large amounts of alcohol in a short period (binge drinking). Alcohol can also be especially dangerous when combined with certain prescription medicines or "recreational" drugs. SIGNS AND SYMPTOMS Some common signs and symptoms of mild alcohol intoxication include:  Loss of coordination.  Changes in mood and behavior.  Impaired judgment.  Slurred speech. As alcohol intoxication progresses to more severe levels, other signs and symptoms will appear. These may include:  Vomiting.  Confusion and impaired memory.  Slowed breathing.  Seizures.  Loss of consciousness. DIAGNOSIS  Your health care provider will take a medical history and perform a physical exam. You will be asked about the amount and type of alcohol you have consumed. Blood tests will be done to measure the concentration of alcohol in your blood. In many places, your blood alcohol level must be lower than 80 mg/dL (0.08%) to legally drive. However, many dangerous effects of alcohol can occur at much lower levels.  TREATMENT  People with alcohol intoxication often do not require treatment. Most of the effects of alcohol intoxication are temporary, and they go away as the alcohol naturally  leaves the body. Your health care provider will monitor your condition until you are stable enough to go home. Fluids are sometimes given through an IV access tube to help prevent dehydration.  HOME CARE INSTRUCTIONS  Do not drive after drinking alcohol.  Stay hydrated. Drink enough water and fluids to keep your urine clear or pale yellow. Avoid caffeine.   Only take over-the-counter or prescription medicines as directed by your health care provider.  SEEK MEDICAL CARE IF:   You have persistent vomiting.   You do not feel better after a few days.  You have frequent alcohol intoxication. Your health care provider can help determine if you should see a substance use treatment counselor. SEEK IMMEDIATE MEDICAL CARE IF:   You become shaky or tremble when you try to stop drinking.   You shake uncontrollably (seizure).   You throw up (vomit) blood. This may be bright red or may look like black coffee grounds.   You have blood in your stool. This may be bright red or may appear as a black, tarry, bad smelling stool.   You become lightheaded or faint.  MAKE SURE YOU:   Understand these instructions.  Will watch your condition.  Will get help right away if you are not doing well or get worse.   This information is not intended to replace advice given to you by your health care provider. Make sure you discuss any questions you have with your health care provider.   Document Released: 12/13/2004 Document Revised: 11/05/2012 Document Reviewed: 08/08/2012 Elsevier Interactive Patient Education 2016 [employer].  Alcohol Use Disorder Alcohol use disorder is a mental disorder. It is not a one-time incident of heavy drinking. Alcohol use  disorder is the excessive and uncontrollable use of alcohol over time that leads to problems with functioning in one or more areas of daily living. People with this disorder risk harming themselves and others when they drink to excess. Alcohol use  disorder also can cause other mental disorders, such as mood and anxiety disorders, and serious physical problems. People with alcohol use disorder often misuse other drugs.  Alcohol use disorder is common and widespread. Some people with this disorder drink alcohol to cope with or escape from negative life events. Others drink to relieve chronic pain or symptoms of mental illness. People with a family history of alcohol use disorder are at higher risk of losing control and using alcohol to excess.  Drinking too much alcohol can cause injury, accidents, and health problems. One drink can be too much when you are:  Working.  Pregnant or breastfeeding.  Taking medicines. Ask your doctor.  Driving or planning to drive. SYMPTOMS  Signs and symptoms of alcohol use disorder may include the following:   Consumption ofalcohol inlarger amounts or over a longer period of time than intended.  Multiple unsuccessful attempts to cutdown or control alcohol use.   A great deal of time spent obtaining alcohol, using alcohol, or recovering from the effects of alcohol (hangover).  A strong desire or urge to use alcohol (cravings).   Continued use of alcohol despite problems at work, school, or home because of alcohol use.   Continued use of alcohol despite problems in relationships because of alcohol use.  Continued use of alcohol in situations when it is physically hazardous, such as driving a car.  Continued use of alcohol despite awareness of a physical or psychological problem that is likely related to alcohol use. Physical problems related to alcohol use can involve the brain, heart, liver, stomach, and intestines. Psychological problems related to alcohol use include intoxication, depression, anxiety, psychosis, delirium, and dementia.   The need for increased amounts of alcohol to achieve the same desired effect, or a decreased effect from the consumption of the same amount of alcohol  (tolerance).  Withdrawal symptoms upon reducing or stopping alcohol use, or alcohol use to reduce or avoid withdrawal symptoms. Withdrawal symptoms include:  Racing heart.  Hand tremor.  Difficulty sleeping.  Nausea.  Vomiting.  Hallucinations.  Restlessness.  Seizures. DIAGNOSIS Alcohol use disorder is diagnosed through an assessment by your health care provider. Your health care provider may start by asking three or four questions to screen for excessive or problematic alcohol use. To confirm a diagnosis of alcohol use disorder, at least two symptoms must be present within a 59-month period. The severity of alcohol use disorder depends on the number of symptoms:  Mild--two or three.  Moderate--four or five.  Severe--six or more. Your health care provider may perform a physical exam or use results from lab tests to see if you have physical problems resulting from alcohol use. Your health care provider may refer you to a mental health professional for evaluation. TREATMENT  Some people with alcohol use disorder are able to reduce their alcohol use to low-risk levels. Some people with alcohol use disorder need to quit drinking alcohol. When necessary, mental health professionals with specialized training in substance use treatment can help. Your health care provider can help you decide how severe your alcohol use disorder is and what type of treatment you need. The following forms of treatment are available:   Detoxification. Detoxification involves the use of prescription medicines to prevent alcohol  withdrawal symptoms in the first week after quitting. This is important for people with a history of symptoms of withdrawal and for heavy drinkers who are likely to have withdrawal symptoms. Alcohol withdrawal can be dangerous and, in severe cases, cause death. Detoxification is usually provided in a hospital or in-patient substance use treatment facility.  Counseling or talk therapy.  Talk therapy is provided by substance use treatment counselors. It addresses the reasons people use alcohol and ways to keep them from drinking again. The goals of talk therapy are to help people with alcohol use disorder find healthy activities and ways to cope with life stress, to identify and avoid triggers for alcohol use, and to handle cravings, which can cause relapse.  Medicines.Different medicines can help treat alcohol use disorder through the following actions:  Decrease alcohol cravings.  Decrease the positive reward response felt from alcohol use.  Produce an uncomfortable physical reaction when alcohol is used (aversion therapy).  Support groups. Support groups are run by people who have quit drinking. They provide emotional support, advice, and guidance. These forms of treatment are often combined. Some people with alcohol use disorder benefit from intensive combination treatment provided by specialized substance use treatment centers. Both inpatient and outpatient treatment programs are available.   This information is not intended to replace advice given to you by your health care provider. Make sure you discuss any questions you have with your health care provider.   Document Released: 04/12/2004 Document Revised: 03/26/2014 Document Reviewed: 06/12/2012 Elsevier Interactive Patient Education 2016 [place] The United Ways 211 is a great source of information about community services available.  Access by dialing 2-1-1 from anywhere in [place], or by website -  CustodianSupply.fi.   Other Local Resources (Updated 03/2015)  [place]   Phone Number and Address  [facility] for homeless and needy men with substance abuse issues [phone] [address] [place] of [place]  Emergency assistance  [employer]  Pantry services [phone] [place],  [place]  Domestic violence shelter for women and their children [place], [place]  Domestic violence shelter for women and their children [place], [place] [facility])     The [facility] coordinates access to most shelters in [place] in person Monday - Friday, 10 am - 4 pm.    After hours/ weekends, contact individual shelters directly [phone] [address], [place]  [facility] [place]  Emergency financial assistance  Permanent supportive housing [phone] [address] [place], [place]   [employer]   Crisis assistance  Medication  Housing  Food  Utility assistance 714 021 1621 [place], [place]    Crisis assistance  Medication  Housing  Food  Utility assistance [place], [place], [place]  [employer] of [place]     Transitional housing  Case Psychiatric nurse assistance [phone] [address] [place], [place], [employer] for adult men and women [phone] [address] [place], [place]  24-hour Crisis Line for those Facing Homelessness    [phone]  [facility] Guide Outpatient Counseling/Substance Abuse Adult The United Ways 211 is a great source of information about  community services available.  Access by dialing 2-1-1 from anywhere in [place], or by website -  CustodianSupply.fi.   Other Local Resources (Updated 03/2015)  [employer]  Crisis Hotline, available 24 hours a day, 7 days a week: [place], [place]   [facility]  Crisis Hotline, available 24 hours a day, 7 days a week: [place], [place]  [facility]  Suicide Prevention Hotline, available 24 hours a day, 7 days a week: [place], [place], available 24 hours a day, 7 days a week: [place], [place] Access to [employer], available 24 hours a day, 7 days a week: [phone] All   [facility]  Crisis Hotline, available 24 hours a day, 7 days a week: [phone] All   Other Local Resources (Updated 03/2015)  Outpatient Counseling/ Substance Abuse Programs  Services     Address and Phone Number  ADS (Alcohol and Drug Services)   Options include Individual counseling, group counseling, intensive outpatient program (several hours a day, several days a week)  Offers depression assessments  Provides methadone maintenance program [phone] [address], [place], [place] partial hospitalization/day treatment and DUI/DWI programs  [employer], private insurance [phone] [address], [phone] [place], [place] [address]  [place] include intensive outpatient program (several hours a day, several days a week), outpatient treatment, DUI/DWI services, family education  Also has some services specifically for [employer] transitional housing  [phone] [address] [place], [place] [address]     [place] Medicare, private pay, and private insurance [phone] [address], [place], [place] [address]  [facility]  Services include individual counseling, substance abuse intensive outpatient program (several hours a day, several days a week), day treatment  Blinda Leatherwood, Medicaid, private insurance [phone] [address], [place], [place] [address]  [facility]    Offers substance abuse intensive outpatient program (several hours a day, several days a week), partial hospitalization program [phone] [address] [place], [place] [address]  [phone] [address] [place], [place] [address]  [phone] [place], [place] [address]  [phone], [place], [place] [address]  [facility]  Individual counseling only  Accepts private insurance only [phone] [address], [place], [place] [address]  [facility]: Methadone Clinic  Methadone maintenance program [phone] [place], [place] [address]  [facility] providing substance abuse and mental health counseling  Accepts Medicaid, Medicare, private insurance  Offers sliding scale for uninsured [phone] [place], [place] in [place] individual counseling, and intensive in-home services [phone] [address], [place], [place] [address]  [facility] [place] individual counseling, family counseling, group therapy, domestic violence counseling, consumer credit counseling  Accepts Medicare, Medicaid, private insurance  Offers sliding scale for uninsured [phone] [address] [place],  [address]  [phone] [facility], [address] [place], [place]  [facility]  Offers individual, family and group counseling  3 locations - [place], [place], and [place]  [place] [address]  [place], [place] [address]  [address] [place], [place] [address]  [place], [place] [address]  [facility]    Offers psychiatric assessment, 8-week Intensive Outpatient Program (several hours a day, several times a week, daytime or evenings), early recovery group, family Program, medication management  Private pay or private insurance only [phone], or  [phone] [address] [place], [place] [address]  [facility]  Offers individual, couples and family counseling  Accepts Medicaid, private insurance, and sliding scale for uninsured [phone] [address] [place], [place] [address]  Launa Flight, MD  Individual counseling  Private insurance [phone] [place], [place] [address]  [facility]   Offers assessment, substance abuse treatment, and behavioral health treatment [phone] [address] [place], [place] [address]  [place]  Individual counseling  Accepts private insurance [phone] [place], [place] [address]  [facility]  Individual counseling  Blinda Leatherwood, private insurance [phone] [place], [place] [address]  [place]    Offers intensive outpatient program (several hours a day, several times a week)  Private pay, private insurance [phone] [place], [place]  Individual counseling  Medicare, private insurance [phone] [address] [place], [place] [address]  [place]    Offers intensive outpatient program (several hours a day, several times a week) and partial hospitalization program [phone] [place], [place] [address]  Letta Moynahan, MD  Individual counseling [phone] [address] [place], [place] [address]  [facility] counseling to individuals, couples, and families  Accepts Medicare and private insurance; offers sliding scale for uninsured [phone] [place], [place] [address]  [facility]  Christian counseling [phone] [address], [place], [place] [address]  [facility] [employer] crisis counseling, individual counseling, group therapy, in-home therapy, domestic violence services, day treatment, DWI services, Conservation officer, nature (CST), Assertive Community Treatment Team (ACTT), substance abuse Intensive Outpatient Program (several hours a day, several times a week)  2 locations - [place] and [place], [place] [address]  [phone] [address] [place], [place] [address]  [place] counseling and group therapy  [place] insurance, [place], [place] [phone] [address] [place], [place]  [facility]  Offers individual and family counseling  Offers LGBTQ services  Accepts private insurance and private pay [phone] [place], [place] [address]  [facility]    Offers individual counseling, group therapy, and outpatient detox  Accepts private insurance [phone] [place], [place] Medicare, private insurance [phone] [address] [place], [place] [address]  [employer]  Individual counseling  Accepts Medicare, private insurance [phone] [address] [place], [place] [address]  [facility] [place] substance abuse Intensive Outpatient Program (several hours a day, several times a week) [phone], or [phone] [place], [place]

## 2015-11-18 ENCOUNTER — Encounter: Payer: Self-pay | Admitting: Emergency Medicine

## 2015-11-18 ENCOUNTER — Emergency Department
Admission: EM | Admit: 2015-11-18 | Discharge: 2015-11-18 | Disposition: A | Discharge status: Home / Self Care | Payer: Medicare Other | Attending: Student in an Organized Health Care Education/Training Program | Admitting: Student in an Organized Health Care Education/Training Program

## 2015-11-18 DIAGNOSIS — F17210 Nicotine dependence, cigarettes, uncomplicated: Secondary | ICD-10-CM | POA: Insufficient documentation

## 2015-11-18 DIAGNOSIS — Z79899 Other long term (current) drug therapy: Secondary | ICD-10-CM | POA: Diagnosis not present

## 2015-11-18 DIAGNOSIS — F10120 Alcohol abuse with intoxication, uncomplicated: Secondary | ICD-10-CM | POA: Insufficient documentation

## 2015-11-18 DIAGNOSIS — I10 Essential (primary) hypertension: Secondary | ICD-10-CM | POA: Insufficient documentation

## 2015-11-18 DIAGNOSIS — F10129 Alcohol abuse with intoxication, unspecified: Secondary | ICD-10-CM | POA: Diagnosis not present

## 2015-11-18 DIAGNOSIS — Y909 Presence of alcohol in blood, level not specified: Secondary | ICD-10-CM | POA: Diagnosis not present

## 2015-11-18 DIAGNOSIS — F10920 Alcohol use, unspecified with intoxication, uncomplicated: Secondary | ICD-10-CM

## 2015-11-18 DIAGNOSIS — F17290 Nicotine dependence, other tobacco product, uncomplicated: Secondary | ICD-10-CM | POA: Insufficient documentation

## 2015-11-18 NOTE — ED Provider Notes (Signed)
[facility] Emergency Department Provider Note  L5 caveat: Review of systems and history is limited by intoxication      Time seen: ----------------------------------------- 12:43 PM on 11/18/2015 -----------------------------------------    I have reviewed the triage vital signs and the nursing notes.   HISTORY  Chief Complaint No chief complaint on file.    HPI Adam Good is a 70 y.o. male who presents to ER being brought by EMS after being found on the sidewalk outside the [employer]. Patient admits to drinking alcohol today, ascribes to wanting to go to detox to get help with alcohol.   Past Medical History:  Diagnosis Date  . Alcohol abuse   . Bipolar disorder ([place])   . Demand ischemia of myocardium [facility]) Nov 2015   Positive troponins in the setting of a fall during intoxication; Myoview February 2016: EF 65-70%, no evidence of ischemia or infarction. No RWMA.  . Dyslipidemia    On statin: FLP 12/2013: TC 143, TG 144, HDL 45, LDL 69  . Essential hypertension   . Gastroesophageal reflux disease   . Heart murmur    Not heard on exam  . Sleep apnea     Patient Active Problem List   Diagnosis Date Noted  . Dyslipidemia   . Essential hypertension   . Alcohol abuse   . Heart murmur   . Demand ischemia of myocardium ([facility]) 01/17/2014    Past Surgical History:  Procedure Laterality Date  . HERNIA REPAIR    . KNEE SURGERY    . NM MYOVIEW LTD  Jan 2009; 05/12/14   a) No ischemia/infarct. Normal EF - 73%; b) EF 65-70%, no evidence of ischemia or infarction. No RWMA.    Allergies Review of patient's allergies indicates no known allergies.  Social History Social History  Substance Use Topics  . Smoking status: Current Every Day Smoker    Packs/day: 0.50    Years: 57.00    Types: Cigarettes  . Smokeless tobacco: Current User    Types: Snuff  . Alcohol use 0.0 oz/week     Comment: He has been a heavy alcohol drinker for > 30  yrs.  Stats that he quit 3 weeks ago.     Review of Systems Unknown at this time, positive for recent alcohol intake  ____________________________________________   PHYSICAL EXAM:  VITAL SIGNS: ED Triage Vitals  Enc Vitals Group     BP      Pulse      Resp      Temp      Temp src      SpO2      Weight      Height      Head Circumference      Peak Flow      Pain Score      Pain Loc      Pain Edu?      Excl. in [place]?     Constitutional: Alert, No acute distress. Smells heavily of alcohol Eyes: Conjunctivae are normal. Normal extraocular movements. Cardiovascular: Normal rate, regular rhythm. No murmurs, rubs, or gallops. Respiratory: Normal respiratory effort without tachypnea nor retractions. Breath sounds are clear and equal bilaterally. No wheezes/rales/rhonchi. Gastrointestinal: Soft and nontender. Normal bowel sounds Musculoskeletal: Nontender with normal range of motion in all extremities. No lower extremity tenderness nor edema. Neurologic:  Normal speech and language. No gross focal neurologic deficits are appreciated.  Skin:  Skin is warm, dry and intact. No rash noted. Psychiatric: Mood  and affect are normal. Speech and behavior are normal.  ____________________________________________  ED COURSE:  Pertinent labs & imaging results that were available during my care of the patient were reviewed by me and considered in my medical decision making (see chart for details). Clinical Course  Patient presents for alcohol intoxication. We will attempt to see if he can ambulate safely and likely refer him for outpatient detox.  Procedures ____________________________________________  FINAL ASSESSMENT AND PLAN  Alcohol intoxication  Plan: Patient with a long history of alcoholism. He is stable for outpatient detox referral.   Earleen Newport, MD   Note: This dictation was prepared with Dragon dictation. Any transcriptional errors that result from this  process are unintentional    Earleen Newport, MD 11/18/15 1245

## 2015-11-18 NOTE — ED Notes (Signed)
Pt discharged to home  esignature not working at discharge

## 2015-11-18 NOTE — ED Notes (Signed)
BEHAVIORAL HEALTH ROUNDING Patient sleeping: No. Patient alert and oriented: yes Behavior appropriate: Yes.  ; If no, describe:  Nutrition and fluids offered: yes Toileting and hygiene offered: Yes  Sitter present: q15 minute observations and security  monitoring Law enforcement present: Yes  ODS  

## 2015-11-18 NOTE — ED Triage Notes (Signed)
Per ems, pt found asleep outside near [employer] and seemed intoxicated. Pt verbalized that he needed detox.

## 2016-02-07 ENCOUNTER — Emergency Department
Admission: EM | Admit: 2016-02-07 | Discharge: 2016-02-08 | Disposition: A | Discharge status: Home / Self Care | Payer: Medicare Other | Attending: Emergency Medicine | Admitting: Emergency Medicine

## 2016-02-07 ENCOUNTER — Encounter: Payer: Self-pay | Admitting: Emergency Medicine

## 2016-02-07 DIAGNOSIS — F10920 Alcohol use, unspecified with intoxication, uncomplicated: Secondary | ICD-10-CM

## 2016-02-07 DIAGNOSIS — I10 Essential (primary) hypertension: Secondary | ICD-10-CM | POA: Diagnosis not present

## 2016-02-07 DIAGNOSIS — F10120 Alcohol abuse with intoxication, uncomplicated: Secondary | ICD-10-CM | POA: Insufficient documentation

## 2016-02-07 DIAGNOSIS — Z590 Homelessness: Secondary | ICD-10-CM | POA: Diagnosis not present

## 2016-02-07 DIAGNOSIS — Z79899 Other long term (current) drug therapy: Secondary | ICD-10-CM | POA: Diagnosis not present

## 2016-02-07 DIAGNOSIS — F17210 Nicotine dependence, cigarettes, uncomplicated: Secondary | ICD-10-CM | POA: Insufficient documentation

## 2016-02-07 DIAGNOSIS — Y908 Blood alcohol level of 240 mg/100 ml or more: Secondary | ICD-10-CM | POA: Diagnosis not present

## 2016-02-07 DIAGNOSIS — F17290 Nicotine dependence, other tobacco product, uncomplicated: Secondary | ICD-10-CM | POA: Diagnosis not present

## 2016-02-07 LAB — CBC
HEMATOCRIT: 41.6 % (ref 40.0–52.0)
Hemoglobin: 14.3 g/dL (ref 13.0–18.0)
MCH: 35.6 pg — ABNORMAL HIGH (ref 26.0–34.0)
MCHC: 34.5 g/dL (ref 32.0–36.0)
MCV: 103.2 fL — AB (ref 80.0–100.0)
Platelets: 149 10*3/uL — ABNORMAL LOW (ref 150–440)
RBC: 4.03 MIL/uL — ABNORMAL LOW (ref 4.40–5.90)
RDW: 14.7 % — AB (ref 11.5–14.5)
WBC: 6.8 10*3/uL (ref 3.8–10.6)

## 2016-02-07 LAB — COMPREHENSIVE METABOLIC PANEL
ALT: 28 U/L (ref 17–63)
AST: 42 U/L — AB (ref 15–41)
Albumin: 4.3 g/dL (ref 3.5–5.0)
Alkaline Phosphatase: 59 U/L (ref 38–126)
Anion gap: 11 (ref 5–15)
BUN: 13 mg/dL (ref 6–20)
CHLORIDE: 97 mmol/L — AB (ref 101–111)
CO2: 25 mmol/L (ref 22–32)
CREATININE: 1.01 mg/dL (ref 0.61–1.24)
Calcium: 8.8 mg/dL — ABNORMAL LOW (ref 8.9–10.3)
GFR calc Af Amer: >60 mL/min (ref >60)
GFR calc non Af Amer: >60 mL/min (ref >60)
Glucose, Bld: 93 mg/dL (ref 65–99)
Potassium: 3.7 mmol/L (ref 3.5–5.1)
SODIUM: 133 mmol/L — AB (ref 135–145)
Total Bilirubin: 0.6 mg/dL (ref 0.3–1.2)
Total Protein: 6.9 g/dL (ref 6.5–8.1)

## 2016-02-07 LAB — ETHANOL: Alcohol, Ethyl (B): 351 mg/dL (ref <5)

## 2016-02-07 NOTE — ED Triage Notes (Signed)
Patient presents to the ED with [facility].  Police were called because patient was reportedly publicly urinating.  Patient is obviously intoxicated and reports he has been drinking all day and is an alcoholic.  Patient is difficult to understand.  Patient reports being homeless x 6-7 months.  Patient states, "I want to give up drinking but I can't."  Patient is calm and cooperative at this time.

## 2016-02-07 NOTE — ED Notes (Signed)
Critical ETOH level 351, Dr.McShane notified

## 2016-02-07 NOTE — ED Provider Notes (Signed)
[facility] Emergency Department Provider Note  ____________________________________________   First MD Initiated Contact with Patient 02/07/16 1756     (approximate)  I have reviewed the triage vital signs and the nursing notes.   HISTORY  Chief Complaint Alcohol Intoxication   HPI Adam Good is a 70 y.o. male With history on call as well as presenting to the emergency department intoxicated. The patient is also requesting references for detox. He was found trouble urinating by police and brought into the emergency department for further evaluation. He denies any complaints at this time. Says that he is homeless andis requesting something to eat. Denying any pain or any other complaints.   Past Medical History:  Diagnosis Date  . Alcohol abuse   . Bipolar disorder ([place])   . Demand ischemia of myocardium [facility]) Nov 2015   Positive troponins in the setting of a fall during intoxication; Myoview February 2016: EF 65-70%, no evidence of ischemia or infarction. No RWMA.  . Dyslipidemia    On statin: FLP 12/2013: TC 143, TG 144, HDL 45, LDL 69  . Essential hypertension   . Gastroesophageal reflux disease   . Heart murmur    Not heard on exam  . Sleep apnea     Patient Active Problem List   Diagnosis Date Noted  . Dyslipidemia   . Essential hypertension   . Alcohol abuse   . Heart murmur   . Demand ischemia of myocardium ([place]) 01/17/2014    Past Surgical History:  Procedure Laterality Date  . HERNIA REPAIR    . KNEE SURGERY    . NM MYOVIEW LTD  Jan 2009; 05/12/14   a) No ischemia/infarct. Normal EF - 73%; b) EF 65-70%, no evidence of ischemia or infarction. No RWMA.    Prior to Admission medications   Medication Sig Start Date End Date Taking? Authorizing Provider  albuterol (PROVENTIL HFA;VENTOLIN HFA) 108 (90 BASE) MCG/ACT inhaler Inhale 2 puffs into the lungs every 4 (four) hours as needed for wheezing or shortness of breath.     Historical Provider, MD  amLODipine (NORVASC) 5 MG tablet Take 5 mg by mouth daily.    Historical Provider, MD  hydrochlorothiazide (HYDRODIURIL) 25 MG tablet Take 25 mg by mouth daily.    Historical Provider, MD  lisinopril (PRINIVIL,ZESTRIL) 20 MG tablet Take 20 mg by mouth daily.    Historical Provider, MD  rosuvastatin (CRESTOR) 20 MG tablet Take 20 mg by mouth at bedtime.    Historical Provider, MD    Allergies Patient has no known allergies.  Family History  Problem Relation Age of Onset  . Cancer Father     Social History Social History  Substance Use Topics  . Smoking status: Current Every Day Smoker    Packs/day: 0.50    Years: 57.00    Types: Cigarettes  . Smokeless tobacco: Current User    Types: Snuff  . Alcohol use 0.0 oz/week     Comment: He has been a heavy alcohol drinker for > 30 yrs.  Stats that he quit 3 weeks ago.     Review of Systems Constitutional: No fever/chills Eyes: No visual changes. ENT: No sore throat. Cardiovascular: Denies chest pain. Respiratory: Denies shortness of breath. Gastrointestinal: No abdominal pain.  No nausea, no vomiting.  No diarrhea.  No constipation. Genitourinary: Negative for dysuria. Musculoskeletal: Negative for back pain. Skin: Negative for rash. Neurological: Negative for headaches, focal weakness or numbness.  10-point ROS otherwise negative.  ____________________________________________  PHYSICAL EXAM:  VITAL SIGNS: ED Triage Vitals [02/07/16 1537]  Enc Vitals Group     BP (!) 140/92     Pulse Rate 72     Resp 18     Temp 97.9 F (36.6 C)     Temp Source Oral     SpO2 98 %     Weight 160 lb (72.6 kg)     Height 5\' 8"  (1.727 m)     Head Circumference      Peak Flow      Pain Score      Pain Loc      Pain Edu?      Excl. in [place]?     Constitutional: Alert and oriented. Mildly slurred speech consistent with intoxication. Eyes: Conjunctivae are normal. PERRL. EOMI. Head: Atraumatic. Nose: No  congestion/rhinnorhea. Mouth/Throat: Mucous membranes are moist.  Oropharynx non-erythematous. Neck: No stridor.   Cardiovascular: Normal rate, regular rhythm. Grossly normal heart sounds.  Good peripheral circulation. Respiratory: Normal respiratory effort.  No retractions. Lungs CTAB. Gastrointestinal: Soft and nontender. No distention. No abdominal bruits. No CVA tenderness. Musculoskeletal: No lower extremity tenderness nor edema.  No joint effusions. Neurologic:  Normal language. No gross focal neurologic deficits are appreciated. No gait instability. Skin:  Skin is warm, dry and intact. No rash noted. Psychiatric: Mood and affect are normal. Speech and behavior are normal.  ____________________________________________   LABS (all labs ordered are listed, but only abnormal results are displayed)  Labs Reviewed  COMPREHENSIVE METABOLIC PANEL - Abnormal; Notable for the following:       Result Value   Sodium 133 (*)    Chloride 97 (*)    Calcium 8.8 (*)    AST 42 (*)    All other components within normal limits  ETHANOL - Abnormal; Notable for the following:    Alcohol, Ethyl (B) 351 (*)    All other components within normal limits  CBC - Abnormal; Notable for the following:    RBC 4.03 (*)    MCV 103.2 (*)    MCH 35.6 (*)    RDW 14.7 (*)    Platelets 149 (*)    All other components within normal limits  URINE DRUG SCREEN, QUALITATIVE ([facility] ONLY)   ____________________________________________  EKG   ____________________________________________  RADIOLOGY   ____________________________________________   PROCEDURES  Procedure(s) performed:   Procedures  Critical Care performed:   ____________________________________________   INITIAL IMPRESSION / ASSESSMENT AND PLAN / ED COURSE  Pertinent labs & imaging results that were available during my care of the patient were reviewed by me and considered in my medical decision making (see chart for  details).   Clinical Course   ----------------------------------------- 11:23 PM on 02/07/2016 -----------------------------------------  Attempted to walk patient but still with stumbling gait. Still clinically intoxicated. Signed out to Dr. Dahlia Client. We'll continue to observe for sobriety.   ____________________________________________   FINAL CLINICAL IMPRESSION(S) / ED DIAGNOSES  Alcohol intoxication.    NEW MEDICATIONS STARTED DURING THIS VISIT:  New Prescriptions   No medications on file     Note:  This document was prepared using Dragon voice recognition software and may include unintentional dictation errors.    Orbie Pyo, MD 02/07/16 [phone]

## 2016-02-07 NOTE — ED Notes (Signed)
Pt poor historian at this time, will do CIWA after pt eats tray

## 2016-02-07 NOTE — ED Notes (Addendum)
MD at bedside, attempting to ambulate patient. Patient unable to ambulate with steady gait. Pt assisted back into bed.

## 2016-02-07 NOTE — ED Notes (Signed)
Pt hard to understand due to ETOH level

## 2016-02-07 NOTE — ED Notes (Signed)
sandwhich tray given at this time

## 2016-02-08 NOTE — ED Notes (Signed)
Assisted patient up to bathroom, pt ambulated with semi-steady gait, this RN at patient side.

## 2016-02-08 NOTE — Discharge Instructions (Signed)
Please return immediately if condition worsens. Please contact her primary physician or the physician you were given for referral. If you have any specialist physicians involved in her treatment and plan please also contact them. Thank you for using [facility] emergency [facility]. ° °

## 2016-02-08 NOTE — ED Provider Notes (Signed)
-----------------------------------------   7:20 AM on 02/08/2016 -----------------------------------------   Blood pressure 138/77, pulse 79, temperature 97.9 F (36.6 C), temperature source Oral, resp. rate 18, height 5\' 8"  (1.727 m), weight 160 lb (72.6 kg), SpO2 95 %.  Assuming care from Dr. Clearnce Hasten.  In short, Adam Good is a 70 y.o. male with a chief complaint of Alcohol Intoxication .  Refer to the original H&P for additional details.  The current plan of care is to *observe the patient overnight and is now awake alert ambulatory denies any suicidal thoughts or homicidal thoughts. Patient was given alcohol abuse instructions and will be discharged in stable condition.    [name], MD 02/08/16 [phone]

## 2016-02-08 NOTE — ED Notes (Signed)
Patient ambulatory to the bathroom. Gait rapid, steady. Walked beside of patient to assist if needed, did not require assistance.

## 2017-06-04 DIAGNOSIS — I350 Nonrheumatic aortic (valve) stenosis: Secondary | ICD-10-CM

## 2017-06-04 HISTORY — DX: Nonrheumatic aortic (valve) stenosis: I35.0

## 2017-08-07 ENCOUNTER — Ambulatory Visit: Payer: Medicare Other | Admitting: Urology

## 2017-08-07 ENCOUNTER — Encounter: Payer: Self-pay | Admitting: Urology

## 2017-10-29 ENCOUNTER — Emergency Department: Payer: Medicare Other

## 2017-10-29 ENCOUNTER — Observation Stay
Admission: EM | Admit: 2017-10-29 | Discharge: 2017-10-30 | Disposition: A | Discharge status: Home / Self Care | Payer: Medicare Other | Attending: Internal Medicine | Admitting: Internal Medicine

## 2017-10-29 ENCOUNTER — Encounter: Payer: Self-pay | Admitting: Specialist

## 2017-10-29 ENCOUNTER — Other Ambulatory Visit: Payer: Self-pay

## 2017-10-29 DIAGNOSIS — T675XXA Heat exhaustion, unspecified, initial encounter: Secondary | ICD-10-CM | POA: Insufficient documentation

## 2017-10-29 DIAGNOSIS — I10 Essential (primary) hypertension: Secondary | ICD-10-CM | POA: Diagnosis not present

## 2017-10-29 DIAGNOSIS — I350 Nonrheumatic aortic (valve) stenosis: Secondary | ICD-10-CM | POA: Insufficient documentation

## 2017-10-29 DIAGNOSIS — X58XXXA Exposure to other specified factors, initial encounter: Secondary | ICD-10-CM | POA: Insufficient documentation

## 2017-10-29 DIAGNOSIS — E860 Dehydration: Secondary | ICD-10-CM | POA: Insufficient documentation

## 2017-10-29 DIAGNOSIS — R4182 Altered mental status, unspecified: Secondary | ICD-10-CM | POA: Diagnosis present

## 2017-10-29 DIAGNOSIS — R7989 Other specified abnormal findings of blood chemistry: Secondary | ICD-10-CM | POA: Diagnosis present

## 2017-10-29 DIAGNOSIS — Z590 Homelessness: Secondary | ICD-10-CM | POA: Insufficient documentation

## 2017-10-29 DIAGNOSIS — E785 Hyperlipidemia, unspecified: Secondary | ICD-10-CM | POA: Diagnosis not present

## 2017-10-29 DIAGNOSIS — F17210 Nicotine dependence, cigarettes, uncomplicated: Secondary | ICD-10-CM | POA: Diagnosis not present

## 2017-10-29 DIAGNOSIS — Y9301 Activity, walking, marching and hiking: Secondary | ICD-10-CM | POA: Diagnosis not present

## 2017-10-29 DIAGNOSIS — Z8673 Personal history of transient ischemic attack (TIA), and cerebral infarction without residual deficits: Secondary | ICD-10-CM | POA: Diagnosis not present

## 2017-10-29 DIAGNOSIS — Z79899 Other long term (current) drug therapy: Secondary | ICD-10-CM | POA: Diagnosis not present

## 2017-10-29 DIAGNOSIS — I712 Thoracic aortic aneurysm, without rupture: Secondary | ICD-10-CM | POA: Insufficient documentation

## 2017-10-29 DIAGNOSIS — Z8249 Family history of ischemic heart disease and other diseases of the circulatory system: Secondary | ICD-10-CM | POA: Diagnosis not present

## 2017-10-29 DIAGNOSIS — R778 Other specified abnormalities of plasma proteins: Secondary | ICD-10-CM

## 2017-10-29 DIAGNOSIS — Z7982 Long term (current) use of aspirin: Secondary | ICD-10-CM | POA: Diagnosis not present

## 2017-10-29 DIAGNOSIS — G9340 Encephalopathy, unspecified: Principal | ICD-10-CM | POA: Insufficient documentation

## 2017-10-29 DIAGNOSIS — R509 Fever, unspecified: Secondary | ICD-10-CM

## 2017-10-29 DIAGNOSIS — F1010 Alcohol abuse, uncomplicated: Secondary | ICD-10-CM | POA: Diagnosis not present

## 2017-10-29 DIAGNOSIS — I248 Other forms of acute ischemic heart disease: Secondary | ICD-10-CM | POA: Insufficient documentation

## 2017-10-29 HISTORY — DX: Hyperlipidemia, unspecified: E78.5

## 2017-10-29 HISTORY — DX: Other ill-defined heart diseases: I51.89

## 2017-10-29 HISTORY — DX: Cerebral infarction, unspecified: I63.9

## 2017-10-29 HISTORY — DX: Nonrheumatic aortic (valve) stenosis: I35.0

## 2017-10-29 LAB — COMPREHENSIVE METABOLIC PANEL
ALK PHOS: 69 U/L (ref 38–126)
ALT: 23 U/L (ref 0–44)
AST: 32 U/L (ref 15–41)
Albumin: 4.1 g/dL (ref 3.5–5.0)
Anion gap: 11 (ref 5–15)
BUN: 15 mg/dL (ref 8–23)
CALCIUM: 8.9 mg/dL (ref 8.9–10.3)
CHLORIDE: 106 mmol/L (ref 98–111)
CO2: 23 mmol/L (ref 22–32)
CREATININE: 1.09 mg/dL (ref 0.61–1.24)
GFR calc Af Amer: >60 mL/min (ref >60)
Glucose, Bld: 101 mg/dL — ABNORMAL HIGH (ref 70–99)
Potassium: 3.6 mmol/L (ref 3.5–5.1)
Sodium: 140 mmol/L (ref 135–145)
Total Bilirubin: 0.8 mg/dL (ref 0.3–1.2)
Total Protein: 6.5 g/dL (ref 6.5–8.1)

## 2017-10-29 LAB — URINALYSIS, COMPLETE (UACMP) WITH MICROSCOPIC
Bacteria, UA: NONE SEEN
Bilirubin Urine: NEGATIVE
GLUCOSE, UA: NEGATIVE mg/dL
HGB URINE DIPSTICK: NEGATIVE
Ketones, ur: 5 mg/dL — AB
Leukocytes, UA: NEGATIVE
NITRITE: NEGATIVE
Protein, ur: NEGATIVE mg/dL
SPECIFIC GRAVITY, URINE: 1.004 — AB (ref 1.005–1.030)
Squamous Epithelial / LPF: NONE SEEN (ref 0–5)
pH: 6 (ref 5.0–8.0)

## 2017-10-29 LAB — CBC WITH DIFFERENTIAL/PLATELET
BASOS ABS: 0.1 10*3/uL (ref 0–0.1)
Basophils Relative: 1 %
EOS ABS: 0.1 10*3/uL (ref 0–0.7)
EOS PCT: 1 %
HCT: 43.5 % (ref 40.0–52.0)
Hemoglobin: 15.1 g/dL (ref 13.0–18.0)
Lymphocytes Relative: 17 %
Lymphs Abs: 1.6 10*3/uL (ref 1.0–3.6)
MCH: 32.8 pg (ref 26.0–34.0)
MCHC: 34.7 g/dL (ref 32.0–36.0)
MCV: 94.5 fL (ref 80.0–100.0)
Monocytes Absolute: 0.7 10*3/uL (ref 0.2–1.0)
Monocytes Relative: 7 %
Neutro Abs: 6.9 10*3/uL — ABNORMAL HIGH (ref 1.4–6.5)
Neutrophils Relative %: 74 %
PLATELETS: 204 10*3/uL (ref 150–440)
RBC: 4.61 MIL/uL (ref 4.40–5.90)
RDW: 13.6 % (ref 11.5–14.5)
WBC: 9.4 10*3/uL (ref 3.8–10.6)

## 2017-10-29 LAB — URINE DRUG SCREEN, QUALITATIVE (ARMC ONLY)
AMPHETAMINES, UR SCREEN: NOT DETECTED
Barbiturates, Ur Screen: NOT DETECTED
Cannabinoid 50 Ng, Ur ~~LOC~~: NOT DETECTED
Cocaine Metabolite,Ur ~~LOC~~: NOT DETECTED
MDMA (Ecstasy)Ur Screen: NOT DETECTED
METHADONE SCREEN, URINE: NOT DETECTED
OPIATE, UR SCREEN: NOT DETECTED
Phencyclidine (PCP) Ur S: NOT DETECTED
Tricyclic, Ur Screen: NOT DETECTED

## 2017-10-29 LAB — TROPONIN I
TROPONIN I: 0.24 ng/mL — AB (ref <0.03)
TROPONIN I: 0.3 ng/mL — AB (ref <0.03)
Troponin I: 0.1 ng/mL (ref <0.03)

## 2017-10-29 LAB — PROTIME-INR
INR: 0.95
PROTHROMBIN TIME: 12.6 s (ref 11.4–15.2)

## 2017-10-29 LAB — CK: CK TOTAL: 68 U/L (ref 49–397)

## 2017-10-29 LAB — LACTIC ACID, PLASMA
LACTIC ACID, VENOUS: 1.9 mmol/L (ref 0.5–1.9)
Lactic Acid, Venous: 2 mmol/L (ref 0.5–1.9)

## 2017-10-29 MED ORDER — ASPIRIN EC 81 MG PO TBEC
81.0000 mg | DELAYED_RELEASE_TABLET | Freq: Every day | ORAL | Status: DC
  Administered 2017-10-29 – 2017-10-30 (×2): 81 mg via ORAL
  Filled 2017-10-29 (×2): qty 1

## 2017-10-29 MED ORDER — ONDANSETRON HCL 4 MG PO TABS
4.0000 mg | ORAL_TABLET | Freq: Four times a day (QID) | ORAL | Status: DC | PRN
Start: 2017-10-29 — End: 2017-10-30

## 2017-10-29 MED ORDER — SODIUM CHLORIDE 0.9 % IV BOLUS
1000.0000 mL | Freq: Once | INTRAVENOUS | Status: AC
  Administered 2017-10-29: 1000 mL via INTRAVENOUS

## 2017-10-29 MED ORDER — ACETAMINOPHEN 650 MG RE SUPP: 650.0000 mg | Freq: Four times a day (QID) | RECTAL | Status: DC | PRN

## 2017-10-29 MED ORDER — ACETAMINOPHEN 325 MG PO TABS: 650.0000 mg | ORAL_TABLET | Freq: Four times a day (QID) | ORAL | Status: DC | PRN

## 2017-10-29 MED ORDER — ONDANSETRON HCL 4 MG/2ML IJ SOLN: 4.0000 mg | Freq: Four times a day (QID) | INTRAMUSCULAR | Status: DC | PRN

## 2017-10-29 MED ORDER — NITROGLYCERIN 0.4 MG SL SUBL: 0.4000 mg | SUBLINGUAL_TABLET | SUBLINGUAL | Status: DC | PRN

## 2017-10-29 MED ORDER — ENOXAPARIN SODIUM 40 MG/0.4ML ~~LOC~~ SOLN
40.0000 mg | SUBCUTANEOUS | Status: DC
  Administered 2017-10-29: 40 mg via SUBCUTANEOUS
  Filled 2017-10-29: qty 0.4

## 2017-10-29 MED ORDER — CARVEDILOL 3.125 MG PO TABS
3.1250 mg | ORAL_TABLET | Freq: Two times a day (BID) | ORAL | Status: DC
  Administered 2017-10-29: 3.125 mg via ORAL
  Filled 2017-10-29: qty 1

## 2017-10-29 MED ORDER — LISINOPRIL 20 MG PO TABS
20.0000 mg | ORAL_TABLET | Freq: Every day | ORAL | Status: DC
  Administered 2017-10-30: 20 mg via ORAL
  Filled 2017-10-29: qty 1

## 2017-10-29 MED ORDER — AMLODIPINE BESYLATE 5 MG PO TABS
5.0000 mg | ORAL_TABLET | Freq: Every day | ORAL | Status: DC
  Administered 2017-10-30: 5 mg via ORAL
  Filled 2017-10-29: qty 1

## 2017-10-29 MED ORDER — ROSUVASTATIN CALCIUM 10 MG PO TABS
20.0000 mg | ORAL_TABLET | Freq: Every day | ORAL | Status: DC
  Administered 2017-10-29: 20 mg via ORAL
  Filled 2017-10-29: qty 2

## 2017-10-29 NOTE — ED Notes (Addendum)
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 When asked further questions pts "stroke" sounds similar to a syncopal episode. Neuro exam is unremarkable for acute symptoms with an NIH of 2 at this time. PT has noted right sided facial droop but upon examination of pts licence this is chronic.   Pt is alert to self, place and situation but was unsure of the time. Pt verbalized belief that it was 2003. Otherwise pt is alert and able to answer questions appropriately.

## 2017-10-29 NOTE — ED Notes (Signed)
Patient transported to X-ray 

## 2017-10-29 NOTE — H&P (Signed)
[place] [facility] [place] NAME: Adam Good    MR#:  [id number]  DATE OF BIRTH:  Dec 24, 1945  DATE OF ADMISSION:  10/29/2017  PRIMARY CARE PHYSICIAN: Patient, No Pcp Per   REQUESTING/REFERRING PHYSICIAN: Dr. Conni Slipper  CHIEF COMPLAINT:   Chief Complaint  Patient presents with  . Altered Mental Status    HISTORY OF PRESENT ILLNESS:  Adam Good  is a 72 y.o. male with a known history of alcohol abuse, bipolar disorder, hypertension, hyperlipidemia, obstructive sleep apnea who apparently was discharged from jail earlier today and was walking to [employer] and then dizzy and lightheaded and sat on the ground and a bystander noticed him to be lying down and passed out and therefore called EMS and he was brought to the hospital.  Patient cannot recall how he ended up here in the emergency room, his routine work-up has been essentially benign except he was noted to have a mildly elevated troponin of 0.10.  Patient denies any chest pains, nausea, vomiting, headache, dizziness, fever chills or any other associated symptoms presently.  Patient's EKG also does not show any acute ST or T wave changes.  Given his mildly elevated troponin hospitalist services were contacted for admission.  PAST MEDICAL HISTORY:   Past Medical History:  Diagnosis Date  . Alcohol abuse   . Bipolar disorder ([place])   . Demand ischemia of myocardium [facility]) Nov 2015   Positive troponins in the setting of a fall during intoxication; Myoview February 2016: EF 65-70%, no evidence of ischemia or infarction. No RWMA.  . Dyslipidemia    On statin: FLP 12/2013: TC 143, TG 144, HDL 45, LDL 69  . Essential hypertension   . Gastroesophageal reflux disease   . Heart murmur    Not heard on exam  . Sleep apnea     PAST SURGICAL HISTORY:   Past Surgical History:  Procedure Laterality Date  . HERNIA REPAIR    . KNEE SURGERY    . NM MYOVIEW LTD  Jan 2009; 05/12/14   a) No  ischemia/infarct. Normal EF - 73%; b) EF 65-70%, no evidence of ischemia or infarction. No RWMA.    SOCIAL HISTORY:   Social History   Tobacco Use  . Smoking status: Current Every Day Smoker    Packs/day: 0.50    Years: 57.00    Pack years: 28.50    Types: Cigarettes  . Smokeless tobacco: Current User    Types: Snuff  Substance Use Topics  . Alcohol use: Yes    Alcohol/week: 0.0 standard drinks    Comment: He has been a heavy alcohol drinker for > 30 yrs.  Stats that he quit 3 weeks ago.     FAMILY HISTORY:   Family History  Problem Relation Age of Onset  . Throat cancer Mother   . Cancer Father     DRUG ALLERGIES:  No Known Allergies  REVIEW OF SYSTEMS:   Review of Systems  Constitutional: Negative for fever and weight loss.  HENT: Negative for congestion, nosebleeds and tinnitus.   Eyes: Negative for blurred vision, double vision and redness.  Respiratory: Negative for cough, hemoptysis and shortness of breath.   Cardiovascular: Negative for chest pain, orthopnea, leg swelling and PND.  Gastrointestinal: Negative for abdominal pain, diarrhea, melena, nausea and vomiting.  Genitourinary: Negative for dysuria, hematuria and urgency.  Musculoskeletal: Negative for falls and joint pain.  Neurological: Negative for dizziness, tingling, sensory change, focal weakness, seizures, weakness  and headaches.  Endo/Heme/Allergies: Negative for polydipsia. Does not bruise/bleed easily.  Psychiatric/Behavioral: Negative for depression and memory loss. The patient is not nervous/anxious.     MEDICATIONS AT HOME:   Prior to Admission medications   Medication Sig Start Date End Date Taking? Authorizing Provider  amLODipine (NORVASC) 5 MG tablet Take 5 mg by mouth daily.   Yes [provider]  aspirin 81 MG chewable tablet Chew 81 mg by mouth daily.   Yes [provider]  carvedilol (COREG) 3.125 MG tablet Take 3.125 mg by mouth 2 (two) times daily.   Yes  [provider]  lisinopril (PRINIVIL,ZESTRIL) 20 MG tablet Take 20 mg by mouth daily.   Yes [provider]  rosuvastatin (CRESTOR) 20 MG tablet Take 20 mg by mouth at bedtime.   Yes [provider]      VITAL SIGNS:  Blood pressure 135/76, pulse 98, resp. rate (!) 28, height 5' 8.5" (1.74 m), weight 65.9 kg, SpO2 97 %.  PHYSICAL EXAMINATION:  Physical Exam  GENERAL:  72 y.o.-year-old patient lying in the bed in no acute distress.  EYES: Pupils equal, round, reactive to light and accommodation. No scleral icterus. Extraocular muscles intact.  HEENT: Head atraumatic, normocephalic. Oropharynx and nasopharynx clear. No oropharyngeal erythema, moist oral mucosa  NECK:  Supple, no jugular venous distention. No thyroid enlargement, no tenderness.  LUNGS: Normal breath sounds bilaterally, no wheezing, rales, rhonchi. No use of accessory muscles of respiration.  CARDIOVASCULAR: S1, S2 RRR. No murmurs, rubs, gallops, clicks.  ABDOMEN: Soft, nontender, nondistended. Bowel sounds present. No organomegaly or mass.  EXTREMITIES: No pedal edema, cyanosis, or clubbing. + 2 pedal & radial pulses b/l.   NEUROLOGIC: Cranial nerves II through XII are intact. No focal Motor or sensory deficits appreciated b/l PSYCHIATRIC: The patient is alert and oriented x 3.  SKIN: No obvious rash, lesion, or ulcer.   LABORATORY PANEL:   CBC Recent Labs  Lab 10/29/17 1336  WBC 9.4  HGB 15.1  HCT 43.5  PLT 204   ------------------------------------------------------------------------------------------------------------------  Chemistries  Recent Labs  Lab 10/29/17 1336  NA 140  K 3.6  CL 106  CO2 23  GLUCOSE 101*  BUN 15  CREATININE 1.09  CALCIUM 8.9  AST 32  ALT 23  ALKPHOS 69  BILITOT 0.8   ------------------------------------------------------------------------------------------------------------------  Cardiac Enzymes Recent Labs  Lab 10/29/17 1336  TROPONINI  0.10*   ------------------------------------------------------------------------------------------------------------------  RADIOLOGY:  Ct Head Wo Contrast  Result Date: 10/29/2017 CLINICAL DATA:  Altered mental status EXAM: CT HEAD WITHOUT CONTRAST TECHNIQUE: Contiguous axial images were obtained from the base of the skull through the vertex without intravenous contrast. COMPARISON:  CT brain 07/28/2015 FINDINGS: Brain: No acute territorial infarction, hemorrhage or intracranial mass. Old left cerebellar infarct. Old lacunar infarct in the left basal ganglia. Moderate atrophy. Mild small vessel ischemic changes of the white matter. Stable ventricle size Vascular: No hyperdense vessels.  Carotid vascular calcification Skull: Normal. Negative for fracture or focal lesion. Sinuses/Orbits: No acute finding. Other: None IMPRESSION: 1. No CT evidence for acute intracranial abnormality. 2. Atrophy and small vessel ischemic changes of the white matter. Chronic left cerebellar infarct Electronically Signed   By: Donavan Foil M.D.   On: 10/29/2017 14:17   Dg Chest Portable 1 View  Result Date: 10/29/2017 CLINICAL DATA:  Altered mental status.  Hypertension. EXAM: PORTABLE CHEST 1 VIEW COMPARISON:  May 24, 2015 FINDINGS: No edema or consolidation. Heart size and pulmonary vascularity are normal. No adenopathy.  No pneumothorax. There old healed rib fractures on the left. IMPRESSION: No edema or consolidation.  Stable cardiac silhouette. Electronically Signed   By: Lowella Grip III M.D.   On: 10/29/2017 13:53     IMPRESSION AND PLAN:   72 year old male with past medical history of hypertension, hyperlipidemia, tobacco abuse, alcohol abuse who presents to the hospital due to altered mental status and noted to have a mildly elevated troponin.  1.  Altered mental status/confusion-etiology unclear but likely related to heat exhaustion/dehydration. -Patient CT head is negative on admission.  Patient was  walking outside and apparently a bystander noticed him lying on the ground.  Patient cannot recall the events. -Continue supportive care, follow mental status which is already improved since patient has been in the hospital.  2.  Elevated troponin- etiology unclear.  She does have risk factors given his tobacco abuse, alcohol abuse and history of hypertension hyperlipidemia. -Presently he is chest pain-free and hemodynamically stable.  I will observe him on telemetry, cycle his cardiac markers. -Continue aspirin, beta-blocker, statin.  Will get a nuclear medicine stress test in the morning.  3.  Essential hypertension-continue Norvasc, carvedilol, lisinopril.  4.  Hyperlipidemia-continue Crestor.     All the records are reviewed and case discussed with ED provider. Management plans discussed with the patient, family and they are in agreement.  CODE STATUS: full code  TOTAL TIME TAKING CARE OF THIS PATIENT: 40 minutes.    Henreitta Leber M.D on 10/29/2017 at 4:02 PM  Between 7am to 6pm - Pager - [phone]  After 6pm go to [email or web address] - password [email or web address] [facility]  Office  [phone]  CC: Primary care physician; Patient, No Pcp Per

## 2017-10-29 NOTE — ED Provider Notes (Signed)
[facility] Emergency Department Provider Note   ____________________________________________   First MD Initiated Contact with Patient 10/29/17 1336     (approximate)  I have reviewed the triage vital signs and the nursing notes.   HISTORY  Chief Complaint Altered Mental Status  History limited by altered mental status  HPI AZIZ SLAPE is a 72 y.o. male patient found in the middle of the road by a bystander.  EMS arrived patient tells them he was walking from jail where he was discharged at 11 o'clock today.  He does not know the day or the year.  He appears to be confused.  Cannot get further history except for that he said he had an episode of chest pain last night in the jail.  Past Medical History:  Diagnosis Date  . Alcohol abuse   . Bipolar disorder ([place])   . Demand ischemia of myocardium [facility]) Nov 2015   Positive troponins in the setting of a fall during intoxication; Myoview February 2016: EF 65-70%, no evidence of ischemia or infarction. No RWMA.  . Dyslipidemia    On statin: FLP 12/2013: TC 143, TG 144, HDL 45, LDL 69  . Essential hypertension   . Gastroesophageal reflux disease   . Heart murmur    Not heard on exam  . Sleep apnea     Patient Active Problem List   Diagnosis Date Noted  . Dyslipidemia   . Essential hypertension   . Alcohol abuse   . Heart murmur   . Demand ischemia of myocardium ([place]) 01/17/2014    Past Surgical History:  Procedure Laterality Date  . HERNIA REPAIR    . KNEE SURGERY    . NM MYOVIEW LTD  Jan 2009; 05/12/14   a) No ischemia/infarct. Normal EF - 73%; b) EF 65-70%, no evidence of ischemia or infarction. No RWMA.    Prior to Admission medications   Medication Sig Start Date End Date Taking? Authorizing Provider  albuterol (PROVENTIL HFA;VENTOLIN HFA) 108 (90 BASE) MCG/ACT inhaler Inhale 2 puffs into the lungs every 4 (four) hours as needed for wheezing or shortness of breath.    [provider]  amLODipine (NORVASC) 5 MG tablet Take 5 mg by mouth daily.    [provider]  hydrochlorothiazide (HYDRODIURIL) 25 MG tablet Take 25 mg by mouth daily.    [provider]  lisinopril (PRINIVIL,ZESTRIL) 20 MG tablet Take 20 mg by mouth daily.    [provider]  rosuvastatin (CRESTOR) 20 MG tablet Take 20 mg by mouth at bedtime.    [provider]    Allergies Patient has no known allergies.  Family History  Problem Relation Age of Onset  . Cancer Father     Social History Social History   Tobacco Use  . Smoking status: Current Every Day Smoker    Packs/day: 0.50    Years: 57.00    Pack years: 28.50    Types: Cigarettes  . Smokeless tobacco: Current User    Types: Snuff  Substance Use Topics  . Alcohol use: Yes    Alcohol/week: 0.0 standard drinks    Comment: He has been a heavy alcohol drinker for > 30 yrs.  Stats that he quit 3 weeks ago.   . Drug use: No    Review of Systems  Constitutional: No subjective fever/chills EMS did get a fever of 101.7 when they picked him up Eyes: No visual changes. ENT: No sore throat. Cardiovascular: Denies chest pain.  Respiratory: Denies shortness of breath. Gastrointestinal: No abdominal pain.  No nausea, no vomiting.  No diarrhea.  No constipation. Genitourinary: Negative for dysuria. Musculoskeletal: Negative for back pain. Skin: Negative for rash. Neurological: Negative for headaches, focal weakness   ____________________________________________   PHYSICAL EXAM:  VITAL SIGNS: ED Triage Vitals  Enc Vitals Group     BP 10/29/17 1335 135/76     Pulse Rate 10/29/17 1335 98     Resp 10/29/17 1335 (!) 28     Temp --      Temp Source 10/29/17 1335 Rectal     SpO2 10/29/17 1333 95 %     Weight --      Height --      Head Circumference --      Peak Flow --      Pain Score 10/29/17 1335 0     Pain Loc --      Pain Edu? --      Excl. in [place]? --     Constitutional: Alert  and oriented to person. Well appearing and in no acute distress. Eyes: Conjunctivae are normal. PERRL. EOMI. Head: Atraumatic. Nose: No congestion/rhinnorhea. Mouth/Throat: Mucous membranes are moist.  Oropharynx non-erythematous. Neck: No stridor.  Neck is supple  cardiovascular: Normal rate, regular rhythm. Grossly normal heart sounds.  Good peripheral circulation. Respiratory: Normal respiratory effort.  No retractions. Lungs CTAB. Gastrointestinal: Soft and nontender. No distention. No abdominal bruits. No CVA tenderness. Musculoskeletal: No lower extremity tenderness nor edema.  No joint effusions. Neurologic:  Normal speech and language. No gross focal neurologic deficits are appreciated.  Skin:  Skin is warm, dry and intact. No rash noted. Psychiatric: Mood and affect are normal. Speech and behavior are normal.  ____________________________________________   LABS (all labs ordered are listed, but only abnormal results are displayed)  Labs Reviewed  COMPREHENSIVE METABOLIC PANEL - Abnormal; Notable for the following components:      Result Value   Glucose, Bld 101 (*)    All other components within normal limits  CBC WITH DIFFERENTIAL/PLATELET - Abnormal; Notable for the following components:   Neutro Abs 6.9 (*)    All other components within normal limits  TROPONIN I - Abnormal; Notable for the following components:   Troponin I 0.10 (*)    All other components within normal limits  CULTURE, BLOOD (ROUTINE X 2)  CULTURE, BLOOD (ROUTINE X 2)  LACTIC ACID, PLASMA  PROTIME-INR  CK  LACTIC ACID, PLASMA  URINALYSIS, COMPLETE (UACMP) WITH MICROSCOPIC  URINE DRUG SCREEN, QUALITATIVE ([facility] ONLY)   ____________________________________________  EKG  EKG read and interpreted by me shows sinus rhythm at 92 left axis there are flipped T's inferiorly and laterally.  There are flipped T in lead II and in V5 are new from March the T in 1 and L and V4 are somewhat deeper than they  were March ____________________________________________  RADIOLOGY  ED MD interpretation:   Official radiology report(s): Ct Head Wo Contrast  Result Date: 10/29/2017 CLINICAL DATA:  Altered mental status EXAM: CT HEAD WITHOUT CONTRAST TECHNIQUE: Contiguous axial images were obtained from the base of the skull through the vertex without intravenous contrast. COMPARISON:  CT brain 07/28/2015 FINDINGS: Brain: No acute territorial infarction, hemorrhage or intracranial mass. Old left cerebellar infarct. Old lacunar infarct in the left basal ganglia. Moderate atrophy. Mild small vessel ischemic changes of the white matter. Stable ventricle size Vascular: No hyperdense vessels.  Carotid vascular calcification Skull: Normal. Negative for fracture or focal lesion. Sinuses/Orbits:  No acute finding. Other: None IMPRESSION: 1. No CT evidence for acute intracranial abnormality. 2. Atrophy and small vessel ischemic changes of the white matter. Chronic left cerebellar infarct Electronically Signed   By: Donavan Foil M.D.   On: 10/29/2017 14:17   Dg Chest Portable 1 View  Result Date: 10/29/2017 CLINICAL DATA:  Altered mental status.  Hypertension. EXAM: PORTABLE CHEST 1 VIEW COMPARISON:  May 24, 2015 FINDINGS: No edema or consolidation. Heart size and pulmonary vascularity are normal. No adenopathy. No pneumothorax. There old healed rib fractures on the left. IMPRESSION: No edema or consolidation.  Stable cardiac silhouette. Electronically Signed   By: Lowella Grip III M.D.   On: 10/29/2017 13:53    ____________________________________________   PROCEDURES  Procedure(s) performed:   Procedures  Critical Care performed:   ____________________________________________   INITIAL IMPRESSION / ASSESSMENT AND PLAN / ED COURSE  ----------------------------------------- 2:32 PM on 10/29/2017 -----------------------------------------  Patient knows who he is not where he is he still thinks is  2003 though he does not appear to be confused and can hold a reasonable conversation.  Now says he passed out last night.  He did have 101.4 temperature rectally.  At this point am uncertain if he just got overheated while he was walking or he did actually have an infection.  His chest x-ray is negative his head CT is negative he has no complaints of pain or soreness anywhere no more so with no sore throat no dysuria nothing.  Will wait to get his urinalysis back.         ____________________________________________   FINAL CLINICAL IMPRESSION(S) / ED DIAGNOSES  Final diagnoses:  Altered mental status, unspecified altered mental status type  Elevated troponin  Fever, unspecified fever cause     ED Discharge Orders    None       Note:  This document was prepared using Dragon voice recognition software and may include unintentional dictation errors.    Nena Polio, MD 10/29/17 1534

## 2017-10-29 NOTE — ED Triage Notes (Signed)
Pt to ED via EMS after bystander called reporting pt was laying in the road. Altered mental status upon arrival. Warm to the touch with unknown downtime. Pt was released from jail today and hx of the day is unknown at this time. Pt is alert but a poor historian.

## 2017-10-30 ENCOUNTER — Observation Stay (HOSPITAL_BASED_OUTPATIENT_CLINIC_OR_DEPARTMENT_OTHER)
Admit: 2017-10-30 | Discharge: 2017-10-30 | Disposition: A | Discharge status: Home / Self Care | Payer: Medicare Other | Attending: Cardiovascular Disease | Admitting: Cardiovascular Disease

## 2017-10-30 ENCOUNTER — Observation Stay (HOSPITAL_BASED_OUTPATIENT_CLINIC_OR_DEPARTMENT_OTHER): Payer: Medicare Other

## 2017-10-30 ENCOUNTER — Encounter: Payer: Self-pay | Admitting: Physician Assistant

## 2017-10-30 DIAGNOSIS — I248 Other forms of acute ischemic heart disease: Secondary | ICD-10-CM | POA: Diagnosis not present

## 2017-10-30 DIAGNOSIS — R509 Fever, unspecified: Secondary | ICD-10-CM

## 2017-10-30 DIAGNOSIS — I517 Cardiomegaly: Secondary | ICD-10-CM

## 2017-10-30 DIAGNOSIS — I77810 Thoracic aortic ectasia: Secondary | ICD-10-CM

## 2017-10-30 DIAGNOSIS — I340 Nonrheumatic mitral (valve) insufficiency: Secondary | ICD-10-CM

## 2017-10-30 DIAGNOSIS — I350 Nonrheumatic aortic (valve) stenosis: Secondary | ICD-10-CM

## 2017-10-30 DIAGNOSIS — R4182 Altered mental status, unspecified: Secondary | ICD-10-CM | POA: Diagnosis not present

## 2017-10-30 LAB — NM MYOCAR MULTI W/SPECT W/WALL MOTION / EF
CHL CUP NUCLEAR SDS: 0
CHL CUP NUCLEAR SRS: 0
CHL CUP RESTING HR STRESS: 51 {beats}/min
CSEPPHR: 78 {beats}/min
LVDIAVOL: 53 mL (ref 62–150)
LVSYSVOL: 14 mL
NUC STRESS TID: 1.17
Percent HR: 52 %
SSS: 0

## 2017-10-30 LAB — ECHOCARDIOGRAM COMPLETE
HEIGHTINCHES: 68 in
WEIGHTICAEL: 2308.8 [oz_av]

## 2017-10-30 LAB — LIPID PANEL
Cholesterol: 170 mg/dL (ref 0–200)
HDL: 40 mg/dL — AB (ref >40)
LDL Cholesterol: 114 mg/dL — ABNORMAL HIGH (ref 0–99)
Total CHOL/HDL Ratio: 4.3 RATIO
Triglycerides: 81 mg/dL (ref <150)
VLDL: 16 mg/dL (ref 0–40)

## 2017-10-30 LAB — HIV ANTIBODY (ROUTINE TESTING W REFLEX): HIV SCREEN 4TH GENERATION: NONREACTIVE

## 2017-10-30 LAB — TROPONIN I: TROPONIN I: 0.24 ng/mL — AB (ref <0.03)

## 2017-10-30 MED ORDER — TECHNETIUM TC 99M TETROFOSMIN IV KIT
12.6000 | PACK | Freq: Once | INTRAVENOUS | Status: AC | PRN
  Administered 2017-10-30: 12.6 via INTRAVENOUS

## 2017-10-30 MED ORDER — TECHNETIUM TC 99M TETROFOSMIN IV KIT
30.0000 | PACK | Freq: Once | INTRAVENOUS | Status: AC | PRN
  Administered 2017-10-30: 31.877 via INTRAVENOUS

## 2017-10-30 MED ORDER — REGADENOSON 0.4 MG/5ML IV SOLN
0.4000 mg | Freq: Once | INTRAVENOUS | Status: AC
  Administered 2017-10-30: 0.4 mg via INTRAVENOUS

## 2017-10-30 NOTE — Progress Notes (Signed)
*  PRELIMINARY RESULTS* Echocardiogram 2D Echocardiogram has been performed.  Adam Good 10/30/2017, 10:03 AM

## 2017-10-30 NOTE — Discharge Instructions (Signed)

## 2017-10-30 NOTE — Care Management Obs Status (Signed)
[facility] NOTIFICATION   Patient Details  Name: Adam Good MRN: [id number] Date of Birth: [birth year]   Medicare Observation Status Notification Given:  No Discharge order placed in < 24hr of being placed on observation   Katrina Stack, RN 10/30/2017, 2:04 PM

## 2017-10-30 NOTE — Discharge Summary (Signed)
[facility], 72 y.o., DOB Mar 08, 1946, MRN [id number]. Admission date: 10/29/2017 Discharge Date 10/30/2017 Primary MD Patient, No Pcp Per Admitting Physician Henreitta Leber, MD  Admission Diagnosis  Elevated troponin [R74.8] Fever, unspecified fever cause [R50.9] Altered mental status, unspecified altered mental status type [R41.82]  Discharge Diagnosis   Active Problems: Acute encephalopathy likely due to dehydration and heat exhaustion Elevated troponin due to demand ischemia status post stress test with low risk scan Moderate to severe aortic stenosis Essential hypertension Hyperlipidemia   Hospital Course Adam Good  is a 72 y.o. male with a known history of alcohol abuse, bipolar disorder, hypertension, hyperlipidemia, obstructive sleep apnea who apparently was discharged from jail earlier today and was walking to [employer] and then dizzy and lightheaded and sat on the ground and a bystander noticed him to be lying down and passed out and therefore called EMS and he was brought to the hospital.  Patient was seen in the room.  Evaluation was negative.  His troponin was slightly abnormal.  He appeared to be a little dehydrated.  He was given fluids his cardiac enzymes were cycled.  Underwent a stress test today.  Which shows no evidence of ischemia.  Patient is feeling back to his baseline and stable for discharge.             Consults  cardiology  Significant Tests:  See full reports for all details     Ct Head Wo Contrast  Result Date: 10/29/2017 CLINICAL DATA:  Altered mental status EXAM: CT HEAD WITHOUT CONTRAST TECHNIQUE: Contiguous axial images were obtained from the base of the skull through the vertex without intravenous contrast. COMPARISON:  CT brain 07/28/2015 FINDINGS: Brain: No acute territorial infarction, hemorrhage or intracranial mass. Old left cerebellar infarct. Old lacunar infarct in the left  basal ganglia. Moderate atrophy. Mild small vessel ischemic changes of the white matter. Stable ventricle size Vascular: No hyperdense vessels.  Carotid vascular calcification Skull: Normal. Negative for fracture or focal lesion. Sinuses/Orbits: No acute finding. Other: None IMPRESSION: 1. No CT evidence for acute intracranial abnormality. 2. Atrophy and small vessel ischemic changes of the white matter. Chronic left cerebellar infarct Electronically Signed   By: Donavan Foil M.D.   On: 10/29/2017 14:17   Nm Myocar Multi W/spect W/wall Motion / Ef  Result Date: 10/30/2017 Pharmacological myocardial perfusion imaging study with no significant  ischemia Normal wall motion, EF estimated at 58% No EKG changes concerning for ischemia at peak stress or in recovery. Resting EKG with LVH/repolarization abnormality, T-wave abnormality precordial leads, inferior leads Low risk scan Signed, Esmond Plants, MD, Ph.D [facility]   Dg Chest Portable 1 View  Result Date: 10/29/2017 CLINICAL DATA:  Altered mental status.  Hypertension. EXAM: PORTABLE CHEST 1 VIEW COMPARISON:  May 24, 2015 FINDINGS: No edema or consolidation. Heart size and pulmonary vascularity are normal. No adenopathy. No pneumothorax. There old healed rib fractures on the left. IMPRESSION: No edema or consolidation.  Stable cardiac silhouette. Electronically Signed   By: Lowella Grip III M.D.   On: 10/29/2017 13:53       Today   Subjective:   Adam Good patient doing well denies any chest pain o Objective:   Blood pressure 115/81, pulse (!) 48, temperature 98 F (36.7 C), temperature source Oral, resp. rate 16, height 5\' 8"  (1.727 m), weight 65.5 kg, SpO2 99 %.  .  Intake/Output Summary (Last 24 hours) at 10/30/2017  1508 Last data filed at 10/30/2017 0732 Gross per 24 hour  Intake 1000 ml  Output 775 ml  Net 225 ml    Exam VITAL SIGNS: Blood pressure 115/81, pulse (!) 48, temperature 98 F (36.7 C), temperature source  Oral, resp. rate 16, height 5\' 8"  (1.727 m), weight 65.5 kg, SpO2 99 %.  GENERAL:  72 y.o.-year-old patient lying in the bed with no acute distress.  EYES: Pupils equal, round, reactive to light and accommodation. No scleral icterus. Extraocular muscles intact.  HEENT: Head atraumatic, normocephalic. Oropharynx and nasopharynx clear.  NECK:  Supple, no jugular venous distention. No thyroid enlargement, no tenderness.  LUNGS: Normal breath sounds bilaterally, no wheezing, rales,rhonchi or crepitation. No use of accessory muscles of respiration.  CARDIOVASCULAR: S1, S2 normal. No murmurs, rubs, or gallops.  ABDOMEN: Soft, nontender, nondistended. Bowel sounds present. No organomegaly or mass.  EXTREMITIES: No pedal edema, cyanosis, or clubbing.  NEUROLOGIC: Cranial nerves II through XII are intact. Muscle strength 5/5 in all extremities. Sensation intact. Gait not checked.  PSYCHIATRIC: The patient is alert and oriented x 3.  SKIN: No obvious rash, lesion, or ulcer.   Data Review     CBC w Diff:  Lab Results  Component Value Date   WBC 9.4 10/29/2017   HGB 15.1 10/29/2017   HGB 12.2 (L) 01/21/2014   HCT 43.5 10/29/2017   HCT 35.5 (L) 01/21/2014   PLT 204 10/29/2017   PLT 130 (L) 01/21/2014   LYMPHOPCT 17 10/29/2017   LYMPHOPCT 37.3 01/21/2014   MONOPCT 7 10/29/2017   MONOPCT 10.3 01/21/2014   EOSPCT 1 10/29/2017   EOSPCT 1.7 01/21/2014   BASOPCT 1 10/29/2017   BASOPCT 0.4 01/21/2014   CMP:  Lab Results  Component Value Date   NA 140 10/29/2017   NA 140 01/21/2014   K 3.6 10/29/2017   K 3.8 01/21/2014   CL 106 10/29/2017   CL 108 (H) 01/21/2014   CO2 23 10/29/2017   CO2 25 01/21/2014   BUN 15 10/29/2017   BUN 18 01/21/2014   CREATININE 1.09 10/29/2017   CREATININE 0.94 01/21/2014   PROT 6.5 10/29/2017   ALBUMIN 4.1 10/29/2017   BILITOT 0.8 10/29/2017   ALKPHOS 69 10/29/2017   AST 32 10/29/2017   ALT 23 10/29/2017  .  Micro Results Recent Results (from the  past 240 hour(s))  Culture, blood (Routine x 2)     Status: None (Preliminary result)   Collection Time: 10/29/17  1:36 PM  Result Value Ref Range Status   Specimen Description BLOOD BLOOD RIGHT FOREARM  Final   Special Requests   Final    BOTTLES DRAWN AEROBIC AND ANAEROBIC Blood Culture results may not be optimal due to an excessive volume of blood received in culture bottles   Culture   Final    NO GROWTH < 24 HOURS Performed at [facility], [address], [place], [place] [address]    Report Status PENDING  Incomplete  Culture, blood (Routine x 2)     Status: None (Preliminary result)   Collection Time: 10/29/17  1:36 PM  Result Value Ref Range Status   Specimen Description BLOOD BLOOD RIGHT WRIST  Final   Special Requests   Final    BOTTLES DRAWN AEROBIC AND ANAEROBIC Blood Culture adequate volume   Culture   Final    NO GROWTH < 24 HOURS Performed at [facility], [address] [place], [place] [address]    Report Status PENDING  Incomplete        Code Status Orders  (From admission, onward)         Start     Ordered   10/29/17 1655  Full code  Continuous     10/29/17 1654        Code Status History    This patient has a current code status but no historical code status.            Discharge Medications   Allergies as of 10/30/2017   No Known Allergies     Medication List    TAKE these medications   amLODipine 5 MG tablet Commonly known as:  NORVASC Take 5 mg by mouth daily.   aspirin 81 MG chewable tablet Chew 81 mg by mouth daily.   carvedilol 3.125 MG tablet Commonly known as:  COREG Take 3.125 mg by mouth 2 (two) times daily.   lisinopril 20 MG tablet Commonly known as:  PRINIVIL,ZESTRIL Take 20 mg by mouth daily.   rosuvastatin 20 MG tablet Commonly known as:  CRESTOR Take 20 mg by mouth at bedtime.          Total Time in preparing paper work, data evaluation and todays exam - 84 minutes  Dustin Flock M.D on 10/30/2017 at 3:08 PM [place]  [phone]

## 2017-10-30 NOTE — Care Management (Signed)
CM met with patient to assess due to lack of PCP.  Patient says he is catching a train tomorrow to return to [place] to live.  Confirmed he has Medicare D coverage. No there needs

## 2017-10-30 NOTE — Consult Note (Signed)
Cardiology Consultation:   Patient ID: Adam Good; [id number]; Apr 06, 1945   Admit date: 10/29/2017 Date of Consult: 10/30/2017  Primary Care Provider: Patient, No Pcp Per Primary Cardiologist: Prior Ellyn Hack patient in 2016, consult in 2019 by Adam Good    Patient Profile:   Adam Good is a 72 y.o. male with a hx of abnormal nuclear stress test, diastolic dysfunction, ongoing alcohol and tobacco abuse, recent incarceration, prior cerebellar stroke, ascending aortic aneurysm, bipolar disorder,homelessness, prior marijuana use, HTN, and HLD who is being seen today for the evaluation of chest pain with elevated troponin at the request of Dr. Verdell Carmine, MD.  History of Present Illness:   Adam Good previously presented to [facility] in 10/2014 from [place] for alcohol rehab medical clearance and was found to have hypotension and elevated troponin that peaked at 0.152. Echo showed an EF of 50-55%, Gr2DD, severely dilated LA, and moderate AS. Nuclear stress test showed mild ischemia in the mid anterolateral, mid anterior, basal anterolateral and basal anterior segments. Patient was offered cardiac catheterization but decided to pursue medical management.    He is known to have frequent ED visits for alcohol intoxication. He was admitted to [facility] in [place], [place] in 05/2017 generalized weakness and feeling cold. There was concern for possible dementia with his CT head showing advanced atrophy. His weakness was felt to be secondary to dehydration. He was noted to have a 4 cm borderline ascending aortic aneurysm on imaging. No source of possible infection was found. Echo on 06/04/2017 showed an EF of 50-55%, Gr1DD, mildly dilated left atrium, mildly calcified mitral leaflets, mild AS, mild TR, trace PR, RVSP 46 mmHg, trivial pericardial effusion was noted.   He was just released from jail on 10/29/2017 (spent 40 days incarcerated) for intoxication. While walking to  the street to go check into a motel he sat down on the ground because he was feeling weak and dizzy. Never with chest pain, SOB, palpitations, diaphoresis, presyncope, or syncope. A bystander came by to check on him and called EMS.   Upon the patient's arrival to [facility] they were found to have stable BP/HR, be afebrile, oxygen saturation 97% on room air, weight 65.9 kg. EKG as below, CXR showed no acute cardiopulmonary process with stable cardiac silhouette. CT head without evidence for acute intracranial abnormality with atrophy and small vessel ischemic changes as well as a chronic left cerebellar infarct. Labs showed troponin 0.10-->0.24-->0.30-->0.24, CK 68, Na 140, K+ 3.6, glucose 101, BUN 15, SCr 1.09, LFT normal, lactic acid 1.9-->2.0, WBC 9.4, HGB 15.1, PLT 204, INR 0.95, blood cultures pending x 2, UDS negative. Upon admission, he was given 1 L IV fluids, continued on home medications, stress test was ordered and cardiology was asked to evaluate. Currently, "I feel great."  Past Medical History:  Diagnosis Date  . Alcohol abuse   . Aortic stenosis   . Bipolar disorder ([place])   . Demand ischemia of myocardium ([place]) 01/2014   a. positive troponins in the setting of a fall during intoxication; b. Myoview 2/16: EF 65-70%, no evi of ischemia or infarct. No RWMA.; c. MV 9/16: mild ischemia in the mid anterolateral, mid anterior, basal anterolateral and basal anterior segments, patietn declined LHC  . Diastolic dysfunction    a. TTE 9/16: EF of 50-55%, Gr2DD, severely dilated LA, and moderate AS; b. TTE 3/19: EF of 50-55%, Gr1DD, mildly dilated left atrium, mildly calcified mitral leaflets, mild AS, mild TR, trace PR, RVSP  46 mmHg, trivial pericardial effusion  . Dyslipidemia   . Essential hypertension   . Gastroesophageal reflux disease   . HLD (hyperlipidemia)   . Sleep apnea   . Stroke [facility])     Past Surgical History:  Procedure Laterality Date  . HERNIA REPAIR    . KNEE SURGERY    . NM  MYOVIEW LTD  Jan 2009; 05/12/14   a) No ischemia/infarct. Normal EF - 73%; b) EF 65-70%, no evidence of ischemia or infarction. No RWMA.     Home Meds: Prior to Admission medications   Medication Sig Start Date End Date Taking? Authorizing Provider  amLODipine (NORVASC) 5 MG tablet Take 5 mg by mouth daily.   Yes [provider]  aspirin 81 MG chewable tablet Chew 81 mg by mouth daily.   Yes [provider]  carvedilol (COREG) 3.125 MG tablet Take 3.125 mg by mouth 2 (two) times daily.   Yes [provider]  lisinopril (PRINIVIL,ZESTRIL) 20 MG tablet Take 20 mg by mouth daily.   Yes [provider]  rosuvastatin (CRESTOR) 20 MG tablet Take 20 mg by mouth at bedtime.   Yes [provider]    Inpatient Medications: Scheduled Meds: . amLODipine  5 mg Oral Daily  . aspirin EC  81 mg Oral Daily  . carvedilol  3.125 mg Oral BID  . enoxaparin (LOVENOX) injection  40 mg Subcutaneous Q24H  . lisinopril  20 mg Oral Daily  . rosuvastatin  20 mg Oral QHS   Continuous Infusions:  PRN Meds: acetaminophen **OR** acetaminophen, nitroGLYCERIN, ondansetron **OR** ondansetron (ZOFRAN) IV  Allergies:  No Known Allergies  Social History:   Social History   Socioeconomic History  . Marital status: Married    Spouse name: Not on file  . Number of children: Not on file  . Years of education: Not on file  . Highest education level: Not on file  Occupational History  . Not on file  Social Needs  . Financial resource strain: Not on file  . Food insecurity:    Worry: Not on file    Inability: Not on file  . Transportation needs:    Medical: Not on file    Non-medical: Not on file  Tobacco Use  . Smoking status: Current Every Day Smoker    Packs/day: 0.50    Years: 57.00    Pack years: 28.50    Types: Cigarettes  . Smokeless tobacco: Current User    Types: Snuff  Substance and Sexual Activity  . Alcohol use: Yes    Alcohol/week: 0.0 standard  drinks    Comment: He has been a heavy alcohol drinker for > 30 yrs.  Stats that he quit 3 weeks ago.   . Drug use: No  . Sexual activity: Not on file  Lifestyle  . Physical activity:    Days per week: Not on file    Minutes per session: Not on file  . Stress: Not on file  Relationships  . Social connections:    Talks on phone: Not on file    Gets together: Not on file    Attends religious service: Not on file    Active member of club or organization: Not on file    Attends meetings of clubs or organizations: Not on file    Relationship status: Not on file  . Intimate partner violence:    Fear of current or ex partner: Not on file    Emotionally abused: Not on file  Physically abused: Not on file    Forced sexual activity: Not on file  Other Topics Concern  . Not on file  Social History Narrative  . Not on file     Family History:   Family History  Problem Relation Age of Onset  . Throat cancer Mother   . Cancer Father   . CAD Sister   . Hypertension Sister   . Diabetes Brother     ROS:  Review of Systems  Constitutional: Positive for malaise/fatigue. Negative for chills, diaphoresis, fever and weight loss.  HENT: Negative for congestion.   Eyes: Negative for discharge and redness.  Respiratory: Negative for cough, hemoptysis, sputum production, shortness of breath and wheezing.   Cardiovascular: Negative for chest pain, palpitations, orthopnea, claudication, leg swelling and PND.  Gastrointestinal: Negative for abdominal pain, blood in stool, heartburn, melena, nausea and vomiting.  Genitourinary: Negative for hematuria.  Musculoskeletal: Negative for falls and myalgias.  Skin: Negative for rash.  Neurological: Positive for dizziness and weakness. Negative for tingling, tremors, sensory change, speech change, focal weakness, loss of consciousness and headaches.  Endo/Heme/Allergies: Does not bruise/bleed easily.  Psychiatric/Behavioral: Positive for substance  abuse. The patient is not nervous/anxious.   All other systems reviewed and are negative.     Physical Exam/Data:   Vitals:   10/29/17 1647 10/29/17 2040 10/30/17 0501 10/30/17 0733  BP: (!) 158/98 (!) 148/89 (!) 145/78 115/81  Pulse: 76 67 62 (!) 48  Resp:  16 16   Temp: 97.7 F (36.5 C) 97.9 F (36.6 C) 97.6 F (36.4 C) 98 F (36.7 C)  TempSrc: Oral Oral Oral Oral  SpO2: 97% 95% 98% 99%  Weight: 65.5 kg     Height: 5\' 8"  (1.727 m)       Intake/Output Summary (Last 24 hours) at 10/30/2017 0819 Last data filed at 10/30/2017 0732 Gross per 24 hour  Intake 1000 ml  Output 775 ml  Net 225 ml   Filed Weights   10/29/17 1340 10/29/17 1647  Weight: 65.9 kg 65.5 kg   Body mass index is 21.94 kg/m.   Physical Exam: General: Well developed, well nourished, in no acute distress. Head: Normocephalic, atraumatic, sclera non-icteric, no xanthomas, nares without discharge. HOH. Neck: Negative for carotid bruits. JVD not elevated. Lungs: Clear bilaterally to auscultation without wheezes, rales, or rhonchi. Breathing is unlabored. Heart: RRR with S1 S2. II/VI systolic murmur at the RUSB, I/VI systolic murmur at the apex, no rubs, or gallops appreciated. Abdomen: Soft, non-tender, non-distended with normoactive bowel sounds. No hepatomegaly. No rebound/guarding. No obvious abdominal masses. Msk:  Strength and tone appear normal for age. Extremities: No clubbing or cyanosis. No edema. Distal pedal pulses are 2+ and equal bilaterally. Neuro: Alert and oriented X 3. No facial asymmetry. No focal deficit. Moves all extremities spontaneously. Psych:  Responds to questions appropriately with a normal affect.   EKG:  The EKG was personally reviewed and demonstrates: NSR with sinus arrhythmia, 92 bpm, LVH, diffuse TWI (slightly changed from prior) Telemetry:  Telemetry was personally reviewed and demonstrates: sinus bradycardia to sinus rhythm with PACs  Weights: Filed Weights   10/29/17  1340 10/29/17 1647  Weight: 65.9 kg 65.5 kg    Relevant CV Studies:  Myoview 11/2014: Abnormal myocardial perfusion study - There is a moderate in size, subtle in severity, completely reversible  defect involving the mid anterolateral, mid anterior, basal anterolateral  and basal anterior segments. This is consistent with probable mild  ischemia. - Global systolic function  is normal. The ejection fraction calculated at  65%.  - Coronary calcifications were noted on the attenuation CT  Myoview 04/2014: No significant ischemia or infarction, normal wall motion, EF 65-70%, no EKG changes concerning for ischemia, low risk scan.  Laboratory Data:  Chemistry Recent Labs  Lab 10/29/17 1336  NA 140  K 3.6  CL 106  CO2 23  GLUCOSE 101*  BUN 15  CREATININE 1.09  CALCIUM 8.9  GFRNONAA >60  GFRAA >60  ANIONGAP 11    Recent Labs  Lab 10/29/17 1336  PROT 6.5  ALBUMIN 4.1  AST 32  ALT 23  ALKPHOS 69  BILITOT 0.8   Hematology Recent Labs  Lab 10/29/17 1336  WBC 9.4  RBC 4.61  HGB 15.1  HCT 43.5  MCV 94.5  MCH 32.8  MCHC 34.7  RDW 13.6  PLT 204   Cardiac Enzymes Recent Labs  Lab 10/29/17 1336 10/29/17 1715 10/29/17 2046 10/30/17 0152  TROPONINI 0.10* 0.24* 0.30* 0.24*   No results for input(s): TROPIPOC in the last 168 hours.  BNPNo results for input(s): BNP, PROBNP in the last 168 hours.  DDimer No results for input(s): DDIMER in the last 168 hours.  Radiology/Studies:  Ct Head Wo Contrast  Result Date: 10/29/2017 IMPRESSION: 1. No CT evidence for acute intracranial abnormality. 2. Atrophy and small vessel ischemic changes of the white matter. Chronic left cerebellar infarct Electronically Signed   By: Donavan Foil M.D.   On: 10/29/2017 14:17   Dg Chest Portable 1 View  Result Date: 10/29/2017 IMPRESSION: No edema or consolidation.  Stable cardiac silhouette. Electronically Signed   By: Lowella Grip III M.D.   On: 10/29/2017 13:53    Assessment  and Plan:   1. Elevated troponin: -Never with chest pain -Prior Myoview in 11/2014 abnormal with patient refusal of LHC at that time -Initially recommended LHC this morning given his abnromal EKG, elevated troponin, and history of abnormal Myoview. However, the patient refuses LHC at this time -He will agree to a Myoview -He is aware of the risks with this decision  -ASA -Echo  2. Dizziness/weakness: -Reports, "I feel great" -Monitor on telemetry for significant arrhythmias or pauses -Echo pending -Consider carotid ultrasound, defer to IM  3. Aortic stenosis: -Check echo  4. Diastolic dysfunction: -He does not appear volume up at this time -Check echo  5. Ascending thoracic aortic aneurysm: -Check echo -Outpatient follow up  6. Alcohol abuse: -CIWA protocol per IM -Cessation advised   7. Tobacco abuse: -Complete cessation advised  8. History of marijuana abuse: -Check urine drug screen  9. Homelessness: -Recommend case manager consult  10. HTN: -BP well controlled currently -Continue amlodipine  -Not on beta blocker given underlying bradycardic rates  11. HLD: -Crestor  -Check lipid panel  12. Prior stroke: -ASA -Crestor   For questions or updates, please contact [place] Please consult [email or web address] for contact info under Cardiology/STEMI.   Signed, Christell Faith, PA-C [place] Pager: [phone] 10/30/2017, 8:19 AM

## 2017-10-30 NOTE — Clinical Social Work Note (Signed)
CSW was informed that patient had some homeless issues.  Bedside nurse informed CSW that patient is staying at a hotel, and plans to return.  The hotel is [place], [place] provided a cab voucher for patient to get a ride back to the hotel he is staying in.  CSW to sign off please reconsult if social work needs arise.  Jones Broom. [place], MSW, [place]  10/30/2017 2:01 PM

## 2017-11-03 LAB — CULTURE, BLOOD (ROUTINE X 2)
CULTURE: NO GROWTH
CULTURE: NO GROWTH
SPECIAL REQUESTS: ADEQUATE

## 2017-11-20 ENCOUNTER — Emergency Department
Admission: EM | Admit: 2017-11-20 | Discharge: 2017-11-20 | Disposition: A | Discharge status: Home / Self Care | Payer: Medicare Other | Attending: Student in an Organized Health Care Education/Training Program | Admitting: Student in an Organized Health Care Education/Training Program

## 2017-11-20 ENCOUNTER — Emergency Department: Payer: Medicare Other

## 2017-11-20 ENCOUNTER — Other Ambulatory Visit: Payer: Self-pay

## 2017-11-20 DIAGNOSIS — F17210 Nicotine dependence, cigarettes, uncomplicated: Secondary | ICD-10-CM | POA: Diagnosis not present

## 2017-11-20 DIAGNOSIS — I10 Essential (primary) hypertension: Secondary | ICD-10-CM | POA: Insufficient documentation

## 2017-11-20 DIAGNOSIS — Z8673 Personal history of transient ischemic attack (TIA), and cerebral infarction without residual deficits: Secondary | ICD-10-CM | POA: Diagnosis not present

## 2017-11-20 DIAGNOSIS — Z7982 Long term (current) use of aspirin: Secondary | ICD-10-CM | POA: Diagnosis not present

## 2017-11-20 DIAGNOSIS — F10920 Alcohol use, unspecified with intoxication, uncomplicated: Secondary | ICD-10-CM

## 2017-11-20 DIAGNOSIS — F319 Bipolar disorder, unspecified: Secondary | ICD-10-CM | POA: Insufficient documentation

## 2017-11-20 DIAGNOSIS — F10120 Alcohol abuse with intoxication, uncomplicated: Secondary | ICD-10-CM | POA: Diagnosis not present

## 2017-11-20 DIAGNOSIS — Z79899 Other long term (current) drug therapy: Secondary | ICD-10-CM | POA: Diagnosis not present

## 2017-11-20 LAB — URINE DRUG SCREEN, QUALITATIVE (ARMC ONLY)
AMPHETAMINES, UR SCREEN: NOT DETECTED
Barbiturates, Ur Screen: NOT DETECTED
CANNABINOID 50 NG, UR ~~LOC~~: NOT DETECTED
Cocaine Metabolite,Ur ~~LOC~~: NOT DETECTED
MDMA (Ecstasy)Ur Screen: NOT DETECTED
Methadone Scn, Ur: NOT DETECTED
OPIATE, UR SCREEN: NOT DETECTED
PHENCYCLIDINE (PCP) UR S: NOT DETECTED
Tricyclic, Ur Screen: NOT DETECTED

## 2017-11-20 LAB — COMPREHENSIVE METABOLIC PANEL
ALK PHOS: 62 U/L (ref 38–126)
ALT: 27 U/L (ref 0–44)
AST: 53 U/L — ABNORMAL HIGH (ref 15–41)
Albumin: 4.1 g/dL (ref 3.5–5.0)
Anion gap: 13 (ref 5–15)
BUN: 10 mg/dL (ref 8–23)
CALCIUM: 8.7 mg/dL — AB (ref 8.9–10.3)
CHLORIDE: 101 mmol/L (ref 98–111)
CO2: 25 mmol/L (ref 22–32)
CREATININE: 0.77 mg/dL (ref 0.61–1.24)
GFR calc non Af Amer: >60 mL/min (ref >60)
Glucose, Bld: 84 mg/dL (ref 70–99)
Potassium: 3.8 mmol/L (ref 3.5–5.1)
SODIUM: 139 mmol/L (ref 135–145)
Total Bilirubin: 0.9 mg/dL (ref 0.3–1.2)
Total Protein: 6.7 g/dL (ref 6.5–8.1)

## 2017-11-20 LAB — CBC
HEMATOCRIT: 41.1 % (ref 40.0–52.0)
Hemoglobin: 14.2 g/dL (ref 13.0–18.0)
MCH: 33 pg (ref 26.0–34.0)
MCHC: 34.6 g/dL (ref 32.0–36.0)
MCV: 95.3 fL (ref 80.0–100.0)
PLATELETS: 142 10*3/uL — AB (ref 150–440)
RBC: 4.31 MIL/uL — ABNORMAL LOW (ref 4.40–5.90)
RDW: 14.2 % (ref 11.5–14.5)
WBC: 6.4 10*3/uL (ref 3.8–10.6)

## 2017-11-20 LAB — ETHANOL: Alcohol, Ethyl (B): 279 mg/dL — ABNORMAL HIGH (ref <10)

## 2017-11-20 MED ORDER — VITAMIN B-1 100 MG PO TABS
100.0000 mg | ORAL_TABLET | Freq: Once | ORAL | Status: AC
  Administered 2017-11-20: 100 mg via ORAL
  Filled 2017-11-20: qty 1

## 2017-11-20 NOTE — BH Assessment (Signed)
Information sent to [facility] and confirmed it was received ([phone]).  Patient was declined due to medical [employer].

## 2017-11-20 NOTE — BH Assessment (Addendum)
Per the request of the ER MD (Dr. Quentin Cornwall), writer spoke with the patient about his options for detox and SA Treatment. Patient signed a "Release of Information" for [facility] to send notes and labs for a referral.   Patient reports of having a history of alcohol use. He drinks on a daily basis in amount of three to four forty ounces. He also states he was evicted from his home due to his alcohol use. He denies history of violence and aggression. He also Patient denies SI/HI and AV/H.  Patient reports he receives SSI and he have money in the bank that he was planning to use to move into another place.

## 2017-11-20 NOTE — ED Notes (Signed)
Pt ambulatory to bathroom without difficulty.  

## 2017-11-20 NOTE — ED Notes (Signed)
EDP to bedside to provide patient and family with update. 

## 2017-11-20 NOTE — ED Triage Notes (Signed)
To ER via [facility] from [facility] where patient was found by EMS and PD. Pt states he is an alcoholic. States 4 24oz beer today. Denies drug use, denies cigarette use. Pt appears intoxication

## 2017-11-20 NOTE — ED Provider Notes (Signed)
Patient has been declined from [facility] and [facility] inpatient detox.  At this point he is sober and has been ambulating about the ER no acute distress.  He is here with family and has a safe disposition home.  He denies any SI or HI.  At this point do believe he stable and appropriate for discharge home.   Merlyn Lot, MD 11/20/17 2006

## 2017-11-20 NOTE — ED Provider Notes (Signed)
[facility] Emergency Department Provider Note    First MD Initiated Contact with Patient 11/20/17 1554     (approximate)  I have reviewed the triage vital signs and the nursing notes.   HISTORY  Chief Complaint detox  Level V Caveat:  Acute encephalopathy - Etoh intoxication  HPI Adam Good is a 72 y.o. male   extensive history of alcohol abuse presents the ER via [facility] police department after being found intoxicated downtown.  Patient was given the option to go to jail or come to the ER for detox.  Patient elected to come to the ER for detox.  Patient is heavily intoxicated very poor historian.  Denies any pain.  No evidence of trauma.   Past Medical History:  Diagnosis Date  . Alcohol abuse   . Aortic stenosis   . Bipolar disorder ([place])   . Demand ischemia of myocardium ([place]) 01/2014   a. positive troponins in the setting of a fall during intoxication; b. Myoview 2/16: EF 65-70%, no evi of ischemia or infarct. No RWMA.; c. MV 9/16: mild ischemia in the mid anterolateral, mid anterior, basal anterolateral and basal anterior segments, patietn declined LHC  . Diastolic dysfunction    a. TTE 9/16: EF of 50-55%, Gr2DD, severely dilated LA, and moderate AS; b. TTE 3/19: EF of 50-55%, Gr1DD, mildly dilated left atrium, mildly calcified mitral leaflets, mild AS, mild TR, trace PR, RVSP 46 mmHg, trivial pericardial effusion  . Dyslipidemia   . Essential hypertension   . Gastroesophageal reflux disease   . HLD (hyperlipidemia)   . Sleep apnea   . Stroke [facility])    Family History  Problem Relation Age of Onset  . Throat cancer Mother   . Cancer Father   . CAD Sister   . Hypertension Sister   . Diabetes Brother    Past Surgical History:  Procedure Laterality Date  . HERNIA REPAIR    . KNEE SURGERY    . NM MYOVIEW LTD  Jan 2009; 05/12/14   a) No ischemia/infarct. Normal EF - 73%; b) EF 65-70%, no evidence of ischemia or infarction. No RWMA.    Patient Active Problem List   Diagnosis Date Noted  . Elevated troponin 10/29/2017  . Dyslipidemia   . Essential hypertension   . Alcohol abuse   . Heart murmur   . Demand ischemia of myocardium ([place]) 01/17/2014      Prior to Admission medications   Medication Sig Start Date End Date Taking? Authorizing Provider  amLODipine (NORVASC) 5 MG tablet Take 5 mg by mouth daily.    [provider]  aspirin 81 MG chewable tablet Chew 81 mg by mouth daily.    [provider]  carvedilol (COREG) 3.125 MG tablet Take 3.125 mg by mouth 2 (two) times daily.    [provider]  lisinopril (PRINIVIL,ZESTRIL) 20 MG tablet Take 20 mg by mouth daily.    [provider]  rosuvastatin (CRESTOR) 20 MG tablet Take 20 mg by mouth at bedtime.    [provider]    Allergies Patient has no known allergies.    Social History Social History   Tobacco Use  . Smoking status: Current Every Day Smoker    Packs/day: 0.50    Years: 57.00    Pack years: 28.50    Types: Cigarettes  . Smokeless tobacco: Current User    Types: Snuff  Substance Use Topics  . Alcohol use: Yes    Alcohol/week: 0.0  standard drinks    Comment: He has been a heavy alcohol drinker for > 30 yrs.  Stats that he quit 3 weeks ago.   . Drug use: No    Review of Systems Patient denies headaches, rhinorrhea, blurry vision, numbness, shortness of breath, chest pain, edema, cough, abdominal pain, nausea, vomiting, diarrhea, dysuria, fevers, rashes or hallucinations unless otherwise stated above in HPI. ____________________________________________   PHYSICAL EXAM:  VITAL SIGNS: Vitals:   11/20/17 1416  BP: (!) 170/104  Pulse: 86  Resp: 14  Temp: 97.7 F (36.5 C)  SpO2: 98%    Constitutional: disheveled, intoxicated but able to ambulate Eyes: Conjunctivae are normal.  Head: Atraumatic. Nose: No congestion/rhinnorhea. Mouth/Throat: Mucous membranes are moist.   Neck: No  stridor. Painless ROM.  Cardiovascular: Normal rate, regular rhythm. Grossly normal heart sounds.  Good peripheral circulation. Respiratory: Normal respiratory effort.  No retractions. Lungs CTAB. Gastrointestinal: Soft and nontender. No distention. No abdominal bruits. No CVA tenderness. Genitourinary:  Musculoskeletal: No lower extremity tenderness nor edema.  No joint effusions. Neurologic:  Dysarthric speech. No gross focal neurologic deficits are appreciated. No facial droop. No fasciculations Skin:  Skin is warm, dry and intact. No rash noted. Psychiatric: acutely intoxicated, denies SI or HI  ____________________________________________   LABS (all labs ordered are listed, but only abnormal results are displayed)  Results for orders placed or performed during the hospital encounter of 11/20/17 (from the past 24 hour(s))  Comprehensive metabolic panel     Status: Abnormal   Collection Time: 11/20/17  2:20 PM  Result Value Ref Range   Sodium 139 135 - 145 mmol/L   Potassium 3.8 3.5 - 5.1 mmol/L   Chloride 101 98 - 111 mmol/L   CO2 25 22 - 32 mmol/L   Glucose, Bld 84 70 - 99 mg/dL   BUN 10 8 - 23 mg/dL   Creatinine, Ser 0.77 0.61 - 1.24 mg/dL   Calcium 8.7 (L) 8.9 - 10.3 mg/dL   Total Protein 6.7 6.5 - 8.1 g/dL   Albumin 4.1 3.5 - 5.0 g/dL   AST 53 (H) 15 - 41 U/L   ALT 27 0 - 44 U/L   Alkaline Phosphatase 62 38 - 126 U/L   Total Bilirubin 0.9 0.3 - 1.2 mg/dL   GFR calc non Af Amer >60 >60 mL/min   GFR calc Af Amer >60 >60 mL/min   Anion gap 13 5 - 15  Ethanol     Status: Abnormal   Collection Time: 11/20/17  2:20 PM  Result Value Ref Range   Alcohol, Ethyl (B) 279 (H) <10 mg/dL  cbc     Status: Abnormal   Collection Time: 11/20/17  2:20 PM  Result Value Ref Range   WBC 6.4 3.8 - 10.6 K/uL   RBC 4.31 (L) 4.40 - 5.90 MIL/uL   Hemoglobin 14.2 13.0 - 18.0 g/dL   HCT 41.1 40.0 - 52.0 %   MCV 95.3 80.0 - 100.0 fL   MCH 33.0 26.0 - 34.0 pg   MCHC 34.6 32.0 - 36.0 g/dL     RDW 14.2 11.5 - 14.5 %   Platelets 142 (L) 150 - 440 K/uL   ____________________________________________  EKG My review and personal interpretation at Time: 16:44   Indication: intoxication  Rate: 65  Rhythm: sinus Axis: normal Other: normal intervals, nonsepcific t wave abn, no stemi ____________________________________________  RADIOLOGY  I personally reviewed all radiographic images ordered to evaluate for the above acute complaints and reviewed radiology reports  and findings.  These findings were personally discussed with the patient.  Please see medical record for radiology report.  ____________________________________________   PROCEDURES  Procedure(s) performed:  Procedures    Critical Care performed: no ____________________________________________   INITIAL IMPRESSION / ASSESSMENT AND PLAN / ED COURSE  Pertinent labs & imaging results that were available during my care of the patient were reviewed by me and considered in my medical decision making (see chart for details).   DDX: etoh dependence, intoxication, sdh, iph, unlikely cva, electrolyte abn  SELMER ADDUCI is a 72 y.o. who presents to the ED with symptoms as described above.  Patient is clinically intoxicated.  Patient is AFVSS in ED. Exam as above. Given current presentation have considered the above differential.  Given his high risk comorbidities and significant alcohol dependence.  Blood work as well as EKG and CT imaging of the head will be ordered this is a very unreliable historian with unreliable exam at this time.  Clinical Course as of Nov 21 1747  Wed Nov 20, 2017  1748 CT imaging is reassuring.  Does not have any signs of traumatic injury.  Neuro exam though he is mildly intoxicated gated he is clearing and appears to be appropriately sobering up at this point.  I do believe he is appropriate and medically cleared for detox placement.   [PR]    Clinical Course User Index [PR] Merlyn Lot, MD     As part of my medical decision making, I reviewed the following data within the [facility] notes reviewed and incorporated, Labs reviewed, notes from prior ED visits.   ____________________________________________   FINAL CLINICAL IMPRESSION(S) / ED DIAGNOSES  Final diagnoses:  Alcoholic intoxication without complication ([place])      NEW MEDICATIONS STARTED DURING THIS VISIT:  New Prescriptions   No medications on file     Note:  This document was prepared using Dragon voice recognition software and may include unintentional dictation errors.    Merlyn Lot, MD 11/20/17 [phone]

## 2017-11-20 NOTE — ED Notes (Signed)
TTS at bedside. 

## 2018-03-24 ENCOUNTER — Emergency Department: Payer: Medicare Other

## 2018-03-24 ENCOUNTER — Other Ambulatory Visit: Payer: Self-pay

## 2018-03-24 ENCOUNTER — Inpatient Hospital Stay
Admission: EM | Admit: 2018-03-24 | Discharge: 2018-03-26 | DRG: 311 | Disposition: A | Discharge status: Home / Self Care | Payer: Medicare Other | Attending: Internal Medicine | Admitting: Internal Medicine

## 2018-03-24 ENCOUNTER — Encounter: Payer: Self-pay | Admitting: Emergency Medicine

## 2018-03-24 DIAGNOSIS — Z7982 Long term (current) use of aspirin: Secondary | ICD-10-CM

## 2018-03-24 DIAGNOSIS — F10929 Alcohol use, unspecified with intoxication, unspecified: Secondary | ICD-10-CM

## 2018-03-24 DIAGNOSIS — E785 Hyperlipidemia, unspecified: Secondary | ICD-10-CM | POA: Diagnosis present

## 2018-03-24 DIAGNOSIS — Z833 Family history of diabetes mellitus: Secondary | ICD-10-CM

## 2018-03-24 DIAGNOSIS — E878 Other disorders of electrolyte and fluid balance, not elsewhere classified: Secondary | ICD-10-CM | POA: Diagnosis present

## 2018-03-24 DIAGNOSIS — I110 Hypertensive heart disease with heart failure: Secondary | ICD-10-CM | POA: Diagnosis present

## 2018-03-24 DIAGNOSIS — I248 Other forms of acute ischemic heart disease: Secondary | ICD-10-CM | POA: Diagnosis not present

## 2018-03-24 DIAGNOSIS — Z8249 Family history of ischemic heart disease and other diseases of the circulatory system: Secondary | ICD-10-CM

## 2018-03-24 DIAGNOSIS — Z23 Encounter for immunization: Secondary | ICD-10-CM

## 2018-03-24 DIAGNOSIS — Z8673 Personal history of transient ischemic attack (TIA), and cerebral infarction without residual deficits: Secondary | ICD-10-CM

## 2018-03-24 DIAGNOSIS — I5032 Chronic diastolic (congestive) heart failure: Secondary | ICD-10-CM | POA: Diagnosis present

## 2018-03-24 DIAGNOSIS — R740 Nonspecific elevation of levels of transaminase and lactic acid dehydrogenase [LDH]: Secondary | ICD-10-CM | POA: Diagnosis present

## 2018-03-24 DIAGNOSIS — Z808 Family history of malignant neoplasm of other organs or systems: Secondary | ICD-10-CM

## 2018-03-24 DIAGNOSIS — I10 Essential (primary) hypertension: Secondary | ICD-10-CM | POA: Diagnosis present

## 2018-03-24 DIAGNOSIS — R778 Other specified abnormalities of plasma proteins: Secondary | ICD-10-CM

## 2018-03-24 DIAGNOSIS — R7989 Other specified abnormal findings of blood chemistry: Secondary | ICD-10-CM

## 2018-03-24 DIAGNOSIS — I350 Nonrheumatic aortic (valve) stenosis: Secondary | ICD-10-CM | POA: Diagnosis present

## 2018-03-24 DIAGNOSIS — F10120 Alcohol abuse with intoxication, uncomplicated: Secondary | ICD-10-CM | POA: Diagnosis present

## 2018-03-24 DIAGNOSIS — F17210 Nicotine dependence, cigarettes, uncomplicated: Secondary | ICD-10-CM | POA: Diagnosis present

## 2018-03-24 DIAGNOSIS — F10229 Alcohol dependence with intoxication, unspecified: Secondary | ICD-10-CM | POA: Diagnosis present

## 2018-03-24 DIAGNOSIS — Z915 Personal history of self-harm: Secondary | ICD-10-CM

## 2018-03-24 DIAGNOSIS — E871 Hypo-osmolality and hyponatremia: Secondary | ICD-10-CM | POA: Diagnosis present

## 2018-03-24 LAB — COMPREHENSIVE METABOLIC PANEL
ALBUMIN: 4.2 g/dL (ref 3.5–5.0)
ALK PHOS: 88 U/L (ref 38–126)
ALT: 24 U/L (ref 0–44)
AST: 56 U/L — ABNORMAL HIGH (ref 15–41)
Anion gap: 15 (ref 5–15)
BUN: 12 mg/dL (ref 8–23)
CO2: 25 mmol/L (ref 22–32)
Calcium: 8.4 mg/dL — ABNORMAL LOW (ref 8.9–10.3)
Chloride: 94 mmol/L — ABNORMAL LOW (ref 98–111)
Creatinine, Ser: 0.87 mg/dL (ref 0.61–1.24)
GFR calc non Af Amer: >60 mL/min (ref >60)
Glucose, Bld: 88 mg/dL (ref 70–99)
Potassium: 3.9 mmol/L (ref 3.5–5.1)
SODIUM: 134 mmol/L — AB (ref 135–145)
TOTAL PROTEIN: 6.9 g/dL (ref 6.5–8.1)
Total Bilirubin: 1.2 mg/dL (ref 0.3–1.2)

## 2018-03-24 LAB — CBC
HCT: 43.1 % (ref 39.0–52.0)
Hemoglobin: 14.4 g/dL (ref 13.0–17.0)
MCH: 31.2 pg (ref 26.0–34.0)
MCHC: 33.4 g/dL (ref 30.0–36.0)
MCV: 93.3 fL (ref 80.0–100.0)
Platelets: 181 10*3/uL (ref 150–400)
RBC: 4.62 MIL/uL (ref 4.22–5.81)
RDW: 12 % (ref 11.5–15.5)
WBC: 10 10*3/uL (ref 4.0–10.5)
nRBC: 0 % (ref 0.0–0.2)

## 2018-03-24 LAB — URINE DRUG SCREEN, QUALITATIVE (ARMC ONLY)
AMPHETAMINES, UR SCREEN: NOT DETECTED
Barbiturates, Ur Screen: NOT DETECTED
Benzodiazepine, Ur Scrn: NOT DETECTED
Cannabinoid 50 Ng, Ur ~~LOC~~: NOT DETECTED
Cocaine Metabolite,Ur ~~LOC~~: NOT DETECTED
MDMA (Ecstasy)Ur Screen: NOT DETECTED
Methadone Scn, Ur: NOT DETECTED
Opiate, Ur Screen: NOT DETECTED
Phencyclidine (PCP) Ur S: NOT DETECTED
Tricyclic, Ur Screen: NOT DETECTED

## 2018-03-24 LAB — TROPONIN I: Troponin I: 0.11 ng/mL (ref <0.03)

## 2018-03-24 MED ORDER — THIAMINE HCL 100 MG/ML IJ SOLN
100.0000 mg | Freq: Once | INTRAMUSCULAR | Status: AC
  Administered 2018-03-24: 100 mg via INTRAVENOUS
  Filled 2018-03-24: qty 2

## 2018-03-24 MED ORDER — THIAMINE HCL 100 MG/ML IJ SOLN
Freq: Once | INTRAVENOUS | Status: AC
  Administered 2018-03-24: 20:00:00 via INTRAVENOUS
  Filled 2018-03-24: qty 1000

## 2018-03-24 NOTE — ED Notes (Addendum)
Date and time results received: 03/24/18 1804  Test: ethanol Critical Value: 332  Name of Provider Notified: dr. Cinda Quest  Orders Received? Or Actions Taken?: no new orders at this time

## 2018-03-24 NOTE — ED Triage Notes (Signed)
Patient was found staggering around PD and PD called EMS to come get patient. Patient seems to be belligerent.

## 2018-03-24 NOTE — ED Notes (Signed)
Date and time results received: 03/24/18 6:31 PM  Test: troponin I Critical Value: 0.10  Name of Provider Notified: dr. Cinda Quest  Orders Received? Or Actions Taken?: no new orders at this time

## 2018-03-24 NOTE — ED Provider Notes (Signed)
[facility] Emergency Department Provider Note   ____________________________________________   First MD Initiated Contact with Patient 03/24/18 1753     (approximate)  I have reviewed the triage vital signs and the nursing notes.   HISTORY  Chief Complaint Alcohol Intoxication  Patient appears to be intoxicated.  I can hardly get any history out of him at all.  HPI Adam Good is a 73 y.o. male patient staggering around the police department EMS was called to get him.  He was felt to be intoxicated brought to the ER.Marland Kitchen  Patient speech here is almost unintelligible.  He mumbles something about having had a light heart attack at some point.  I understand this was before but I am not sure.   Past Medical History:  Diagnosis Date  . Alcohol abuse   . Aortic stenosis   . Bipolar disorder ([place])   . Demand ischemia of myocardium ([place]) 01/2014   a. positive troponins in the setting of a fall during intoxication; b. Myoview 2/16: EF 65-70%, no evi of ischemia or infarct. No RWMA.; c. MV 9/16: mild ischemia in the mid anterolateral, mid anterior, basal anterolateral and basal anterior segments, patietn declined LHC  . Diastolic dysfunction    a. TTE 9/16: EF of 50-55%, Gr2DD, severely dilated LA, and moderate AS; b. TTE 3/19: EF of 50-55%, Gr1DD, mildly dilated left atrium, mildly calcified mitral leaflets, mild AS, mild TR, trace PR, RVSP 46 mmHg, trivial pericardial effusion  . Dyslipidemia   . Essential hypertension   . Gastroesophageal reflux disease   . HLD (hyperlipidemia)   . Sleep apnea   . Stroke [facility])     Patient Active Problem List   Diagnosis Date Noted  . Elevated troponin 10/29/2017  . Dyslipidemia   . Essential hypertension   . Alcohol abuse   . Heart murmur   . Demand ischemia of myocardium ([place]) 01/17/2014    Past Surgical History:  Procedure Laterality Date  . HERNIA REPAIR    . KNEE SURGERY    . NM MYOVIEW LTD  Jan 2009;  05/12/14   a) No ischemia/infarct. Normal EF - 73%; b) EF 65-70%, no evidence of ischemia or infarction. No RWMA.    Prior to Admission medications   Medication Sig Start Date End Date Taking? Authorizing Provider  amLODipine (NORVASC) 5 MG tablet Take 5 mg by mouth daily.    [provider]  aspirin 81 MG chewable tablet Chew 81 mg by mouth daily.    [provider]  carvedilol (COREG) 3.125 MG tablet Take 3.125 mg by mouth 2 (two) times daily.    [provider]  lisinopril (PRINIVIL,ZESTRIL) 20 MG tablet Take 20 mg by mouth daily.    [provider]  rosuvastatin (CRESTOR) 20 MG tablet Take 20 mg by mouth at bedtime.    [provider]    Allergies Patient has no known allergies.  Family History  Problem Relation Age of Onset  . Throat cancer Mother   . Cancer Father   . CAD Sister   . Hypertension Sister   . Diabetes Brother     Social History Social History   Tobacco Use  . Smoking status: Current Every Day Smoker    Packs/day: 0.50    Years: 57.00    Pack years: 28.50    Types: Cigarettes  . Smokeless tobacco: Current User    Types: Snuff  Substance Use Topics  . Alcohol use: Yes  Alcohol/week: 0.0 standard drinks    Comment: He has been a heavy alcohol drinker for > 30 yrs.  Stats that he quit 3 weeks ago.   . Drug use: No    Review of Systems Unable to obtain ____________________________________________   PHYSICAL EXAM:  VITAL SIGNS: ED Triage Vitals  Enc Vitals Group     BP 03/24/18 1710 (!) 141/100     Pulse Rate 03/24/18 1710 82     Resp 03/24/18 1710 15     Temp 03/24/18 1710 97.6 F (36.4 C)     Temp Source 03/24/18 1710 Axillary     SpO2 03/24/18 1710 98 %     Weight 03/24/18 1713 150 lb (68 kg)     Height 03/24/18 1713 5\' 6"  (1.676 m)     Head Circumference --      Peak Flow --      Pain Score 03/24/18 1713 0     Pain Loc --      Pain Edu? --      Excl. in [place]? --     Constitutional:  Alert moving all extremities very slurry speech not making sense when he does talk Eyes: Conjunctivae are normal. PER. EOMI. Head: Atraumatic. Nose: No congestion/rhinnorhea. Mouth/Throat: Mucous membranes are moist.  Oropharynx non-erythematous. Neck: No stridor.   Cardiovascular: Normal rate, regular rhythm. Grossly normal heart sounds.  Good peripheral circulation. Respiratory: Normal respiratory effort.  No retractions. Lungs CTAB. Gastrointestinal: Soft and nontender. No distention. No abdominal bruits. No CVA tenderness. Musculoskeletal: No lower extremity tenderness nor edema.   Neurologic:  Normal speech and language. No gross focal neurologic deficits are appreciated. No gait instability. Skin:  Skin is warm, dry and intact.  Psychiatric: Mood and affect are normal. Speech and behavior are normal.  ____________________________________________   LABS (all labs ordered are listed, but only abnormal results are displayed)  Labs Reviewed  COMPREHENSIVE METABOLIC PANEL - Abnormal; Notable for the following components:      Result Value   Sodium 134 (*)    Chloride 94 (*)    Calcium 8.4 (*)    AST 56 (*)    All other components within normal limits  ETHANOL - Abnormal; Notable for the following components:   Alcohol, Ethyl (B) 332 (*)    All other components within normal limits  TROPONIN I - Abnormal; Notable for the following components:   Troponin I 0.10 (*)    All other components within normal limits  TROPONIN I - Abnormal; Notable for the following components:   Troponin I 0.11 (*)    All other components within normal limits  CBC  URINE DRUG SCREEN, QUALITATIVE ([facility] ONLY)   ____________________________________________  EKG  EKG read and interpreted by me shows normal sinus rhythm rate of 73 normal axis very poor baseline due to patient's intermittent shaking.  It looks similar to EKG from last September.  No obvious acute  changes ____________________________________________  RADIOLOGY  ED MD interpretation:   Official radiology report(s): Dg Chest 2 View  Result Date: 03/24/2018 CLINICAL DATA:  Altered mental status EXAM: CHEST - 2 VIEW COMPARISON:  10/29/2017 FINDINGS: No acute consolidation or effusion. Cardiomediastinal silhouette within normal limits. No pneumothorax. Old bilateral rib fractures. Small hiatal hernia. IMPRESSION: No active cardiopulmonary disease.  Hiatal hernia Electronically Signed   By: Donavan Foil M.D.   On: 03/24/2018 18:24   Ct Head Wo Contrast  Result Date: 03/24/2018 CLINICAL DATA:  Altered LOC EXAM: CT HEAD WITHOUT CONTRAST TECHNIQUE:  Contiguous axial images were obtained from the base of the skull through the vertex without intravenous contrast. COMPARISON:  11/20/2017 CT FINDINGS: Brain: No acute territorial infarction, hemorrhage or intracranial mass. Old left cerebellar infarct. Chronic lacunar infarct left basal ganglia. Atrophy with mild small vessel ischemic changes of the white matter. Stable ventricle size. Vascular: No hyperdense vessel.  Carotid vascular calcification Skull: Normal. Negative for fracture or focal lesion. Sinuses/Orbits: Probable chronic fracture deformity of the nasal bones and left lateral maxillary sinus wall. Other: None IMPRESSION: 1. No CT evidence for acute intracranial abnormality. 2. Atrophy and mild small vessel ischemic changes of the white matter. Old left cerebellar infarct. Electronically Signed   By: Donavan Foil M.D.   On: 03/24/2018 18:22    ____________________________________________   PROCEDURES  Procedure(s) performed:   Procedures  Critical Care performed:   ____________________________________________   INITIAL IMPRESSION / ASSESSMENT AND PLAN / ED COURSE  Patient was in the ER 8/13.  He had an elevated troponin at the time felt to be due to demand ischemia.  He had had a low risk scan and stress test just prior to that.   Patient does not seem to be having any chest pain or tightness and as I mentioned previously EKG does not seem to be particularly different than any prior EKG.  ----------------------------------------- 8:10 PM on 03/24/2018 -----------------------------------------  Patient's troponin is rising.  Patient is too intoxicated for me to get any kind of history out of him.  The safest thing to be to watch him upstairs rule him out and then see about detox.       ____________________________________________   FINAL CLINICAL IMPRESSION(S) / ED DIAGNOSES  Final diagnoses:  Alcoholic intoxication with complication ([place])  Elevated troponin     ED Discharge Orders    None       Note:  This document was prepared using Dragon voice recognition software and may include unintentional dictation errors.    Nena Polio, MD 03/24/18 2010

## 2018-03-24 NOTE — ED Triage Notes (Signed)
Pt via ems from police department. Pt was found staggering around PD, found to be intoxicated, and brought to hospital. Pt alert, unable to coherently answer questions. Pt urinated on himself PTA.

## 2018-03-24 NOTE — ED Notes (Signed)
$  68 taken out of wallet and given to [employer], [employer]. Wallet placed in belongings bag.

## 2018-03-24 NOTE — ED Notes (Signed)
All belongings except wallet in 2 belongings bags.

## 2018-03-24 NOTE — ED Notes (Addendum)
Pt belongings: orange sweatshirt, pack of cigarettes, lighter, bus fare card, black tennis shoes, black and orange socks, jeans, striped underwear, blue t-shirt, 2 cans snuff, wallet with $68, some change (left in jeans pocket)  Bus fare card added to wallet

## 2018-03-25 ENCOUNTER — Other Ambulatory Visit: Payer: Self-pay

## 2018-03-25 ENCOUNTER — Encounter: Payer: Self-pay | Admitting: Internal Medicine

## 2018-03-25 DIAGNOSIS — R7989 Other specified abnormal findings of blood chemistry: Secondary | ICD-10-CM | POA: Diagnosis present

## 2018-03-25 DIAGNOSIS — E785 Hyperlipidemia, unspecified: Secondary | ICD-10-CM | POA: Diagnosis present

## 2018-03-25 DIAGNOSIS — R740 Nonspecific elevation of levels of transaminase and lactic acid dehydrogenase [LDH]: Secondary | ICD-10-CM | POA: Diagnosis present

## 2018-03-25 DIAGNOSIS — F10229 Alcohol dependence with intoxication, unspecified: Secondary | ICD-10-CM | POA: Diagnosis present

## 2018-03-25 DIAGNOSIS — I340 Nonrheumatic mitral (valve) insufficiency: Secondary | ICD-10-CM | POA: Diagnosis not present

## 2018-03-25 DIAGNOSIS — F10120 Alcohol abuse with intoxication, uncomplicated: Secondary | ICD-10-CM | POA: Diagnosis present

## 2018-03-25 DIAGNOSIS — Z833 Family history of diabetes mellitus: Secondary | ICD-10-CM | POA: Diagnosis not present

## 2018-03-25 DIAGNOSIS — E878 Other disorders of electrolyte and fluid balance, not elsewhere classified: Secondary | ICD-10-CM | POA: Diagnosis present

## 2018-03-25 DIAGNOSIS — Z23 Encounter for immunization: Secondary | ICD-10-CM | POA: Diagnosis present

## 2018-03-25 DIAGNOSIS — I350 Nonrheumatic aortic (valve) stenosis: Secondary | ICD-10-CM | POA: Diagnosis present

## 2018-03-25 DIAGNOSIS — E871 Hypo-osmolality and hyponatremia: Secondary | ICD-10-CM | POA: Diagnosis present

## 2018-03-25 DIAGNOSIS — Z8673 Personal history of transient ischemic attack (TIA), and cerebral infarction without residual deficits: Secondary | ICD-10-CM | POA: Diagnosis not present

## 2018-03-25 DIAGNOSIS — F17210 Nicotine dependence, cigarettes, uncomplicated: Secondary | ICD-10-CM | POA: Diagnosis present

## 2018-03-25 DIAGNOSIS — I110 Hypertensive heart disease with heart failure: Secondary | ICD-10-CM | POA: Diagnosis present

## 2018-03-25 DIAGNOSIS — Z8249 Family history of ischemic heart disease and other diseases of the circulatory system: Secondary | ICD-10-CM | POA: Diagnosis not present

## 2018-03-25 DIAGNOSIS — I5032 Chronic diastolic (congestive) heart failure: Secondary | ICD-10-CM | POA: Diagnosis present

## 2018-03-25 DIAGNOSIS — Z7982 Long term (current) use of aspirin: Secondary | ICD-10-CM | POA: Diagnosis not present

## 2018-03-25 DIAGNOSIS — Z915 Personal history of self-harm: Secondary | ICD-10-CM | POA: Diagnosis not present

## 2018-03-25 DIAGNOSIS — I248 Other forms of acute ischemic heart disease: Secondary | ICD-10-CM | POA: Diagnosis not present

## 2018-03-25 DIAGNOSIS — Z808 Family history of malignant neoplasm of other organs or systems: Secondary | ICD-10-CM | POA: Diagnosis not present

## 2018-03-25 DIAGNOSIS — I361 Nonrheumatic tricuspid (valve) insufficiency: Secondary | ICD-10-CM | POA: Diagnosis not present

## 2018-03-25 DIAGNOSIS — I370 Nonrheumatic pulmonary valve stenosis: Secondary | ICD-10-CM | POA: Diagnosis not present

## 2018-03-25 LAB — URINALYSIS, ROUTINE W REFLEX MICROSCOPIC
Bilirubin Urine: NEGATIVE
Glucose, UA: NEGATIVE mg/dL
HGB URINE DIPSTICK: NEGATIVE
Ketones, ur: 5 mg/dL — AB
Leukocytes, UA: NEGATIVE
Nitrite: NEGATIVE
Protein, ur: NEGATIVE mg/dL
Specific Gravity, Urine: 1.004 — ABNORMAL LOW (ref 1.005–1.030)
pH: 5 (ref 5.0–8.0)

## 2018-03-25 LAB — TROPONIN I
TROPONIN I: 0.11 ng/mL — AB (ref <0.03)
Troponin I: 0.1 ng/mL (ref <0.03)
Troponin I: 0.12 ng/mL (ref <0.03)
Troponin I: 0.1 ng/mL (ref <0.03)
Troponin I: 0.1 ng/mL (ref <0.03)

## 2018-03-25 LAB — PHOSPHORUS: Phosphorus: 3.6 mg/dL (ref 2.5–4.6)

## 2018-03-25 LAB — MAGNESIUM: Magnesium: 1.9 mg/dL (ref 1.7–2.4)

## 2018-03-25 LAB — ETHANOL: Alcohol, Ethyl (B): 332 mg/dL (ref <10)

## 2018-03-25 MED ORDER — PNEUMOCOCCAL VAC POLYVALENT 25 MCG/0.5ML IJ INJ
0.5000 mL | INJECTION | INTRAMUSCULAR | Status: AC
  Administered 2018-03-26: 0.5 mL via INTRAMUSCULAR
  Filled 2018-03-25: qty 0.5

## 2018-03-25 MED ORDER — ALBUTEROL SULFATE (2.5 MG/3ML) 0.083% IN NEBU: 2.5000 mg | INHALATION_SOLUTION | Freq: Four times a day (QID) | RESPIRATORY_TRACT | Status: DC | PRN

## 2018-03-25 MED ORDER — LORAZEPAM 2 MG/ML IJ SOLN: 0.0000 mg | Freq: Two times a day (BID) | INTRAMUSCULAR | Status: DC

## 2018-03-25 MED ORDER — AMLODIPINE BESYLATE 5 MG PO TABS
5.0000 mg | ORAL_TABLET | Freq: Every day | ORAL | Status: DC
  Administered 2018-03-25 – 2018-03-26 (×2): 5 mg via ORAL
  Filled 2018-03-25 (×2): qty 1

## 2018-03-25 MED ORDER — ONDANSETRON HCL 4 MG/2ML IJ SOLN: 4.0000 mg | Freq: Four times a day (QID) | INTRAMUSCULAR | Status: DC | PRN

## 2018-03-25 MED ORDER — BISACODYL 5 MG PO TBEC: 5.0000 mg | DELAYED_RELEASE_TABLET | Freq: Every day | ORAL | Status: DC | PRN

## 2018-03-25 MED ORDER — ONDANSETRON HCL 4 MG PO TABS: 4.0000 mg | ORAL_TABLET | Freq: Four times a day (QID) | ORAL | Status: DC | PRN

## 2018-03-25 MED ORDER — VITAMIN B-1 100 MG PO TABS
100.0000 mg | ORAL_TABLET | Freq: Every day | ORAL | Status: DC
  Administered 2018-03-25 – 2018-03-26 (×2): 100 mg via ORAL
  Filled 2018-03-25 (×2): qty 1

## 2018-03-25 MED ORDER — ASPIRIN EC 81 MG PO TBEC
81.0000 mg | DELAYED_RELEASE_TABLET | Freq: Every day | ORAL | Status: DC
  Administered 2018-03-25 – 2018-03-26 (×2): 81 mg via ORAL
  Filled 2018-03-25 (×2): qty 1

## 2018-03-25 MED ORDER — LORAZEPAM 1 MG PO TABS
1.0000 mg | ORAL_TABLET | Freq: Four times a day (QID) | ORAL | Status: DC | PRN
  Administered 2018-03-26: 1 mg via ORAL
  Filled 2018-03-25: qty 1

## 2018-03-25 MED ORDER — ROSUVASTATIN CALCIUM 20 MG PO TABS
20.0000 mg | ORAL_TABLET | Freq: Every day | ORAL | Status: DC
  Administered 2018-03-25: 20 mg via ORAL
  Filled 2018-03-25: qty 1
  Filled 2018-03-25: qty 2
  Filled 2018-03-25: qty 1

## 2018-03-25 MED ORDER — CARVEDILOL 3.125 MG PO TABS
3.1250 mg | ORAL_TABLET | Freq: Two times a day (BID) | ORAL | Status: DC
  Administered 2018-03-25 – 2018-03-26 (×3): 3.125 mg via ORAL
  Filled 2018-03-25 (×3): qty 1

## 2018-03-25 MED ORDER — FOLIC ACID 1 MG PO TABS
1.0000 mg | ORAL_TABLET | Freq: Every day | ORAL | Status: DC
  Administered 2018-03-25 – 2018-03-26 (×2): 1 mg via ORAL
  Filled 2018-03-25 (×2): qty 1

## 2018-03-25 MED ORDER — THIAMINE HCL 100 MG/ML IJ SOLN: 100.0000 mg | Freq: Every day | INTRAMUSCULAR | Status: DC

## 2018-03-25 MED ORDER — LISINOPRIL 20 MG PO TABS
20.0000 mg | ORAL_TABLET | Freq: Every day | ORAL | Status: DC
  Administered 2018-03-25 – 2018-03-26 (×2): 20 mg via ORAL
  Filled 2018-03-25 (×2): qty 1

## 2018-03-25 MED ORDER — LORAZEPAM 2 MG/ML IJ SOLN
1.0000 mg | Freq: Four times a day (QID) | INTRAMUSCULAR | Status: DC | PRN
  Administered 2018-03-25: 1 mg via INTRAVENOUS

## 2018-03-25 MED ORDER — SENNOSIDES-DOCUSATE SODIUM 8.6-50 MG PO TABS: 1.0000 | ORAL_TABLET | Freq: Every evening | ORAL | Status: DC | PRN

## 2018-03-25 MED ORDER — ENOXAPARIN SODIUM 40 MG/0.4ML ~~LOC~~ SOLN
40.0000 mg | SUBCUTANEOUS | Status: DC
  Administered 2018-03-25: 40 mg via SUBCUTANEOUS
  Filled 2018-03-25: qty 0.4

## 2018-03-25 MED ORDER — ADULT MULTIVITAMIN W/MINERALS CH
1.0000 | ORAL_TABLET | Freq: Every day | ORAL | Status: DC
  Administered 2018-03-25 – 2018-03-26 (×2): 1 via ORAL
  Filled 2018-03-25 (×2): qty 1

## 2018-03-25 MED ORDER — LORAZEPAM 2 MG/ML IJ SOLN
0.0000 mg | Freq: Four times a day (QID) | INTRAMUSCULAR | Status: DC
  Administered 2018-03-25: 2 mg via INTRAVENOUS
  Filled 2018-03-25 (×2): qty 1

## 2018-03-25 NOTE — H&P (Signed)
[place] [facility] [employer] NAME: Adam Good    MR#:  [id number]  DATE OF BIRTH:  September 28, 1945  DATE OF ADMISSION:  03/24/2018  PRIMARY CARE PHYSICIAN: Patient, No Pcp Per   REQUESTING/REFERRING PHYSICIAN: Nena Polio, MD  CHIEF COMPLAINT:   Chief Complaint  Patient presents with  . Alcohol Intoxication    HISTORY OF PRESENT ILLNESS:  Adam Good  is a 73 y.o. male with a known history of EtOH abuse/dependence, mild/mod aortic stenosis (as of 10/30/2017 Echo) p/w EtOH abuse/dependence/intoxication, Troponin elevation. Pt AAOx3, but is lethargic and a poor historian, likely because he is acutely intoxicated at the time of my assessment. His breath smells of ketones. He is unkempt/disheveled. He tells me he, "fell out on the street". He says his, "legs gave out". He says an ambulance came to pick him up. EtOH 332. 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 [employer] [place] [employer] veteran. He says he used to go to the [place].  Trop-I 0.10, 0.11, 0.12. 10/30/2017 Echo (+) "Mild to moderate stenosis. Peak velocity (S): 310 cm/s. Mean gradient (S): 22 mm Hg".  PAST MEDICAL HISTORY:   Past Medical History:  Diagnosis Date  . Alcohol abuse   . Aortic stenosis   . Bipolar disorder ([place])   . Demand ischemia of myocardium ([place]) 01/2014   a. positive troponins in the setting of a fall during intoxication; b. Myoview 2/16: EF 65-70%, no evi of ischemia or infarct. No RWMA.; c. MV 9/16: mild ischemia in the mid anterolateral, mid anterior, basal anterolateral and basal anterior segments, patietn declined LHC  . Diastolic dysfunction    a. TTE 9/16: EF of 50-55%, Gr2DD, severely dilated LA, and moderate AS; b. TTE 3/19: EF of 50-55%, Gr1DD, mildly dilated left atrium, mildly calcified mitral leaflets, mild AS, mild TR, trace PR, RVSP 46 mmHg, trivial pericardial effusion  . Dyslipidemia   . Essential hypertension   . Gastroesophageal reflux disease   . HLD (hyperlipidemia)   .  Sleep apnea   . Stroke [facility])     PAST SURGICAL HISTORY:   Past Surgical History:  Procedure Laterality Date  . HERNIA REPAIR    . KNEE SURGERY    . NM MYOVIEW LTD  Jan 2009; 05/12/14   a) No ischemia/infarct. Normal EF - 73%; b) EF 65-70%, no evidence of ischemia or infarction. No RWMA.    SOCIAL HISTORY:   Social History   Tobacco Use  . Smoking status: Current Every Day Smoker    Packs/day: 0.50    Years: 57.00    Pack years: 28.50    Types: Cigarettes  . Smokeless tobacco: Current User    Types: Snuff  Substance Use Topics  . Alcohol use: Yes    Alcohol/week: 0.0 standard drinks    Comment: He has been a heavy alcohol drinker for > 30 yrs.  Stats that he quit 3 weeks ago.     FAMILY HISTORY:   Family History  Problem Relation Age of Onset  . Throat cancer Mother   . Cancer Father   . CAD Sister   . Hypertension Sister   . Diabetes Brother     DRUG ALLERGIES:  No Known Allergies  REVIEW OF SYSTEMS:   Review of Systems  Unable to perform ROS: Mental status change  Musculoskeletal: Positive for falls.  Neurological: Positive for weakness.   Acutely intoxicated. MEDICATIONS AT HOME:   Prior to Admission medications   Medication Sig Start  Date End Date Taking? Authorizing Provider  albuterol (PROVENTIL HFA;VENTOLIN HFA) 108 (90 Base) MCG/ACT inhaler Inhale 1-2 puffs into the lungs every 6 (six) hours as needed for wheezing or shortness of breath.   Yes [provider]  amLODipine (NORVASC) 5 MG tablet Take 5 mg by mouth daily.   Yes [provider]  aspirin 81 MG chewable tablet Chew 81 mg by mouth daily.   Yes [provider]  carvedilol (COREG) 3.125 MG tablet Take 3.125 mg by mouth 2 (two) times daily.   Yes [provider]  lisinopril (PRINIVIL,ZESTRIL) 20 MG tablet Take 20 mg by mouth daily.   Yes [provider]  rosuvastatin (CRESTOR) 20 MG tablet Take 20 mg by mouth at bedtime.   Yes [provider]      VITAL SIGNS:  Blood pressure (!) 152/94, pulse 93, temperature 98 F (36.7 C), temperature source Oral, resp. rate 18, height 5\' 6"  (1.676 m), weight 68 kg, SpO2 96 %.  PHYSICAL EXAMINATION:  Physical Exam Constitutional:      General: He is not in acute distress.    Appearance: He is normal weight. He is ill-appearing. He is not toxic-appearing or diaphoretic.  HENT:     Head: Atraumatic.  Eyes:     General: No scleral icterus.    Extraocular Movements: Extraocular movements intact.     Conjunctiva/sclera: Conjunctivae normal.  Neck:     Musculoskeletal: Neck supple.  Cardiovascular:     Rate and Rhythm: Normal rate and regular rhythm.     Heart sounds: Normal heart sounds. No murmur. No friction rub. No gallop.   Pulmonary:     Effort: No respiratory distress.     Breath sounds: Normal breath sounds. No stridor. No wheezing, rhonchi or rales.  Abdominal:     General: Bowel sounds are normal. There is no distension.     Palpations: Abdomen is soft.     Tenderness: There is no abdominal tenderness. There is no guarding or rebound.  Musculoskeletal: Normal range of motion.        General: No swelling or tenderness.     Right lower leg: No edema.     Left lower leg: No edema.  Lymphadenopathy:     Cervical: No cervical adenopathy.  Skin:    General: Skin is warm and dry.     Findings: No erythema or rash.  Neurological:     Mental Status: He is oriented to person, place, and time.  Psychiatric:        Mood and Affect: Mood normal.        Behavior: Behavior is cooperative.        Cognition and Memory: He exhibits impaired remote memory.    LABORATORY PANEL:   CBC Recent Labs  Lab 03/24/18 1718  WBC 10.0  HGB 14.4  HCT 43.1  PLT 181   ------------------------------------------------------------------------------------------------------------------  Chemistries  Recent Labs  Lab 03/24/18 1718 03/25/18 0523  NA 134*  --   K 3.9  --   CL 94*  --     CO2 25  --   GLUCOSE 88  --   BUN 12  --   CREATININE 0.87  --   CALCIUM 8.4*  --   MG  --  1.9  AST 56*  --   ALT 24  --   ALKPHOS 88  --   BILITOT 1.2  --    ------------------------------------------------------------------------------------------------------------------  Cardiac Enzymes Recent Labs  Lab 03/25/18 0523  TROPONINI 0.12*   ------------------------------------------------------------------------------------------------------------------  RADIOLOGY:  Dg Chest 2 View  Result Date: 03/24/2018 CLINICAL DATA:  Altered mental status EXAM: CHEST - 2 VIEW COMPARISON:  10/29/2017 FINDINGS: No acute consolidation or effusion. Cardiomediastinal silhouette within normal limits. No pneumothorax. Old bilateral rib fractures. Small hiatal hernia. IMPRESSION: No active cardiopulmonary disease.  Hiatal hernia Electronically Signed   By: Donavan Foil M.D.   On: 03/24/2018 18:24   Ct Head Wo Contrast  Result Date: 03/24/2018 CLINICAL DATA:  Altered LOC EXAM: CT HEAD WITHOUT CONTRAST TECHNIQUE: Contiguous axial images were obtained from the base of the skull through the vertex without intravenous contrast. COMPARISON:  11/20/2017 CT FINDINGS: Brain: No acute territorial infarction, hemorrhage or intracranial mass. Old left cerebellar infarct. Chronic lacunar infarct left basal ganglia. Atrophy with mild small vessel ischemic changes of the white matter. Stable ventricle size. Vascular: No hyperdense vessel.  Carotid vascular calcification Skull: Normal. Negative for fracture or focal lesion. Sinuses/Orbits: Probable chronic fracture deformity of the nasal bones and left lateral maxillary sinus wall. Other: None IMPRESSION: 1. No CT evidence for acute intracranial abnormality. 2. Atrophy and mild small vessel ischemic changes of the white matter. Old left cerebellar infarct. Electronically Signed   By: Donavan Foil M.D.   On: 03/24/2018 18:22   IMPRESSION AND PLAN:   A/P: 59M w/ PMHx  EtOH abuse/dependence, mild/mod aortic stenosis (as of 10/30/2017 Echo) p/w EtOH abuse/dependence/intoxication, Troponin elevation. Hyponatremia, hypochloremia, hypocalcemia, transaminasemia. -EtOH abuse/dependence/intoxication, transaminasemia: CIWA, thiamine, folate, MVI. No Tylenol. -Trop-I elevation: Trop-I relatively stable, suspect leak. EKG (-) STEMI. May be due to AS. Rpt Echo pending. -Hyponatremia, hypochloremia, hypocalcemia: IVF. Ionized calcium. -c/w home meds/formulary subs. -FEN/GI: Cardiac diet. -DVT PPx: Lovenox. -Code status: Full code. -Disposition: Admission, > 2 midnights.   All the records are reviewed and case discussed with ED provider. Management plans discussed with the patient, family and they are in agreement.  CODE STATUS: Full code.  TOTAL TIME TAKING CARE OF THIS PATIENT: 75 minutes.    Arta Silence M.D on 03/25/2018 at 9:01 AM  Between 7am to 6pm - Pager - [phone]  After 6pm go to [email or web address] - Proofreader  [facility]  Office  [phone]  CC: Primary care physician; Patient, No Pcp Per   Note: This dictation was prepared with Dragon dictation along with smaller phrase technology. Any transcriptional errors that result from this process are unintentional.

## 2018-03-25 NOTE — ED Notes (Signed)
Patient transferred from room [address] 22 to 235 floor, patient was asleep until technician began moving the bed. Patient woke up for a minute and was alert and oriented. NAD noted

## 2018-03-25 NOTE — Plan of Care (Signed)
  Problem: Activity: Goal: Risk for activity intolerance will decrease Outcome: Progressing   Problem: Nutrition: Goal: Adequate nutrition will be maintained Outcome: Progressing   Problem: Coping: Goal: Level of anxiety will decrease Outcome: Progressing   Problem: Safety: Goal: Ability to remain free from injury will improve Outcome: Progressing   Problem: Skin Integrity: Goal: Risk for impaired skin integrity will decrease Outcome: Progressing   

## 2018-03-25 NOTE — Plan of Care (Signed)
  Problem: Clinical Measurements: Goal: Ability to maintain clinical measurements within normal limits will improve Outcome: Progressing   Problem: Nutrition: Goal: Adequate nutrition will be maintained Outcome: Progressing   Problem: Safety: Goal: Ability to remain free from injury will improve Outcome: Progressing   

## 2018-03-25 NOTE — Consult Note (Signed)
[facility] Face-to-Face Psychiatry Consult   Reason for Consult:  Alcohol use disorder Referring Physician: Dr. Genia Harold Patient Identification: GIL INGWERSEN MRN:  [id number] Principal Diagnosis: Demand ischemia of myocardium [facility]) Diagnosis:  Principal Problem:   Demand ischemia of myocardium [facility]) Active Problems:   Dyslipidemia   Essential hypertension   Alcohol abuse with uncomplicated intoxication ([place])   Total Time spent with patient: 1 hour  Subjective: "I have been drinking too much lately".  HPI:  Adam Good is a 73 y.o. male patient admitted with a history of alcohol use disorder, currently severe and a documented history of bipolar disorder.  Psychiatry consult requested to evaluate patient for alcohol use disorder, concerns for patient safety, evaluation of mental health status and patient desire for rehabilitation.  On evaluation today, patient is alert, and oriented to person, place and situation.  He is not oriented to date.  Patient states that he is a veteran of the [place] [employer].  He previously has had treatment for alcohol abuse in a 14-month stay at the [facility] in [place].  He also describes being at the [facility] in dorm approximately 2 weeks ago after "I got into it with my brother".  Patient states since getting out of the [facility], during hospital he has been drinking 340 ounce beers daily.  Patient endorses a remote suicide attempt 35 years ago by cutting his left arm.  He believes at this time he had an inpatient stay at [facility], and states he would be willing to go back there.  Patient denies any suicidal or homicidal ideation.  He denies any current or past auditory or visual hallucinations.  He denies any symptoms of depression, or anxiety.  He denies any mania.  He does not appear to be responding to internal stimuli at time of evaluation.  Patient states that prior to relapse he had been off of alcohol for 1 year.  He does not wish to go to any rehabilitation programs.   He states "I can quit on my own, I have gone to AA in the past."  Patient states that he does not wish to continue living with his brother who is 79 year older, and brother's wife.  He reports that they have gotten along better when not living together.  Past Psychiatric History: Bipolar disorder per records, however patient is unaware of this diagnosis at this time.  Risk to Self:  No Risk to Others:  No  Prior Inpatient Therapy:   Patient states was at [facility] and 7 months [place]; most recent 2 weeks ago at [facility] "got into it with my brother after drinking".  Prior Outpatient Therapy:   Through [place]  Past Medical History:  Past Medical History:  Diagnosis Date  . Alcohol abuse   . Aortic stenosis   . Bipolar disorder ([place])   . Demand ischemia of myocardium ([place]) 01/2014   a. positive troponins in the setting of a fall during intoxication; b. Myoview 2/16: EF 65-70%, no evi of ischemia or infarct. No RWMA.; c. MV 9/16: mild ischemia in the mid anterolateral, mid anterior, basal anterolateral and basal anterior segments, patietn declined LHC  . Diastolic dysfunction    a. TTE 9/16: EF of 50-55%, Gr2DD, severely dilated LA, and moderate AS; b. TTE 3/19: EF of 50-55%, Gr1DD, mildly dilated left atrium, mildly calcified mitral leaflets, mild AS, mild TR, trace PR, RVSP 46 mmHg, trivial pericardial effusion  . Dyslipidemia   . Essential hypertension   .  Gastroesophageal reflux disease   . HLD (hyperlipidemia)   . Sleep apnea   . Stroke [facility])     Past Surgical History:  Procedure Laterality Date  . HERNIA REPAIR    . KNEE SURGERY    . NM MYOVIEW LTD  Jan 2009; 05/12/14   a) No ischemia/infarct. Normal EF - 73%; b) EF 65-70%, no evidence of ischemia or infarction. No RWMA.   Family History: Reports he has a sister and other family members that have died from old age. Family History  Problem Relation Age of Onset  . Throat cancer Mother   . Cancer Father   .  CAD Sister   . Hypertension Sister   . Diabetes Brother    Family Psychiatric  History: Denies  Social History:  Social History   Substance and Sexual Activity  Alcohol Use Yes  . Alcohol/week: 0.0 standard drinks   Comment: He has been a heavy alcohol drinker for > 30 yrs.  Stats that he quit 3 weeks ago.      Social History   Substance and Sexual Activity  Drug Use No    Social History   Socioeconomic History  . Marital status: Married    Spouse name: Not on file  . Number of children: Not on file  . Years of education: Not on file  . Highest education level: Not on file  Occupational History  . Not on file  Social Needs  . Financial resource strain: Not on file  . Food insecurity:    Worry: Not on file    Inability: Not on file  . Transportation needs:    Medical: Not on file    Non-medical: Not on file  Tobacco Use  . Smoking status: Current Every Day Smoker    Packs/day: 0.50    Years: 57.00    Pack years: 28.50    Types: Cigarettes  . Smokeless tobacco: Current User    Types: Snuff  Substance and Sexual Activity  . Alcohol use: Yes    Alcohol/week: 0.0 standard drinks    Comment: He has been a heavy alcohol drinker for > 30 yrs.  Stats that he quit 3 weeks ago.   . Drug use: No  . Sexual activity: Not on file  Lifestyle  . Physical activity:    Days per week: Not on file    Minutes per session: Not on file  . Stress: Not on file  Relationships  . Social connections:    Talks on phone: Not on file    Gets together: Not on file    Attends religious service: Not on file    Active member of club or organization: Not on file    Attends meetings of clubs or organizations: Not on file    Relationship status: Not on file  Other Topics Concern  . Not on file  Social History Narrative  . Not on file   Additional Social History:   Had been living with his 59 year old brother and his wife, but does not want to return to their home. Alcohol use number  340 ounce beers daily. He denies other substance use.  Allergies:  No Known Allergies  Labs:  Results for orders placed or performed during the hospital encounter of 03/24/18 (from the past 48 hour(s))  Comprehensive metabolic panel     Status: Abnormal   Collection Time: 03/24/18  5:18 PM  Result Value Ref Range   Sodium 134 (L) 135 - 145 mmol/L  Potassium 3.9 3.5 - 5.1 mmol/L   Chloride 94 (L) 98 - 111 mmol/L   CO2 25 22 - 32 mmol/L   Glucose, Bld 88 70 - 99 mg/dL   BUN 12 8 - 23 mg/dL   Creatinine, Ser 0.87 0.61 - 1.24 mg/dL   Calcium 8.4 (L) 8.9 - 10.3 mg/dL   Total Protein 6.9 6.5 - 8.1 g/dL   Albumin 4.2 3.5 - 5.0 g/dL   AST 56 (H) 15 - 41 U/L   ALT 24 0 - 44 U/L   Alkaline Phosphatase 88 38 - 126 U/L   Total Bilirubin 1.2 0.3 - 1.2 mg/dL   GFR calc non Af Amer >60 >60 mL/min   GFR calc Af Amer >60 >60 mL/min   Anion gap 15 5 - 15    Comment: Performed at [facility], [address], [place], [place] [address]  Ethanol     Status: Abnormal   Collection Time: 03/24/18  5:18 PM  Result Value Ref Range   Alcohol, Ethyl (B) 332 (HH) <10 mg/dL    Comment: CRITICAL RESULT CALLED TO, READ BACK BY AND VERIFIED WITH RN ALICIA @1804  03/24/18 AKT (NOTE) Lowest detectable limit for serum alcohol is 10 mg/dL. For medical purposes only. Performed at [facility], [address] [place], [place] [address]   cbc     Status: None   Collection Time: 03/24/18  5:18 PM  Result Value Ref Range   WBC 10.0 4.0 - 10.5 K/uL   RBC 4.62 4.22 - 5.81 MIL/uL   Hemoglobin 14.4 13.0 - 17.0 g/dL   HCT 43.1 39.0 - 52.0 %   MCV 93.3 80.0 - 100.0 fL   MCH 31.2 26.0 - 34.0 pg   MCHC 33.4 30.0 - 36.0 g/dL   RDW 12.0 11.5 - 15.5 %   Platelets 181 150 - 400 K/uL   nRBC 0.0 0.0 - 0.2 %    Comment: Performed at [facility], [address] [place], [place] [address]  Troponin I - Add-On to previous collection     Status: Abnormal   Collection Time: 03/24/18  5:18 PM   Result Value Ref Range   Troponin I 0.10 (HH) <0.03 ng/mL    Comment: CRITICAL RESULT CALLED TO, READ BACK BY AND VERIFIED WITH RN ALICIA @1832  03/24/18 AKT Performed at [facility], [address] [place], [place] [address]   Troponin I - Once     Status: Abnormal   Collection Time: 03/24/18  7:20 PM  Result Value Ref Range   Troponin I 0.11 (HH) <0.03 ng/mL    Comment: CRITICAL VALUE NOTED. VALUE IS CONSISTENT WITH PREVIOUSLY REPORTED/CALLED VALUE AKT Performed at [facility], [address] [place], [place] [address]   Urine Drug Screen, Qualitative     Status: None   Collection Time: 03/24/18 11:19 PM  Result Value Ref Range   Tricyclic, Ur Screen NONE DETECTED NONE DETECTED   Amphetamines, Ur Screen NONE DETECTED NONE DETECTED   MDMA (Ecstasy)Ur Screen NONE DETECTED NONE DETECTED   Cocaine Metabolite,Ur [place] NONE DETECTED NONE DETECTED   Opiate, Ur Screen NONE DETECTED NONE DETECTED   Phencyclidine (PCP) Ur S NONE DETECTED NONE DETECTED   Cannabinoid 50 Ng, Ur [place] NONE DETECTED NONE DETECTED   Barbiturates, Ur Screen NONE DETECTED NONE DETECTED   Benzodiazepine, Ur Scrn NONE DETECTED NONE DETECTED   Methadone Scn, Ur NONE DETECTED NONE DETECTED    Comment: (NOTE) Tricyclics + metabolites, urine  Cutoff 1000 ng/mL Amphetamines + metabolites, urine  Cutoff 1000 ng/mL MDMA (Ecstasy), urine              Cutoff 500 ng/mL Cocaine Metabolite, urine          Cutoff 300 ng/mL Opiate + metabolites, urine        Cutoff 300 ng/mL Phencyclidine (PCP), urine         Cutoff 25 ng/mL Cannabinoid, urine                 Cutoff 50 ng/mL Barbiturates + metabolites, urine  Cutoff 200 ng/mL Benzodiazepine, urine              Cutoff 200 ng/mL Methadone, urine                   Cutoff 300 ng/mL The urine drug screen provides only a preliminary, unconfirmed analytical test result and should not be used for non-medical purposes. Clinical consideration and professional judgment  should be applied to any positive drug screen result due to possible interfering substances. A more specific alternate chemical method must be used in order to obtain a confirmed analytical result. Gas chromatography / mass spectrometry (GC/MS) is the preferred confirmat ory method. Performed at [facility], [address] [place], [place] [address]   Urinalysis, Routine w reflex microscopic     Status: Abnormal   Collection Time: 03/24/18 11:19 PM  Result Value Ref Range   Color, Urine STRAW (A) YELLOW   APPearance CLEAR (A) CLEAR   Specific Gravity, Urine 1.004 (L) 1.005 - 1.030   pH 5.0 5.0 - 8.0   Glucose, UA NEGATIVE NEGATIVE mg/dL   Hgb urine dipstick NEGATIVE NEGATIVE   Bilirubin Urine NEGATIVE NEGATIVE   Ketones, ur 5 (A) NEGATIVE mg/dL   Protein, ur NEGATIVE NEGATIVE mg/dL   Nitrite NEGATIVE NEGATIVE   Leukocytes, UA NEGATIVE NEGATIVE    Comment: Performed at [facility], [address], [place], [place] [address]  Magnesium     Status: None   Collection Time: 03/25/18  5:23 AM  Result Value Ref Range   Magnesium 1.9 1.7 - 2.4 mg/dL    Comment: Performed at [facility], [address] [place], [place] [address]  Phosphorus     Status: None   Collection Time: 03/25/18  5:23 AM  Result Value Ref Range   Phosphorus 3.6 2.5 - 4.6 mg/dL    Comment: Performed at [facility], [address] [place], [place] [address]  Troponin I - Once     Status: Abnormal   Collection Time: 03/25/18  5:23 AM  Result Value Ref Range   Troponin I 0.12 (HH) <0.03 ng/mL    Comment: CRITICAL VALUE NOTED. VALUE IS CONSISTENT WITH PREVIOUSLY REPORTED/CALLED VALUE DAS Performed at [facility], [address] [place], [place] [address]     Current Facility-Administered Medications  Medication Dose Route Frequency Provider Last Rate Last Dose  . albuterol (PROVENTIL) (2.5 MG/3ML) 0.083% nebulizer solution 2.5 mg  2.5 mg Nebulization Q6H  PRN Arta Silence, MD      . amLODipine (NORVASC) tablet 5 mg  5 mg Oral Daily Arta Silence, MD      . aspirin EC tablet 81 mg  81 mg Oral Daily Sridharan, Prasanna, MD      . bisacodyl (DULCOLAX) EC tablet 5 mg  5 mg Oral Daily PRN Arta Silence, MD      . carvedilol (COREG) tablet 3.125 mg  3.125  mg Oral BID Arta Silence, MD      . enoxaparin (LOVENOX) injection 40 mg  40 mg Subcutaneous Q24H Arta Silence, MD      . folic acid (FOLVITE) tablet 1 mg  1 mg Oral Daily Jodell Cipro, Prasanna, MD      . lisinopril (PRINIVIL,ZESTRIL) tablet 20 mg  20 mg Oral Daily Sridharan, Prasanna, MD      . LORazepam (ATIVAN) injection 0-4 mg  0-4 mg Intravenous Q6H Arta Silence, MD   2 mg at 03/25/18 4008   Followed by  . [START ON 03/27/2018] LORazepam (ATIVAN) injection 0-4 mg  0-4 mg Intravenous Q12H Sridharan, Prasanna, MD      . LORazepam (ATIVAN) tablet 1 mg  1 mg Oral Q6H PRN Arta Silence, MD       Or  . LORazepam (ATIVAN) injection 1 mg  1 mg Intravenous Q6H PRN Arta Silence, MD      . multivitamin with minerals tablet 1 tablet  1 tablet Oral Daily Arta Silence, MD      . ondansetron (ZOFRAN) tablet 4 mg  4 mg Oral Q6H PRN Arta Silence, MD       Or  . ondansetron (ZOFRAN) injection 4 mg  4 mg Intravenous Q6H PRN Arta Silence, MD      . rosuvastatin (CRESTOR) tablet 20 mg  20 mg Oral QHS Arta Silence, MD      . senna-docusate (Senokot-S) tablet 1 tablet  1 tablet Oral QHS PRN Arta Silence, MD      . thiamine (VITAMIN B-1) tablet 100 mg  100 mg Oral Daily Arta Silence, MD       Or  . thiamine (B-1) injection 100 mg  100 mg Intravenous Daily Arta Silence, MD        Musculoskeletal: Strength & Muscle Tone: within normal limits Gait & Station: unsteady Patient leans: N/A  Psychiatric Specialty Exam: Physical Exam  Constitutional: He is oriented to person, place, and time. He appears well-developed and  well-nourished. He appears distressed.  HENT:  Head: Normocephalic and atraumatic.  Eyes: EOM are normal.  Neck: Normal range of motion.  Cardiovascular: Normal rate.  Respiratory: Effort normal.  Musculoskeletal: Normal range of motion.  Neurological: He is alert and oriented to person, place, and time.  Skin: Skin is warm and dry.    Review of Systems  Constitutional: Negative.   HENT: Negative.   Eyes: Negative.   Respiratory: Negative.   Cardiovascular: Negative.   Gastrointestinal: Negative.   Musculoskeletal: Negative.   Skin: Negative.   Neurological: Positive for tremors.  Psychiatric/Behavioral: Positive for substance abuse (alcohol). Negative for depression, hallucinations and suicidal ideas. The patient is not nervous/anxious and does not have insomnia.     Blood pressure (!) 152/94, pulse 93, temperature 98 F (36.7 C), temperature source Oral, resp. rate 18, height 5\' 6"  (1.676 m), weight 68 kg, SpO2 96 %.Body mass index is 24.21 kg/m.  General Appearance: Disheveled  Eye Contact:  Fair  Speech:  Garbled  Volume:  Normal  Mood:  Euthymic  Affect:  Appropriate  Thought Process:  Coherent and Descriptions of Associations: Tangential  Orientation:  Other:  Person and place only  Thought Content:  Logical and Hallucinations: None  Suicidal Thoughts:  No  Homicidal Thoughts:  No  Memory:  Immediate;   Fair Recent;   Fair Remote;   Fair  Judgement:  Impaired  Insight:  Shallow  Psychomotor Activity:  Tremor  Concentration:  Concentration: Fair and Attention Span: Fair  Recall:  [place] of Knowledge:  Fair  Language:  Fair  Akathisia:  No  Handed:  Right  AIMS (if indicated):   n/a  Assets:  Resilience  ADL's:  Intact  Cognition:  Impaired,  Mild  Sleep:   adequate     Treatment Plan Summary: Daily contact with patient to assess and evaluate symptoms and progress in treatment and Plan No medications recommended at this time.  Patient is not  exhibiting mood disorder symptoms.  Disposition: No evidence of imminent risk to self or others at present.   Patient does not meet criteria for psychiatric inpatient admission. Supportive therapy provided about ongoing stressors. Discussed crisis plan, support from social network, calling 911, coming to the Emergency Department, and calling Suicide Hotline. Patient has capacity for decision making.  Appreciate social work support and assistance with the patient for outpatient resources for alcohol rehabilitation programs to include [facility], as well as resources for housing.  Lavella Hammock, MD 03/25/2018 9:28 AM   Note:  This document was prepared using Dragon voice recognition software and may include unintentional dictation errors.

## 2018-03-25 NOTE — Progress Notes (Signed)
Patient admitted this morning for alcohol withdrawal and elevated troponin. Agree with MD admitting plan.  I will also consult psychiatry  Continue to follow troponins  Follow-up on echocardiogram

## 2018-03-26 ENCOUNTER — Inpatient Hospital Stay (HOSPITAL_COMMUNITY)
Admit: 2018-03-26 | Discharge: 2018-03-26 | Disposition: A | Discharge status: Home / Self Care | Payer: Medicare Other | Attending: Internal Medicine | Admitting: Internal Medicine

## 2018-03-26 DIAGNOSIS — I340 Nonrheumatic mitral (valve) insufficiency: Secondary | ICD-10-CM

## 2018-03-26 DIAGNOSIS — I370 Nonrheumatic pulmonary valve stenosis: Secondary | ICD-10-CM

## 2018-03-26 DIAGNOSIS — I361 Nonrheumatic tricuspid (valve) insufficiency: Secondary | ICD-10-CM

## 2018-03-26 LAB — ECHOCARDIOGRAM COMPLETE
Height: 68 in
Weight: 2347.2 oz

## 2018-03-26 LAB — BASIC METABOLIC PANEL
Anion gap: 10 (ref 5–15)
BUN: 18 mg/dL (ref 8–23)
CO2: 27 mmol/L (ref 22–32)
Calcium: 8.4 mg/dL — ABNORMAL LOW (ref 8.9–10.3)
Chloride: 103 mmol/L (ref 98–111)
Creatinine, Ser: 0.94 mg/dL (ref 0.61–1.24)
GFR calc Af Amer: >60 mL/min (ref >60)
GFR calc non Af Amer: >60 mL/min (ref >60)
GLUCOSE: 99 mg/dL (ref 70–99)
Potassium: 3.6 mmol/L (ref 3.5–5.1)
Sodium: 140 mmol/L (ref 135–145)

## 2018-03-26 LAB — CALCIUM, IONIZED: Calcium, Ionized, Serum: 4.4 mg/dL — ABNORMAL LOW (ref 4.5–5.6)

## 2018-03-26 MED ORDER — THIAMINE HCL 100 MG PO TABS: 100.0000 mg | ORAL_TABLET | Freq: Every day | ORAL | 0 refills | Status: DC

## 2018-03-26 NOTE — Clinical Social Work Note (Signed)
CSW received consult for patient needing resources for alcohol use, and homeless issues.  Patient reported to psych that he did not want to go to any rehab again, and he was at rehab for about 7 weeks at the [facility] in [place].  Patient reported to psych that he can quit when he wants to, patient also said that he is aware of the [place] meetings and can go when he wants to.  Patient states he is living with his brother, and sometimes gets into with him.  Patient stated he will take the bus to the mall or he will pay for a cab.  He did not request any other assistance from [facility].  CSW signing off, please reconsult if social work needs arise.  Jones Broom. Norval Morton, MSW, [place]  03/26/2018 12:47 PM

## 2018-03-26 NOTE — Discharge Summary (Signed)
[facility] NAME: Adam Good    MR#:  [id number]  DATE OF BIRTH:  05-16-1945  DATE OF ADMISSION:  03/24/2018 ADMITTING PHYSICIAN: Arta Silence, MD  DATE OF DISCHARGE: 03/26/2018   PRIMARY CARE PHYSICIAN: [facility]    ADMISSION DIAGNOSIS:  Elevated troponin [J62.83] Alcoholic intoxication with complication ([place]) [M62.947]  DISCHARGE DIAGNOSIS:  Principal Problem:   Demand ischemia of myocardium ([place]) Active Problems:   Dyslipidemia   Essential hypertension   Alcohol abuse with uncomplicated intoxication ([place])   SECONDARY DIAGNOSIS:   Past Medical History:  Diagnosis Date  . Alcohol abuse   . Aortic stenosis   . Bipolar disorder ([place])   . Demand ischemia of myocardium ([place]) 01/2014   a. positive troponins in the setting of a fall during intoxication; b. Myoview 2/16: EF 65-70%, no evi of ischemia or infarct. No RWMA.; c. MV 9/16: mild ischemia in the mid anterolateral, mid anterior, basal anterolateral and basal anterior segments, patietn declined LHC  . Diastolic dysfunction    a. TTE 9/16: EF of 50-55%, Gr2DD, severely dilated LA, and moderate AS; b. TTE 3/19: EF of 50-55%, Gr1DD, mildly dilated left atrium, mildly calcified mitral leaflets, mild AS, mild TR, trace PR, RVSP 46 mmHg, trivial pericardial effusion  . Dyslipidemia   . Essential hypertension   . Gastroesophageal reflux disease   . HLD (hyperlipidemia)   . Sleep apnea   . 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  1.  Elevated troponin: Troponins are flat.  Elevation in troponin is due to demand ischemia. No abnormality noted on EKG or telemetry.  2.  EtOH abuse: Patient was counseled and was placed on CIWA protocol with uneventful detox.  3.Tobacco dependence: Patient is  encouraged to quit smoking. Counseling was provided for 4 minutes.  4.  Chronic diastolic heart failure without signs exacerbation: Patient can follow-up with the [facility]  5.  Essential hypertension: Continue Norvasc, lisinopril and Coreg.  6.  Hyperlipidemia: Continue Crestor    DISCHARGE CONDITIONS AND DIET:   Stable Cardiac diet  CONSULTS OBTAINED:  Treatment Team:  Arta Silence, MD Lavella Hammock, MD  DRUG ALLERGIES:  No Known Allergies  DISCHARGE MEDICATIONS:   Allergies as of 03/26/2018   No Known Allergies     Medication List    TAKE these medications   albuterol 108 (90 Base) MCG/ACT inhaler Commonly known as:  PROVENTIL HFA;VENTOLIN HFA Inhale 1-2 puffs into the lungs every 6 (six) hours as needed for wheezing or shortness of breath.   amLODipine 5 MG tablet Commonly known as:  NORVASC Take 5 mg by mouth daily.   aspirin 81 MG chewable tablet Chew 81 mg by mouth daily.   carvedilol 3.125 MG tablet Commonly known as:  COREG Take 3.125 mg by mouth 2 (two) times daily.   lisinopril 20 MG tablet Commonly known as:  PRINIVIL,ZESTRIL Take 20 mg by mouth daily.   rosuvastatin 20 MG tablet Commonly known as:  CRESTOR Take 20 mg by mouth at bedtime.   thiamine 100 MG tablet Take 1 tablet (100 mg total) by mouth daily. Start taking on:  March 27, 2018            Durable Medical Equipment  (From admission, onward)  Start     Ordered   03/26/18 1022  For home use only DME Walker  [facility])  Once    Question:  Patient needs a walker to treat with the following condition  Answer:  Weakness   03/26/18 1022            Today   CHIEF COMPLAINT:  Doing fine this am no chest pain no SOB   VITAL SIGNS:  Blood pressure (!) 149/83, pulse 79, temperature 98 F (36.7 C), temperature source Oral, resp. rate 20, height 5\' 8"  (1.727 m), weight 66.5 kg, SpO2 100 %.   REVIEW OF SYSTEMS:  Review of Systems  Constitutional: Positive for  malaise/fatigue. Negative for chills and fever.  HENT: Negative.  Negative for ear discharge, ear pain, hearing loss, nosebleeds and sore throat.   Eyes: Negative.  Negative for blurred vision and pain.  Respiratory: Negative.  Negative for cough, hemoptysis, shortness of breath and wheezing.   Cardiovascular: Negative.  Negative for chest pain, palpitations and leg swelling.  Gastrointestinal: Negative.  Negative for abdominal pain, blood in stool, diarrhea, nausea and vomiting.  Genitourinary: Negative.  Negative for dysuria.  Musculoskeletal: Negative.  Negative for back pain.  Skin: Negative.   Neurological: Negative for dizziness, tremors, speech change, focal weakness, seizures and headaches.  Endo/Heme/Allergies: Negative.  Does not bruise/bleed easily.  Psychiatric/Behavioral: Negative.  Negative for depression, hallucinations and suicidal ideas.     PHYSICAL EXAMINATION:  GENERAL:  73 y.o.-year-old patient lying in the bed with no acute distress.  NECK:  Supple, no jugular venous distention. No thyroid enlargement, no tenderness.  LUNGS: Normal breath sounds bilaterally, no wheezing, rales,rhonchi  No use of accessory muscles of respiration.  CARDIOVASCULAR: S1, S2 normal. No murmurs, rubs, or gallops.  ABDOMEN: Soft, non-tender, non-distended. Bowel sounds present. No organomegaly or mass.  EXTREMITIES: No pedal edema, cyanosis, or clubbing.  PSYCHIATRIC: The patient is alert and oriented x 3.  SKIN: No obvious rash, lesion, or ulcer.   DATA REVIEW:   CBC Recent Labs  Lab 03/24/18 1718  WBC 10.0  HGB 14.4  HCT 43.1  PLT 181    Chemistries  Recent Labs  Lab 03/24/18 1718 03/25/18 0523 03/26/18 0403  NA 134*  --  140  K 3.9  --  3.6  CL 94*  --  103  CO2 25  --  27  GLUCOSE 88  --  99  BUN 12  --  18  CREATININE 0.87  --  0.94  CALCIUM 8.4*  --  8.4*  MG  --  1.9  --   AST 56*  --   --   ALT 24  --   --   ALKPHOS 88  --   --   BILITOT 1.2  --   --      Cardiac Enzymes Recent Labs  Lab 03/25/18 0858 03/25/18 1443 03/25/18 2024  TROPONINI 0.11* 0.10* 0.10*    Microbiology Results  @MICRORSLT48 @  RADIOLOGY:  Dg Chest 2 View  Result Date: 03/24/2018 CLINICAL DATA:  Altered mental status EXAM: CHEST - 2 VIEW COMPARISON:  10/29/2017 FINDINGS: No acute consolidation or effusion. Cardiomediastinal silhouette within normal limits. No pneumothorax. Old bilateral rib fractures. Small hiatal hernia. IMPRESSION: No active cardiopulmonary disease.  Hiatal hernia Electronically Signed   By: Donavan Foil M.D.   On: 03/24/2018 18:24   Ct Head Wo Contrast  Result Date: 03/24/2018 CLINICAL DATA:  Altered LOC EXAM: CT HEAD WITHOUT CONTRAST TECHNIQUE: Contiguous axial images  were obtained from the base of the skull through the vertex without intravenous contrast. COMPARISON:  11/20/2017 CT FINDINGS: Brain: No acute territorial infarction, hemorrhage or intracranial mass. Old left cerebellar infarct. Chronic lacunar infarct left basal ganglia. Atrophy with mild small vessel ischemic changes of the white matter. Stable ventricle size. Vascular: No hyperdense vessel.  Carotid vascular calcification Skull: Normal. Negative for fracture or focal lesion. Sinuses/Orbits: Probable chronic fracture deformity of the nasal bones and left lateral maxillary sinus wall. Other: None IMPRESSION: 1. No CT evidence for acute intracranial abnormality. 2. Atrophy and mild small vessel ischemic changes of the white matter. Old left cerebellar infarct. Electronically Signed   By: Donavan Foil M.D.   On: 03/24/2018 18:22      Allergies as of 03/26/2018   No Known Allergies     Medication List    TAKE these medications   albuterol 108 (90 Base) MCG/ACT inhaler Commonly known as:  PROVENTIL HFA;VENTOLIN HFA Inhale 1-2 puffs into the lungs every 6 (six) hours as needed for wheezing or shortness of breath.   amLODipine 5 MG tablet Commonly known as:  NORVASC Take 5 mg by  mouth daily.   aspirin 81 MG chewable tablet Chew 81 mg by mouth daily.   carvedilol 3.125 MG tablet Commonly known as:  COREG Take 3.125 mg by mouth 2 (two) times daily.   lisinopril 20 MG tablet Commonly known as:  PRINIVIL,ZESTRIL Take 20 mg by mouth daily.   rosuvastatin 20 MG tablet Commonly known as:  CRESTOR Take 20 mg by mouth at bedtime.   thiamine 100 MG tablet Take 1 tablet (100 mg total) by mouth daily. Start taking on:  March 27, 2018            Durable Medical Equipment  (From admission, onward)         Start     Ordered   03/26/18 1022  For home use only DME Gilford Rile  [facility])  Once    Question:  Patient needs a walker to treat with the following condition  Answer:  Weakness   03/26/18 1022             Management plans discussed with the patient and he is in agreement. Stable [facility] with [facility]  [facility]  CODE STATUS:     Code Status Orders  (From admission, onward)         Start     Ordered   03/25/18 0419  Full code  Continuous     03/25/18 0419        Code Status History    Date Active Date Inactive Code Status Order ID Comments User Context   03/24/2018 1819 03/25/2018 0419 Full Code [id number]  Nena Polio, MD ED   10/29/2017 1654 10/30/2017 1910 Full Code [id number]  Henreitta Leber, MD Inpatient      TOTAL TIME TAKING CARE OF THIS PATIENT: 38 minutes.    Note: This dictation was prepared with Dragon dictation along with smaller phrase technology. Any transcriptional errors that result from this process are unintentional.  Ruben Pyka M.D on 03/26/2018 at 11:54 AM  Between 7am to 6pm - Pager - [phone] After 6pm go to [email or web address] - password [email or web address] [facility]  Office  [phone]  CC: Primary care physician; [facility] [place]

## 2018-03-26 NOTE — Progress Notes (Signed)
Patient is being discharge home as per order, discharge instruction provided, iv removed, tele removed.

## 2018-03-26 NOTE — Evaluation (Addendum)
Physical Therapy Evaluation Patient Details Name: Adam Good MRN: [id number] DOB: 1946-02-08 Today's Date: 03/26/2018   History of Present Illness  Pt is 47 male presenting with ETOH abuse/intoxication, troponin elevatino, with a known history of EtOH abuse/dependence, mild/mod aortic stenosis (as of 10/30/2017 Echo). Confirmed with Dr. Bettey Costa troponin elevation due to demand ischemia.     Clinical Impression  Pt A&Ox3, disoriented to time, pt anxious during session and slightly impulsive with ambulation but behavior WFLs during session. Pt reported that previously he was living with his brother and does not plan on returning there, stated he was going to stay in a motel and then go to the [place].   The patient mobilized to EOB mod I, transferred sit <> stand with supervision. Reached for support for initial standing balance, some unsteadiness noted. Pt ambulated ~155ft with RW and CGA. Pt slightly impulsive, needed verbal cues to decrease speed to improve balance. 1 instance of imbalance, minAx1 from PT to correct, improvement noted with use of RW and proper activity pacing. The patient demonstrated limitations that impede pt's safety and mobility and would benefit from further skilled PT intervention to address these. Recommendation is HHPT with supervision for OOB/mobility for safety.    Follow Up Recommendations Home health PT;Supervision for mobility/OOB    Equipment Recommendations  Rolling walker with 5" wheels    Recommendations for Other Services       Precautions / Restrictions Precautions Precautions: Fall Restrictions Weight Bearing Restrictions: No      Mobility  Bed Mobility Overal bed mobility: Modified Independent                Transfers Overall transfer level: Needs assistance   Transfers: Sit to/from Stand Sit to Stand: Supervision            Ambulation/Gait Ambulation/Gait assistance: Min guard Gait Distance (Feet): 180 Feet Assistive  device: Rolling walker (2 wheeled)       General Gait Details: Pt slightly impulsive, needed verbal cues to decrease speed to improve balance. 1 instance of imbalance, minAx1 from PT to correct, improvement noted with use of RW and proper activity pacing.   Stairs            Wheelchair Mobility    Modified Rankin (Stroke Patients Only)       Balance Overall balance assessment: Needs assistance Sitting-balance support: Feet supported;No upper extremity supported Sitting balance-Leahy Scale: Good       Standing balance-Leahy Scale: Fair                               Pertinent Vitals/Pain Pain Assessment: Faces Faces Pain Scale: No hurt    Home Living Family/patient expects to be discharged to:: Shelter/Homeless                 Additional Comments: Pt previously reported living with his brother, does not want to return/discharge to that location. Stated he plans on getting a motel and then moving to the [place].     Prior Function Level of Independence: Independent         Comments: Stated he has been falling a lot, but that it could be due to ETOH abuse.     Hand Dominance        Extremity/Trunk Assessment   Upper Extremity Assessment Upper Extremity Assessment: Overall WFL for tasks assessed    Lower Extremity Assessment Lower Extremity Assessment: Generalized weakness  Communication   Communication: HOH  Cognition Arousal/Alertness: Awake/alert Behavior During Therapy: WFL for tasks assessed/performed Overall Cognitive Status: Within Functional Limits for tasks assessed                                 General Comments: oriented to self, situation, place, disoriented to time      General Comments      Exercises     Assessment/Plan    PT Assessment Patient needs continued PT services  PT Problem List Decreased strength;Decreased balance;Decreased mobility;Decreased knowledge of use of DME       PT  Treatment Interventions Therapeutic exercise;DME instruction;Gait training;Balance training;Stair training;Neuromuscular re-education;Functional mobility training;Therapeutic activities;Patient/family education    PT Goals (Current goals can be found in the Care Plan section)  Acute Rehab PT Goals Patient Stated Goal: Pt wants to get stronger PT Goal Formulation: With patient Time For Goal Achievement: 04/09/18 Potential to Achieve Goals: Good    Frequency Min 2X/week   Barriers to discharge        Co-evaluation               AM-PAC PT "6 Clicks" Mobility  Outcome Measure Help needed turning from your back to your side while in a flat bed without using bedrails?: None Help needed moving from lying on your back to sitting on the side of a flat bed without using bedrails?: None Help needed moving to and from a bed to a chair (including a wheelchair)?: A Little Help needed standing up from a chair using your arms (e.g., wheelchair or bedside chair)?: None Help needed to walk in hospital room?: A Little Help needed climbing 3-5 steps with a railing? : A Little 6 Click Score: 21    End of Session Equipment Utilized During Treatment: Gait belt Activity Tolerance: Patient tolerated treatment well Patient left: with chair alarm set;in chair;with call bell/phone within reach Nurse Communication: Mobility status PT Visit Diagnosis: Unsteadiness on feet (R26.81);Other abnormalities of gait and mobility (R26.89);Muscle weakness (generalized) (M62.81)    Time: 3254-9826 PT Time Calculation (min) (ACUTE ONLY): 18 min   Charges:   PT Evaluation $PT Eval Low Complexity: 1 Low         Lieutenant Diego PT, DPT 11:52 AM,03/26/18 [phone]

## 2018-03-26 NOTE — Progress Notes (Signed)
*  PRELIMINARY RESULTS* Echocardiogram 2D Echocardiogram has been performed.  Adam Good 03/26/2018, 9:18 AM

## 2018-03-26 NOTE — Care Management Note (Signed)
Case Management Note  Patient Details  Name: Adam Good MRN: [id number] Date of Birth: 1945-07-15  Subjective/Objective:    Patient has been staying with his brother in [place].  He will be discharging today.  He states he and his brother are no long speaking.  He needs to go to his brothers house and get his things.  MD ordered home health services.  Patient has declined; home health would not be covered with his diagnoses with the new PDGM guidelines.  Requested a walker from [facility].  Patient plans to pay for a cab when discharged.  He is current with the [facility].  No further needs at this time.               Action/Plan:   Expected Discharge Date:  03/26/18               Expected Discharge Plan:  Home/Self Care  In-House Referral:     Discharge planning Services  CM Consult  Post Acute Care Choice:    Choice offered to:     DME Arranged:    DME Agency:     HH Arranged:    HH Agency:     Status of Service:  Completed, signed off  If discussed at [employer] of Stay Meetings, dates discussed:    Additional Comments:  Elza Rafter, RN 03/26/2018, 12:36 PM

## 2018-05-28 ENCOUNTER — Emergency Department
Admission: EM | Admit: 2018-05-28 | Discharge: 2018-05-29 | Disposition: A | Discharge status: Home / Self Care | Payer: Medicare Other | Attending: Emergency Medicine | Admitting: Emergency Medicine

## 2018-05-28 ENCOUNTER — Other Ambulatory Visit: Payer: Self-pay

## 2018-05-28 ENCOUNTER — Encounter: Payer: Self-pay | Admitting: Emergency Medicine

## 2018-05-28 DIAGNOSIS — Z8673 Personal history of transient ischemic attack (TIA), and cerebral infarction without residual deficits: Secondary | ICD-10-CM | POA: Insufficient documentation

## 2018-05-28 DIAGNOSIS — F17210 Nicotine dependence, cigarettes, uncomplicated: Secondary | ICD-10-CM | POA: Diagnosis not present

## 2018-05-28 DIAGNOSIS — Y908 Blood alcohol level of 240 mg/100 ml or more: Secondary | ICD-10-CM | POA: Diagnosis not present

## 2018-05-28 DIAGNOSIS — Z7982 Long term (current) use of aspirin: Secondary | ICD-10-CM | POA: Insufficient documentation

## 2018-05-28 DIAGNOSIS — I10 Essential (primary) hypertension: Secondary | ICD-10-CM | POA: Diagnosis not present

## 2018-05-28 DIAGNOSIS — F1010 Alcohol abuse, uncomplicated: Secondary | ICD-10-CM | POA: Diagnosis present

## 2018-05-28 DIAGNOSIS — Z79899 Other long term (current) drug therapy: Secondary | ICD-10-CM | POA: Diagnosis not present

## 2018-05-28 LAB — CBC
HCT: 46.3 % (ref 39.0–52.0)
HEMOGLOBIN: 15.3 g/dL (ref 13.0–17.0)
MCH: 32.4 pg (ref 26.0–34.0)
MCHC: 33 g/dL (ref 30.0–36.0)
MCV: 98.1 fL (ref 80.0–100.0)
Platelets: 204 10*3/uL (ref 150–400)
RBC: 4.72 MIL/uL (ref 4.22–5.81)
RDW: 14.1 % (ref 11.5–15.5)
WBC: 7.8 10*3/uL (ref 4.0–10.5)
nRBC: 0 % (ref 0.0–0.2)

## 2018-05-28 LAB — COMPREHENSIVE METABOLIC PANEL
ALT: 24 U/L (ref 0–44)
AST: 38 U/L (ref 15–41)
Albumin: 4.4 g/dL (ref 3.5–5.0)
Alkaline Phosphatase: 65 U/L (ref 38–126)
Anion gap: 10 (ref 5–15)
BUN: 11 mg/dL (ref 8–23)
CO2: 26 mmol/L (ref 22–32)
Calcium: 8.4 mg/dL — ABNORMAL LOW (ref 8.9–10.3)
Chloride: 103 mmol/L (ref 98–111)
Creatinine, Ser: 0.81 mg/dL (ref 0.61–1.24)
GFR calc Af Amer: >60 mL/min (ref >60)
GFR calc non Af Amer: >60 mL/min (ref >60)
Glucose, Bld: 98 mg/dL (ref 70–99)
Potassium: 3.8 mmol/L (ref 3.5–5.1)
Sodium: 139 mmol/L (ref 135–145)
Total Bilirubin: 0.6 mg/dL (ref 0.3–1.2)
Total Protein: 7.5 g/dL (ref 6.5–8.1)

## 2018-05-28 LAB — URINE DRUG SCREEN, QUALITATIVE (ARMC ONLY)
Amphetamines, Ur Screen: NOT DETECTED
Barbiturates, Ur Screen: NOT DETECTED
Benzodiazepine, Ur Scrn: NOT DETECTED
Cannabinoid 50 Ng, Ur ~~LOC~~: NOT DETECTED
Cocaine Metabolite,Ur ~~LOC~~: NOT DETECTED
MDMA (Ecstasy)Ur Screen: NOT DETECTED
METHADONE SCREEN, URINE: NOT DETECTED
Opiate, Ur Screen: NOT DETECTED
Phencyclidine (PCP) Ur S: NOT DETECTED
Tricyclic, Ur Screen: NOT DETECTED

## 2018-05-28 LAB — ETHANOL: Alcohol, Ethyl (B): 283 mg/dL — ABNORMAL HIGH (ref <10)

## 2018-05-28 NOTE — ED Triage Notes (Signed)
Pt to ED via [facility] for detox. Pt states that his last drink was today. Pt states that he drinks every day for the last 40 years.

## 2018-05-28 NOTE — ED Provider Notes (Signed)
[facility] Emergency Department Provider Note   ____________________________________________    I have reviewed the triage vital signs and the nursing notes.   HISTORY  Chief Complaint detox     HPI Adam Good is a 73 y.o. male who presents with request for help with detox from alcohol.  Patient reports long history of alcohol abuse, he drinks daily.  He would like help stopping.  He does report that if he stops drinking alcohol he develops tremors and nausea.  Has no physical complaints at this time.  Past Medical History:  Diagnosis Date  . Alcohol abuse   . Aortic stenosis   . Bipolar disorder ([place])   . Demand ischemia of myocardium ([place]) 01/2014   a. positive troponins in the setting of a fall during intoxication; b. Myoview 2/16: EF 65-70%, no evi of ischemia or infarct. No RWMA.; c. MV 9/16: mild ischemia in the mid anterolateral, mid anterior, basal anterolateral and basal anterior segments, patietn declined LHC  . Diastolic dysfunction    a. TTE 9/16: EF of 50-55%, Gr2DD, severely dilated LA, and moderate AS; b. TTE 3/19: EF of 50-55%, Gr1DD, mildly dilated left atrium, mildly calcified mitral leaflets, mild AS, mild TR, trace PR, RVSP 46 mmHg, trivial pericardial effusion  . Dyslipidemia   . Essential hypertension   . Gastroesophageal reflux disease   . HLD (hyperlipidemia)   . Sleep apnea   . Stroke [facility])     Patient Active Problem List   Diagnosis Date Noted  . Alcohol abuse with uncomplicated intoxication ([place]) 03/25/2018  . Elevated troponin 10/29/2017  . Dyslipidemia   . Essential hypertension   . Alcohol abuse   . Heart murmur   . Demand ischemia of myocardium ([place]) 01/17/2014    Past Surgical History:  Procedure Laterality Date  . HERNIA REPAIR    . KNEE SURGERY    . NM MYOVIEW LTD  Jan 2009; 05/12/14   a) No ischemia/infarct. Normal EF - 73%; b) EF 65-70%, no evidence of ischemia or infarction. No RWMA.     Prior to Admission medications   Medication Sig Start Date End Date Taking? Authorizing Provider  albuterol (PROVENTIL HFA;VENTOLIN HFA) 108 (90 Base) MCG/ACT inhaler Inhale 1-2 puffs into the lungs every 6 (six) hours as needed for wheezing or shortness of breath.    [provider]  amLODipine (NORVASC) 5 MG tablet Take 5 mg by mouth daily.    [provider]  aspirin 81 MG chewable tablet Chew 81 mg by mouth daily.    [provider]  carvedilol (COREG) 3.125 MG tablet Take 3.125 mg by mouth 2 (two) times daily.    [provider]  lisinopril (PRINIVIL,ZESTRIL) 20 MG tablet Take 20 mg by mouth daily.    [provider]  rosuvastatin (CRESTOR) 20 MG tablet Take 20 mg by mouth at bedtime.    [provider]  thiamine 100 MG tablet Take 1 tablet (100 mg total) by mouth daily. 03/27/18   Bettey Costa, MD     Allergies Patient has no known allergies.  Family History  Problem Relation Age of Onset  . Throat cancer Mother   . Cancer Father   . CAD Sister   . Hypertension Sister   . Diabetes Brother     Social History Social History   Tobacco Use  . Smoking status: Current Every Day Smoker    Packs/day: 0.50    Years: 57.00    Pack  years: 28.50    Types: Cigarettes  . Smokeless tobacco: Current User    Types: Snuff  Substance Use Topics  . Alcohol use: Yes    Alcohol/week: 0.0 standard drinks    Comment: He has been a heavy alcohol drinker for > 30 yrs.  Stats that he quit 3 weeks ago.   . Drug use: No    Review of Systems  Constitutional: No fever/chills Eyes: No visual changes.  ENT: No sore throat. Cardiovascular: Denies chest pain. Respiratory: Denies shortness of breath. Gastrointestinal: No abdominal pain.  No nausea, no vomiting.   Genitourinary: Negative for dysuria. Musculoskeletal: Negative for back pain. Skin: Negative for rash. Neurological: Negative for headaches     ____________________________________________   PHYSICAL EXAM:  VITAL SIGNS: ED Triage Vitals  Enc Vitals Group     BP 05/28/18 1043 (!) 160/79     Pulse Rate 05/28/18 1043 72     Resp 05/28/18 1043 16     Temp 05/28/18 1043 (!) 97.5 F (36.4 C)     Temp Source 05/28/18 1043 Oral     SpO2 05/28/18 1043 98 %     Weight --      Height --      Head Circumference --      Peak Flow --      Pain Score 05/28/18 1044 0     Pain Loc --      Pain Edu? --      Excl. in [place]? --     Constitutional: Alert and oriented. Eyes: Conjunctivae are normal.   Nose: No congestion/rhinnorhea. Mouth/Throat: Mucous membranes are moist.    Cardiovascular: Normal rate, regular rhythm. Grossly normal heart sounds.  Good peripheral circulation. Respiratory: Normal respiratory effort.  No retractions. Lungs CTAB. Gastrointestinal: Soft and nontender. No distention.   Musculoskeletal:   Warm and well perfused Neurologic:  Normal speech and language. No gross focal neurologic deficits are appreciated.  Skin:  Skin is warm, dry and intact. No rash noted. Psychiatric: Mood and affect are normal. Speech and behavior are normal.  ____________________________________________   LABS (all labs ordered are listed, but only abnormal results are displayed)  Labs Reviewed  COMPREHENSIVE METABOLIC PANEL - Abnormal; Notable for the following components:      Result Value   Calcium 8.4 (*)    All other components within normal limits  ETHANOL - Abnormal; Notable for the following components:   Alcohol, Ethyl (B) 283 (*)    All other components within normal limits  CBC  URINE DRUG SCREEN, QUALITATIVE ([facility] ONLY)   ____________________________________________  EKG  None ____________________________________________  RADIOLOGY  None ____________________________________________   PROCEDURES  Procedure(s) performed: No  Procedures   Critical Care performed: No  ____________________________________________   INITIAL IMPRESSION / ASSESSMENT AND PLAN / ED COURSE  Pertinent labs & imaging results that were available during my care of the patient were reviewed by me and considered in my medical decision making (see chart for details).  Patient well-appearing and in no acute distress.  Lab work is significant only for an elevated alcohol level.  From my standpoint he is medically cleared, have consulted TTS to see if we can help him with detox    ____________________________________________   FINAL CLINICAL IMPRESSION(S) / ED DIAGNOSES  Final diagnoses:  Alcohol abuse        Note:  This document was prepared using Dragon voice recognition software and may include unintentional dictation errors.   Lavonia Drafts, MD 05/28/18 [phone]

## 2018-05-28 NOTE — ED Notes (Addendum)
First Nurse Note: Report received from EMS, complaint of alcohol abuse and requesting detox.  Patient is also reportedly homeless.  Picked up at [facility] in [place]. Patient sitting in WC.

## 2018-05-28 NOTE — ED Notes (Signed)
Pt aroused to give urine sample. Ambulated to bathroom without difficulty. Maintained on 15 minute checks and observation by security for safety.

## 2018-05-28 NOTE — BH Assessment (Addendum)
Prior to requesting detox bed availability the following information would need to completed:  - EDP note (medical clearance) - Labs (urine drug screen & ethanol) - Vitals   This information was relayed to pt's nurse.

## 2018-05-28 NOTE — BH Assessment (Signed)
Referral information for Detox Placement have been faxed to;    [facility] Office     [facility]     [facility]     [facility]     [place]     [facility]     [place]     [facility]     [facility]

## 2018-05-28 NOTE — ED Notes (Signed)
Pt  vol

## 2018-05-28 NOTE — ED Notes (Signed)
Pt sleeping at this time. CIWA score 0.

## 2018-05-29 LAB — ETHANOL: Alcohol, Ethyl (B): <10 mg/dL (ref <10)

## 2018-05-29 MED ORDER — LORAZEPAM 1 MG PO TABS
ORAL_TABLET | ORAL | Status: AC
  Filled 2018-05-29: qty 2

## 2018-05-29 NOTE — ED Notes (Signed)
ED  Is the patient under IVC or is there intent for IVC: No   Is the patient medically cleared: Yes.   Is there vacancy in the ED BHU: Yes.   Is the population mix appropriate for patient:  No - pt is a fall risk r/t his intoxication   Is the patient awaiting placement in inpatient or outpatient setting: Yes.  Pt awaiting detox placement   Has the patient had a psychiatric consult:  Not ordered   Survey of unit performed for contraband, proper placement and condition of furniture, tampering with fixtures in bathroom, shower, and each patient room: Yes.  ; Findings:  APPEARANCE/BEHAVIOR Calm and cooperative NEURO ASSESSMENT Orientation: oriented x3  Denies pain Hallucinations: No.None noted (Hallucinations)  Denies  Speech: Normal Gait: normal  - unsteady at times  RESPIRATORY ASSESSMENT Even  Unlabored respirations  CARDIOVASCULAR ASSESSMENT Pulses equal   regular rate  Skin warm and dry   GASTROINTESTINAL ASSESSMENT no GI complaint EXTREMITIES Full ROM  PLAN OF CARE Provide calm/safe environment. Vital signs assessed twice daily. ED BHU Assessment once each 12-hour shift. Collaborate with TTS daily or as condition indicates. Assure the ED provider has rounded once each shift. Provide and encourage hygiene. Provide redirection as needed. Assess for escalating behavior; address immediately and inform ED provider.  Assess family dynamic and appropriateness for visitation as needed: Yes.  ; If necessary, describe findings:  Educate the patient/family about BHU procedures/visitation: Yes.  ; If necessary, describe findings:

## 2018-05-29 NOTE — ED Notes (Signed)
BEHAVIORAL HEALTH ROUNDING Patient sleeping: No. Patient alert and oriented: yes Behavior appropriate: Yes.  ; If no, describe:  Nutrition and fluids offered: yes Toileting and hygiene offered: Yes  Sitter present: q15 minute observations and security  monitoring Law enforcement present: Yes  ODS  

## 2018-05-29 NOTE — TOC Initial Note (Signed)
Transition of Care [facility]) - Initial/Assessment Note    Patient Details  Name: Adam Good MRN: [id number] Date of Birth: [birth year]  Transition of Care [facility]) CM/SW Contact:    Shelbie Hutching, RN Phone Number: 05/29/2018, 5:14 PM  Clinical Narrative:                 Patient is homeless, he is aware that there is a shelter in [facility] he has stayed there before but it is not a long term solution.  Patient reports that he also stays at the [facility] when he has money to stay there.  He get monthly checks one from the [place]- he reports he spends it on beer.  Patient has a brother that provides transportation at times but he cannot stay with his brother they do not get along.  Patient is followed by the [facility].  RNCM reached out to the [place] but they are on diversion and he has no Education officer, museum listed.  RNCM suggested to patient to get up to the [place] and see his doctor for help.  Patient reports that he wants to quit drinking.  Booklet of [facility] resources given to patient along with a Link bus pass and Link transit schedule and route information.  Patient medically cleared from the ED and discharged.   Expected Discharge Plan: Homeless Shelter(homeless- knows where the shelter is)     Patient Goals and CMS Choice        Expected Discharge Plan and Services Expected Discharge Plan: Homeless Shelter(homeless- knows where the shelter is)                                Prior Living Arrangements/Services                       Activities of Daily Living      Permission Sought/Granted                  Emotional Assessment Appearance:: Appears stated age, Disheveled Attitude/Demeanor/Rapport: Engaged Affect (typically observed): Accepting Orientation: : Oriented to Self, Oriented to Place, Oriented to  Time, Oriented to Situation Alcohol / Substance Use: Alcohol Use Psych Involvement: Yes (comment)  Admission diagnosis:  detox Patient Active  Problem List   Diagnosis Date Noted  . Alcohol abuse with uncomplicated intoxication ([place]) 03/25/2018  . Elevated troponin 10/29/2017  . Dyslipidemia   . Essential hypertension   . Alcohol abuse   . Heart murmur   . Demand ischemia of myocardium ([place]) 01/17/2014   [facility]P:  [facility] [place], [place] - [place] [address] Phone: [phone] Fax: [phone]     Social Determinants of Health (SDOH) Interventions    Readmission Risk Interventions  No flowsheet data found.

## 2018-05-29 NOTE — ED Notes (Signed)
Patient observed lying in the hallway bed with eyes closed  Even, unlabored respirations observed   NAD pt appears to be sleeping  I will continue to monitor along with every 15 minute visual observations and ongoing security monitoring

## 2018-05-29 NOTE — ED Provider Notes (Signed)
_________________________ 5:34 PM on 05/29/2018 -----------------------------------------  TTS unable to place patient in detox facility since patient's alcohol level is 0 and he has been here for greater than 24 hours without withdrawing symptoms.  His brother refused to take him home with him.  Social work was consulted who was able to get patient to a shelter this evening.  He was provided outpatient detox information.   Alfred Levins, [place], MD 05/29/18 [phone]

## 2018-05-29 NOTE — ED Notes (Signed)
BEHAVIORAL HEALTH ROUNDING Patient sleeping: Yes.   Patient alert and oriented: eyes closed  Appears asleep Behavior appropriate: Yes.  ; If no, describe:  Nutrition and fluids offered: Yes  Toileting and hygiene offered: sleeping Sitter present: q 15 minute observations and securit monitoring Law enforcement present: yes  ODS  ENVIRONMENTAL ASSESSMENT Potentially harmful objects out of patient reach: Yes.   Personal belongings secured: Yes.   Patient dressed in hospital provided attire only: Yes.   Plastic bags out of patient reach: Yes.   Patient care equipment (cords, cables, call bells, lines, and drains) shortened, removed, or accounted for: Yes.   Equipment and supplies removed from bottom of stretcher: Yes.   Potentially toxic materials out of patient reach: Yes.   Sharps container removed or out of patient reach: Yes.

## 2018-05-29 NOTE — ED Notes (Signed)
Brother is visiting at his bedside

## 2018-05-29 NOTE — Clinical Social Work Note (Signed)
CSW received phone call from Surveyor, quantity of behavioral health she was asking for bus passes for patient.  CSW provided her with bus pass for patient.  Behavioral Health provided patient with contact information for other treatment facilities.  CSW signing off, please reconsult if other social work needs arise.  Evette Cristal, MSW, LCSW ED Covering CSW [phone] 05/29/2018 4:55 PM

## 2018-05-29 NOTE — BH Assessment (Signed)
TTS talked with patient and his brother, Kathrin Penner at bedside. TTS explained that the patient is under review with [facility]; however, he may not meet criteria for inpatient treatment but may meet criteria for a short treatment support stay.  TTS explained "detox" admissions may be difficult as the patient has an undetectable alcohol level at this time as well as limited withdrawal symptoms.   Bill shared that the patient has multiple episodes of wandering off and getting drunk after having several days of sobriety. Bill reported that the patient was once found unresponsive at a bus station in [place] with a low body temperature and was admitted to a hospital for ten days with low prognosis. Bill stated the patient seems to be experiencing dementia; however, that cannot be verified at this time.

## 2018-05-29 NOTE — BH Assessment (Signed)
At this time, [name] is alert and oriented and denies SI or HI.  He denies any known mental health conditions.  He stated "I've been drinking for over 40 years, and I think it's time for me to get off of it." The patient recently received his breakfast tray and is jittery. Asked what he wants to happen next, he states "I want to go wherever I can get help."   TTS agreed to continue pursuing placement options.

## 2018-05-29 NOTE — BH Assessment (Signed)
TTS Telephoned [facility] to inquire about placement. Patient is under review.  [facility] requested updated clinical information.    Information was faxed as requested, via EPIC:    [facility]  [place], [place] [address]    Phone: [phone]    Fax: [phone]

## 2018-05-29 NOTE — BH Assessment (Signed)
[facility] is stating [facility] only have male beds at this time.

## 2018-05-29 NOTE — BH Assessment (Signed)
Patient was denied for [facility] due to not meeting criteria.    Patient was provided with contact information for multiple treatment facilities for walk-in or call-in assessment.    Patient was informed that he was not accepted at [facility] and to anticipate support from social work in terms of housing/placement.

## 2018-07-06 ENCOUNTER — Emergency Department: Payer: Medicare Other

## 2018-07-06 ENCOUNTER — Emergency Department
Admission: EM | Admit: 2018-07-06 | Discharge: 2018-07-06 | Disposition: A | Discharge status: Home / Self Care | Payer: Medicare Other | Attending: Emergency Medicine | Admitting: Emergency Medicine

## 2018-07-06 ENCOUNTER — Other Ambulatory Visit: Payer: Self-pay

## 2018-07-06 DIAGNOSIS — R079 Chest pain, unspecified: Secondary | ICD-10-CM | POA: Diagnosis present

## 2018-07-06 DIAGNOSIS — I5030 Unspecified diastolic (congestive) heart failure: Secondary | ICD-10-CM | POA: Diagnosis not present

## 2018-07-06 DIAGNOSIS — F17210 Nicotine dependence, cigarettes, uncomplicated: Secondary | ICD-10-CM | POA: Insufficient documentation

## 2018-07-06 DIAGNOSIS — Z79899 Other long term (current) drug therapy: Secondary | ICD-10-CM | POA: Insufficient documentation

## 2018-07-06 DIAGNOSIS — F1010 Alcohol abuse, uncomplicated: Secondary | ICD-10-CM | POA: Insufficient documentation

## 2018-07-06 DIAGNOSIS — Z590 Homelessness: Secondary | ICD-10-CM | POA: Diagnosis not present

## 2018-07-06 DIAGNOSIS — Z8673 Personal history of transient ischemic attack (TIA), and cerebral infarction without residual deficits: Secondary | ICD-10-CM | POA: Diagnosis not present

## 2018-07-06 DIAGNOSIS — Y908 Blood alcohol level of 240 mg/100 ml or more: Secondary | ICD-10-CM | POA: Diagnosis not present

## 2018-07-06 DIAGNOSIS — I110 Hypertensive heart disease with heart failure: Secondary | ICD-10-CM | POA: Diagnosis not present

## 2018-07-06 DIAGNOSIS — K2920 Alcoholic gastritis without bleeding: Secondary | ICD-10-CM

## 2018-07-06 LAB — COMPREHENSIVE METABOLIC PANEL
ALT: 25 U/L (ref 0–44)
AST: 51 U/L — ABNORMAL HIGH (ref 15–41)
Albumin: 4 g/dL (ref 3.5–5.0)
Alkaline Phosphatase: 75 U/L (ref 38–126)
Anion gap: 16 — ABNORMAL HIGH (ref 5–15)
BUN: 11 mg/dL (ref 8–23)
CO2: 25 mmol/L (ref 22–32)
Calcium: 8.5 mg/dL — ABNORMAL LOW (ref 8.9–10.3)
Chloride: 94 mmol/L — ABNORMAL LOW (ref 98–111)
Creatinine, Ser: 1.1 mg/dL (ref 0.61–1.24)
GFR calc Af Amer: >60 mL/min (ref >60)
GFR calc non Af Amer: >60 mL/min (ref >60)
Glucose, Bld: 111 mg/dL — ABNORMAL HIGH (ref 70–99)
Potassium: 3.2 mmol/L — ABNORMAL LOW (ref 3.5–5.1)
Sodium: 135 mmol/L (ref 135–145)
Total Bilirubin: 0.7 mg/dL (ref 0.3–1.2)
Total Protein: 6.5 g/dL (ref 6.5–8.1)

## 2018-07-06 LAB — CBC WITH DIFFERENTIAL/PLATELET
Abs Immature Granulocytes: 0.01 10*3/uL (ref 0.00–0.07)
Basophils Absolute: 0.1 10*3/uL (ref 0.0–0.1)
Basophils Relative: 1 %
Eosinophils Absolute: 0.1 10*3/uL (ref 0.0–0.5)
Eosinophils Relative: 1 %
HCT: 39.7 % (ref 39.0–52.0)
Hemoglobin: 13.4 g/dL (ref 13.0–17.0)
Immature Granulocytes: 0 %
Lymphocytes Relative: 44 %
Lymphs Abs: 2.8 10*3/uL (ref 0.7–4.0)
MCH: 33.1 pg (ref 26.0–34.0)
MCHC: 33.8 g/dL (ref 30.0–36.0)
MCV: 98 fL (ref 80.0–100.0)
Monocytes Absolute: 0.7 10*3/uL (ref 0.1–1.0)
Monocytes Relative: 11 %
Neutro Abs: 2.7 10*3/uL (ref 1.7–7.7)
Neutrophils Relative %: 43 %
Platelets: 168 10*3/uL (ref 150–400)
RBC: 4.05 MIL/uL — ABNORMAL LOW (ref 4.22–5.81)
RDW: 13.1 % (ref 11.5–15.5)
WBC: 6.4 10*3/uL (ref 4.0–10.5)
nRBC: 0 % (ref 0.0–0.2)

## 2018-07-06 LAB — TROPONIN I
Troponin I: 0.03 ng/mL (ref <0.03)
Troponin I: 0.03 ng/mL (ref <0.03)

## 2018-07-06 LAB — LIPASE, BLOOD: Lipase: 33 U/L (ref 11–51)

## 2018-07-06 LAB — ETHANOL: Alcohol, Ethyl (B): 250 mg/dL — ABNORMAL HIGH (ref <10)

## 2018-07-06 MED ORDER — SODIUM CHLORIDE 0.9 % IV BOLUS
1000.0000 mL | Freq: Once | INTRAVENOUS | Status: AC
  Administered 2018-07-06: 18:00:00 1000 mL via INTRAVENOUS

## 2018-07-06 MED ORDER — ALUM & MAG HYDROXIDE-SIMETH 200-200-20 MG/5ML PO SUSP
30.0000 mL | Freq: Once | ORAL | Status: AC
  Administered 2018-07-06: 17:00:00 30 mL via ORAL
  Filled 2018-07-06: qty 30

## 2018-07-06 MED ORDER — LIDOCAINE VISCOUS HCL 2 % MT SOLN
15.0000 mL | Freq: Once | OROMUCOSAL | Status: AC
  Administered 2018-07-06: 17:00:00 15 mL via ORAL
  Filled 2018-07-06: qty 15

## 2018-07-06 MED ORDER — FAMOTIDINE IN NACL 20-0.9 MG/50ML-% IV SOLN
20.0000 mg | Freq: Once | INTRAVENOUS | Status: AC
  Administered 2018-07-06: 20 mg via INTRAVENOUS
  Filled 2018-07-06: qty 50

## 2018-07-06 NOTE — ED Notes (Signed)
Pt was resting comfortably upon this RN entering room. Pt given meal tray and water.

## 2018-07-06 NOTE — ED Notes (Signed)
Pt states that he has no one to call to take him home and that he will "walk back to [place]"

## 2018-07-06 NOTE — ED Provider Notes (Signed)
[facility] Emergency Department Provider Note  ____________________________________________  Time seen: Approximately 5:19 PM  I have reviewed the triage vital signs and the nursing notes.   HISTORY  Chief Complaint Chest Pain   HPI Adam Good is a 73 y.o. male with a history of alcohol abuse, homelessness, bipolar disorder, diastolic CHF, hypertension, GERD, stroke who presents for evaluation of chest pain.  Patient reports 3 days of constant burning chest pain associated with shortness of breath.  Went to the police station today and made this complaint which made them call 911.  No fever or chills, no cough, no abdominal pain, no nausea, no vomiting, no diarrhea.  No prior history of heart attacks.  Patient is a former smoker and currently chews tobacco.  He is a heavy drinker and continues to drink with the last alcohol just prior to arrival.  No personal or family history of blood clots, recent travel immobilization, leg pain or swelling, hemoptysis, or exogenous hormones.  Patient received 1 spray of nitroglycerin and 325 mg of aspirin in route.  Past Medical History:  Diagnosis Date  . Alcohol abuse   . Aortic stenosis   . Bipolar disorder ([place])   . Demand ischemia of myocardium ([place]) 01/2014   a. positive troponins in the setting of a fall during intoxication; b. Myoview 2/16: EF 65-70%, no evi of ischemia or infarct. No RWMA.; c. MV 9/16: mild ischemia in the mid anterolateral, mid anterior, basal anterolateral and basal anterior segments, patietn declined LHC  . Diastolic dysfunction    a. TTE 9/16: EF of 50-55%, Gr2DD, severely dilated LA, and moderate AS; b. TTE 3/19: EF of 50-55%, Gr1DD, mildly dilated left atrium, mildly calcified mitral leaflets, mild AS, mild TR, trace PR, RVSP 46 mmHg, trivial pericardial effusion  . Dyslipidemia   . Essential hypertension   . Gastroesophageal reflux disease   . HLD (hyperlipidemia)   . Sleep apnea   .  Stroke [facility])     Patient Active Problem List   Diagnosis Date Noted  . Alcohol abuse with uncomplicated intoxication ([place]) 03/25/2018  . Elevated troponin 10/29/2017  . Dyslipidemia   . Essential hypertension   . Alcohol abuse   . Heart murmur   . Demand ischemia of myocardium ([place]) 01/17/2014    Past Surgical History:  Procedure Laterality Date  . HERNIA REPAIR    . KNEE SURGERY    . NM MYOVIEW LTD  Jan 2009; 05/12/14   a) No ischemia/infarct. Normal EF - 73%; b) EF 65-70%, no evidence of ischemia or infarction. No RWMA.    Prior to Admission medications   Medication Sig Start Date End Date Taking? Authorizing Provider  albuterol (PROVENTIL HFA;VENTOLIN HFA) 108 (90 Base) MCG/ACT inhaler Inhale 1-2 puffs into the lungs every 6 (six) hours as needed for wheezing or shortness of breath.    [provider]  amLODipine (NORVASC) 5 MG tablet Take 5 mg by mouth daily.    [provider]  aspirin 81 MG chewable tablet Chew 81 mg by mouth daily.    [provider]  carvedilol (COREG) 3.125 MG tablet Take 3.125 mg by mouth 2 (two) times daily.    [provider]  lisinopril (PRINIVIL,ZESTRIL) 20 MG tablet Take 20 mg by mouth daily.    [provider]  rosuvastatin (CRESTOR) 20 MG tablet Take 20 mg by mouth at bedtime.    [provider]  thiamine 100 MG tablet Take 1 tablet (100 mg  total) by mouth daily. 03/27/18   Bettey Costa, MD    Allergies Patient has no known allergies.  Family History  Problem Relation Age of Onset  . Throat cancer Mother   . Cancer Father   . CAD Sister   . Hypertension Sister   . Diabetes Brother     Social History Social History   Tobacco Use  . Smoking status: Current Every Day Smoker    Packs/day: 0.50    Years: 57.00    Pack years: 28.50    Types: Cigarettes  . Smokeless tobacco: Current User    Types: Snuff  Substance Use Topics  . Alcohol use: Yes    Alcohol/week: 0.0 standard drinks     Comment: He has been a heavy alcohol drinker for > 30 yrs.  Stats that he quit 3 weeks ago.   . Drug use: No    Review of Systems  Constitutional: Negative for fever. Eyes: Negative for visual changes. ENT: Negative for sore throat. Neck: No neck pain  Cardiovascular: + burning chest pain. Respiratory: + shortness of breath. Gastrointestinal: Negative for abdominal pain, vomiting or diarrhea. Genitourinary: Negative for dysuria. Musculoskeletal: Negative for back pain. Skin: Negative for rash. Neurological: Negative for headaches, weakness or numbness. Psych: No SI or HI  ____________________________________________   PHYSICAL EXAM:  VITAL SIGNS: ED Triage Vitals  Enc Vitals Group     BP 07/06/18 1700 116/79     Pulse Rate 07/06/18 1700 71     Resp 07/06/18 1700 12     Temp 07/06/18 1700 98.5 F (36.9 C)     Temp Source 07/06/18 1700 Oral     SpO2 07/06/18 1700 99 %     Weight 07/06/18 1658 146 lb (66.2 kg)     Height 07/06/18 1658 5\' 8"  (1.727 m)     Head Circumference --      Peak Flow --      Pain Score 07/06/18 1657 8     Pain Loc --      Pain Edu? --      Excl. in [place]? --     Constitutional: Alert and oriented, intoxicated, disheveled. HEENT:      Head: Normocephalic and atraumatic.         Eyes: Conjunctivae are normal. Sclera is non-icteric.       Mouth/Throat: Mucous membranes are moist.       Neck: Supple with no signs of meningismus. Cardiovascular: Regular rate and rhythm. No murmurs, gallops, or rubs. 2+ symmetrical distal pulses are present in all extremities. No JVD. Respiratory: Normal respiratory effort. Lungs are clear to auscultation bilaterally. No wheezes, crackles, or rhonchi.  Gastrointestinal: Soft, non tender, and non distended with positive bowel sounds. No rebound or guarding. Musculoskeletal: Nontender with normal range of motion in all extremities. No edema, cyanosis, or erythema of extremities. Neurologic: Normal speech and language.  Face is symmetric. Moving all extremities. No gross focal neurologic deficits are appreciated. Skin: Skin is warm, dry and intact. No rash noted. Psychiatric: Mood and affect are normal. Speech and behavior are normal.  ____________________________________________   LABS (all labs ordered are listed, but only abnormal results are displayed)  Labs Reviewed  CBC WITH DIFFERENTIAL/PLATELET - Abnormal; Notable for the following components:      Result Value   RBC 4.05 (*)    All other components within normal limits  COMPREHENSIVE METABOLIC PANEL - Abnormal; Notable for the following components:   Potassium 3.2 (*)    Chloride  94 (*)    Glucose, Bld 111 (*)    Calcium 8.5 (*)    AST 51 (*)    Anion gap 16 (*)    All other components within normal limits  TROPONIN I - Abnormal; Notable for the following components:   Troponin I 0.03 (*)    All other components within normal limits  ETHANOL - Abnormal; Notable for the following components:   Alcohol, Ethyl (B) 250 (*)    All other components within normal limits  TROPONIN I - Abnormal; Notable for the following components:   Troponin I 0.03 (*)    All other components within normal limits  LIPASE, BLOOD   ____________________________________________  EKG  ED ECG REPORT I, Rudene Re, the attending physician, personally viewed and interpreted this ECG.  Normal sinus rhythm, rate of 74, prolonged QTC, LVH, T wave inversions in lateral leads with no ST elevation.  Unchanged from prior. ____________________________________________  RADIOLOGY  I have personally reviewed the images performed during this visit and I agree with the Radiologist's read.   Interpretation by Radiologist:  Dg Chest Port 1 View  Result Date: 07/06/2018 CLINICAL DATA:  Chest pain and shortness of breath. EXAM: PORTABLE CHEST 1 VIEW COMPARISON:  03/24/2018 FINDINGS: 1706 hours. Cardiopericardial silhouette is at upper limits of normal for size.  The lungs are clear without focal pneumonia, edema, pneumothorax or pleural effusion. Interstitial markings are diffusely coarsened with chronic features. Old posterior right rib fractures evident. Hiatal hernia noted. Telemetry leads overlie the chest. IMPRESSION: Chronic interstitial changes without acute cardiopulmonary findings. Electronically Signed   By: Misty Stanley M.D.   On: 07/06/2018 17:37      ____________________________________________   PROCEDURES  Procedure(s) performed: None Procedures Critical Care performed:  None ____________________________________________   INITIAL IMPRESSION / ASSESSMENT AND PLAN / ED COURSE   73 y.o. male with a history of alcohol abuse, homelessness, bipolar disorder, diastolic CHF, hypertension, GERD, stroke who presents for evaluation of 3 days of constant burning chest pain and SOB in the setting of EtOH abuse.    Ddx: alcoholic gastritis, alcoholic esophagitis, pancreatitis, PUD, GERD, ACS, PNA. Low suspicion for PE or dissection with normal vitals, no significant levator blood pressure, no neuro deficits, normal pulses x4, no tachycardia, no tachypnea, no hypoxia.  Patient is disheveled and looks intoxicated.  Remainder of his exam is nonfocal.  EKG showing no acute ischemic changes.  Will check labs, CXR, give GI cocktail and IV pepcid. Low suspicion for ACS but will further stratified with troponin x 2. WIll monitor on telemetry.   Clinical Course as of Jul 06 2134  Sun Jul 06, 2018  2134 Labs showing EtOH 250, and no other acute abnormalities. Troponin x 2 unchanged.  Patient remains with no chest pain after GI cocktail and Pepcid.  Most likely alcoholic gastritis.  Will discharge at this time.  Discussed alcohol cessation and follow-up.  Discussed my standard return precautions   [CV]    Clinical Course User Index [CV] Alfred Levins [place], MD     As part of my medical decision making, I reviewed the following data within the  [place] notes reviewed and incorporated, Labs reviewed , EKG interpreted , Old EKG reviewed, Old chart reviewed, Radiograph reviewed , Notes from prior ED visits and [place] Controlled Substance Database    Pertinent labs & imaging results that were available during my care of the patient were reviewed by me and considered in my medical decision making (see  chart for details).    ____________________________________________   FINAL CLINICAL IMPRESSION(S) / ED DIAGNOSES  Final diagnoses:  Acute alcoholic gastritis without hemorrhage      NEW MEDICATIONS STARTED DURING THIS VISIT:  ED Discharge Orders    None       Note:  This document was prepared using Dragon voice recognition software and may include unintentional dictation errors.    Rudene Re, MD 07/06/18 2137

## 2018-07-06 NOTE — ED Notes (Signed)
Pt resting comfortable in NAD. Pt does not report anymore pain but requests some food.

## 2018-07-06 NOTE — ED Notes (Signed)
Pt resting comfortably at this time in NAD.

## 2018-07-06 NOTE — ED Triage Notes (Addendum)
Pt comes from the street via EMS after c/o chest pain for 3 days. Pt reports some SOB that goes with the CP. Ems gave 324mg  aspirin and 1 nitro spray in route. Pt had MI a few months ago. Pt is currently homeless. Pt also reports some drinking today.

## 2018-07-06 NOTE — ED Notes (Signed)
Pt removed own IV and got out of bed to urinate. Pt unable to find toliet and then urinated on the floor. This RN and Theadora Rama, RN to explain to pt to wait on taxi. Pt agreeable to that.

## 2018-08-13 ENCOUNTER — Other Ambulatory Visit: Payer: Self-pay

## 2018-08-13 ENCOUNTER — Emergency Department
Admission: EM | Admit: 2018-08-13 | Discharge: 2018-08-14 | Disposition: A | Discharge status: Home / Self Care | Payer: Medicare Other | Attending: Emergency Medicine | Admitting: Emergency Medicine

## 2018-08-13 DIAGNOSIS — I10 Essential (primary) hypertension: Secondary | ICD-10-CM | POA: Diagnosis not present

## 2018-08-13 DIAGNOSIS — Z20828 Contact with and (suspected) exposure to other viral communicable diseases: Secondary | ICD-10-CM | POA: Insufficient documentation

## 2018-08-13 DIAGNOSIS — Z9119 Patient's noncompliance with other medical treatment and regimen: Secondary | ICD-10-CM | POA: Diagnosis not present

## 2018-08-13 DIAGNOSIS — F329 Major depressive disorder, single episode, unspecified: Secondary | ICD-10-CM | POA: Insufficient documentation

## 2018-08-13 DIAGNOSIS — F17210 Nicotine dependence, cigarettes, uncomplicated: Secondary | ICD-10-CM | POA: Insufficient documentation

## 2018-08-13 DIAGNOSIS — Z8673 Personal history of transient ischemic attack (TIA), and cerebral infarction without residual deficits: Secondary | ICD-10-CM | POA: Diagnosis not present

## 2018-08-13 DIAGNOSIS — Z7982 Long term (current) use of aspirin: Secondary | ICD-10-CM | POA: Insufficient documentation

## 2018-08-13 DIAGNOSIS — Z029 Encounter for administrative examinations, unspecified: Secondary | ICD-10-CM | POA: Diagnosis present

## 2018-08-13 DIAGNOSIS — F1029 Alcohol dependence with unspecified alcohol-induced disorder: Secondary | ICD-10-CM | POA: Insufficient documentation

## 2018-08-13 DIAGNOSIS — Z79899 Other long term (current) drug therapy: Secondary | ICD-10-CM | POA: Insufficient documentation

## 2018-08-13 LAB — COMPREHENSIVE METABOLIC PANEL
ALT: 27 U/L (ref 0–44)
AST: 49 U/L — ABNORMAL HIGH (ref 15–41)
Albumin: 4.7 g/dL (ref 3.5–5.0)
Alkaline Phosphatase: 75 U/L (ref 38–126)
Anion gap: 13 (ref 5–15)
BUN: 17 mg/dL (ref 8–23)
CO2: 26 mmol/L (ref 22–32)
Calcium: 9.1 mg/dL (ref 8.9–10.3)
Chloride: 103 mmol/L (ref 98–111)
Creatinine, Ser: 0.93 mg/dL (ref 0.61–1.24)
GFR calc Af Amer: >60 mL/min (ref >60)
GFR calc non Af Amer: >60 mL/min (ref >60)
Glucose, Bld: 88 mg/dL (ref 70–99)
Potassium: 4.4 mmol/L (ref 3.5–5.1)
Sodium: 142 mmol/L (ref 135–145)
Total Bilirubin: 1 mg/dL (ref 0.3–1.2)
Total Protein: 7.5 g/dL (ref 6.5–8.1)

## 2018-08-13 LAB — CBC
HCT: 42.3 % (ref 39.0–52.0)
Hemoglobin: 14.7 g/dL (ref 13.0–17.0)
MCH: 34.5 pg — ABNORMAL HIGH (ref 26.0–34.0)
MCHC: 34.8 g/dL (ref 30.0–36.0)
MCV: 99.3 fL (ref 80.0–100.0)
Platelets: 128 10*3/uL — ABNORMAL LOW (ref 150–400)
RBC: 4.26 MIL/uL (ref 4.22–5.81)
RDW: 13 % (ref 11.5–15.5)
WBC: 5.1 10*3/uL (ref 4.0–10.5)
nRBC: 0 % (ref 0.0–0.2)

## 2018-08-13 LAB — URINE DRUG SCREEN, QUALITATIVE (ARMC ONLY)
Amphetamines, Ur Screen: NOT DETECTED
Barbiturates, Ur Screen: NOT DETECTED
Benzodiazepine, Ur Scrn: NOT DETECTED
Cannabinoid 50 Ng, Ur ~~LOC~~: NOT DETECTED
Cocaine Metabolite,Ur ~~LOC~~: NOT DETECTED
MDMA (Ecstasy)Ur Screen: NOT DETECTED
Methadone Scn, Ur: NOT DETECTED
Opiate, Ur Screen: NOT DETECTED
Phencyclidine (PCP) Ur S: NOT DETECTED
Tricyclic, Ur Screen: NOT DETECTED

## 2018-08-13 LAB — ETHANOL: Alcohol, Ethyl (B): <10 mg/dL (ref <10)

## 2018-08-13 MED ORDER — AMLODIPINE BESYLATE 5 MG PO TABS
5.0000 mg | ORAL_TABLET | Freq: Every day | ORAL | Status: DC
  Administered 2018-08-13: 5 mg via ORAL
  Filled 2018-08-13: qty 1

## 2018-08-13 MED ORDER — LISINOPRIL 10 MG PO TABS
20.0000 mg | ORAL_TABLET | Freq: Every day | ORAL | Status: DC
  Administered 2018-08-13: 16:00:00 20 mg via ORAL
  Filled 2018-08-13: qty 2

## 2018-08-13 MED ORDER — THIAMINE HCL 100 MG/ML IJ SOLN
Freq: Once | INTRAVENOUS | Status: AC
  Administered 2018-08-13: 17:00:00 via INTRAVENOUS
  Filled 2018-08-13: qty 1000

## 2018-08-13 NOTE — ED Notes (Signed)
Pt had 1 40oz beer this morning at about 830am.

## 2018-08-13 NOTE — BH Assessment (Signed)
Assessment Note  Adam Good is an 73 y.o. male who presents to the ER seeking assistance for his alcohol use. Per the report of the patient, he drinks alcohol on a daily basis, "on and off" for approximately a year. He states he drank, between five to eight forty oz beers a day. Today (08/13/2018), is the only day drank one forty ounce beer. Patient reports of having treatment in the past and when he is discharged, he starts back drinking. He started drinking at the age of 40, "when I went to [place]."  During the interview, the patient was calm, cooperative and pleasant. He was able to provide the appropriate answers to the questions. He denies having history of violence and aggression. He also denies the use of any other mind-altering substances. He denies any involvement with the legal system. Throughout the interview, he denied SI/HI and AV/H.  Diagnosis: Alcohol Use Disorder  Past Medical History:  Past Medical History:  Diagnosis Date  . Alcohol abuse   . Aortic stenosis   . Bipolar disorder ([place])   . Demand ischemia of myocardium ([place]) 01/2014   a. positive troponins in the setting of a fall during intoxication; b. Myoview 2/16: EF 65-70%, no evi of ischemia or infarct. No RWMA.; c. MV 9/16: mild ischemia in the mid anterolateral, mid anterior, basal anterolateral and basal anterior segments, patietn declined LHC  . Diastolic dysfunction    a. TTE 9/16: EF of 50-55%, Gr2DD, severely dilated LA, and moderate AS; b. TTE 3/19: EF of 50-55%, Gr1DD, mildly dilated left atrium, mildly calcified mitral leaflets, mild AS, mild TR, trace PR, RVSP 46 mmHg, trivial pericardial effusion  . Dyslipidemia   . Essential hypertension   . Gastroesophageal reflux disease   . HLD (hyperlipidemia)   . Sleep apnea   . Stroke [facility])     Past Surgical History:  Procedure Laterality Date  . HERNIA REPAIR    . KNEE SURGERY    . NM MYOVIEW LTD  Jan 2009; 05/12/14   a) No ischemia/infarct. Normal  EF - 73%; b) EF 65-70%, no evidence of ischemia or infarction. No RWMA.    Family History:  Family History  Problem Relation Age of Onset  . Throat cancer Mother   . Cancer Father   . CAD Sister   . Hypertension Sister   . Diabetes Brother     Social History:  reports that he has been smoking cigarettes. He has a 28.50 pack-year smoking history. His smokeless tobacco use includes snuff. He reports current alcohol use. He reports that he does not use drugs.  Additional Social History:  Alcohol / Drug Use Pain Medications: See PTA Prescriptions: See PTA Over the Counter: See PTA History of alcohol / drug use?: Yes Longest period of sobriety (when/how long): Seven months Negative Consequences of Use: Personal relationships Withdrawal Symptoms: Nausea / Vomiting Substance #1 Name of Substance 1: Alcohol 1 - Age of First Use: 20 1 - Amount (size/oz): "Eight 40oz's" 1 - Frequency: Daily 1 - Duration: "Since I was 73 years old." 1 - Last Use / Amount: 08/13/2018  CIWA: CIWA-Ar BP: (!) 195/118 Pulse Rate: 78 Nausea and Vomiting: 2 Tactile Disturbances: very mild itching, pins and needles, burning or numbness Tremor: two Auditory Disturbances: not present Paroxysmal Sweats: barely perceptible sweating, palms moist Visual Disturbances: not present Anxiety: mildly anxious Headache, Fullness in Head: none present Agitation: normal activity Orientation and Clouding of Sensorium: oriented and can do serial additions CIWA-Ar Total: 7  COWS:    Allergies: No Known Allergies  Home Medications: (Not in a hospital admission)   OB/GYN Status:  No LMP for male patient.  General Assessment Data Location of Assessment: [facility] ED TTS Assessment: In system Is this a Tele or Face-to-Face Assessment?: Face-to-Face Is this an Initial Assessment or a Re-assessment for this encounter?: Initial Assessment Language Other than English: No Living Arrangements: Homeless/Shelter What gender do  you identify as?: Male Marital status: Widowed Pregnancy Status: No Living Arrangements: Other (Comment)(Homeless) Can pt return to current living arrangement?: No Admission Status: Voluntary Is patient capable of signing voluntary admission?: Yes Referral Source: Self/Family/Friend Insurance type: Medicare  Medical Screening Exam ([place]) Medical Exam completed: Yes  Crisis Care Plan Living Arrangements: Other (Comment)(Homeless) Legal Guardian: Other:(Self) Name of Psychiatrist: Reports of none Name of Therapist: Reports of none  Education Status Is patient currently in school?: No Is the patient employed, unemployed or receiving disability?: Unemployed, Receiving disability income  Risk to self with the past 6 months Suicidal Ideation: No Has patient been a risk to self within the past 6 months prior to admission? : No Suicidal Intent: No Has patient had any suicidal intent within the past 6 months prior to admission? : No Is patient at risk for suicide?: No Suicidal Plan?: No Has patient had any suicidal plan within the past 6 months prior to admission? : No Access to Means: No What has been your use of drugs/alcohol within the last 12 months?: Alcohol Previous Attempts/Gestures: No How many times?: 0 Other Self Harm Risks: Reports of none Triggers for Past Attempts: None known Intentional Self Injurious Behavior: None Family Suicide History: No Recent stressful life event(s): Other (Comment) Persecutory voices/beliefs?: No Depression: Yes Depression Symptoms: Guilt, Loss of interest in usual pleasures, Isolating Substance abuse history and/or treatment for substance abuse?: Yes Suicide prevention information given to non-admitted patients: Not applicable  Risk to Others within the past 6 months Homicidal Ideation: No Does patient have any lifetime risk of violence toward others beyond the six months prior to admission? : No Thoughts of Harm to Others:  No Current Homicidal Intent: No Current Homicidal Plan: No Access to Homicidal Means: No Identified Victim: Reports of none History of harm to others?: No Assessment of Violence: None Noted Violent Behavior Description: Reports of none Does patient have access to weapons?: No Criminal Charges Pending?: No Does patient have a court date: No Is patient on probation?: No  Psychosis Hallucinations: None noted Delusions: None noted  Mental Status Report Appearance/Hygiene: Unremarkable, In scrubs Eye Contact: Fair Motor Activity: Freedom of movement, Unremarkable Speech: Logical/coherent, Unremarkable Level of Consciousness: Alert Mood: Depressed, Sad, Pleasant Affect: Appropriate to circumstance, Depressed Anxiety Level: Minimal Thought Processes: Coherent, Relevant Judgement: Unimpaired Orientation: Person, Place, Time, Situation, Appropriate for developmental age Obsessive Compulsive Thoughts/Behaviors: Minimal  Cognitive Functioning Concentration: Normal Memory: Recent Intact, Remote Intact Is patient IDD: No Insight: Fair Impulse Control: Fair Appetite: Good Have you had any weight changes? : No Change Sleep: No Change Total Hours of Sleep: 8 Vegetative Symptoms: None  ADLScreening [facility] Assessment Services) Patient's cognitive ability adequate to safely complete daily activities?: Yes Patient able to express need for assistance with ADLs?: Yes Independently performs ADLs?: Yes (appropriate for developmental age)  Prior Inpatient Therapy Prior Inpatient Therapy: Yes Prior Therapy Dates: Unable to remember dates Prior Therapy Facilty/Provider(s): [facility], "I don't remember the other places" Reason for Treatment: Alcohol Use Disorder  Prior Outpatient Therapy Prior Outpatient Therapy: No Does patient  have an ACCT team?: No Does patient have Intensive In-House Services?  : No Does patient have [facility] services? : No Does patient have P4CC services?:  No  ADL Screening (condition at time of admission) Patient's cognitive ability adequate to safely complete daily activities?: Yes Is the patient deaf or have difficulty hearing?: No Does the patient have difficulty seeing, even when wearing glasses/contacts?: No Does the patient have difficulty concentrating, remembering, or making decisions?: No Patient able to express need for assistance with ADLs?: Yes Does the patient have difficulty dressing or bathing?: No Independently performs ADLs?: Yes (appropriate for developmental age) Does the patient have difficulty walking or climbing stairs?: No Weakness of Legs: None Weakness of Arms/Hands: None  Home Assistive Devices/Equipment Home Assistive Devices/Equipment: None  Therapy Consults (therapy consults require a physician order) PT Evaluation Needed: No OT Evalulation Needed: No SLP Evaluation Needed: No Abuse/Neglect Assessment (Assessment to be complete while patient is alone) Abuse/Neglect Assessment Can Be Completed: Yes Physical Abuse: Denies Verbal Abuse: Denies Sexual Abuse: Denies Exploitation of patient/patient's resources: Denies Self-Neglect: Denies Values / Beliefs Cultural Requests During Hospitalization: None Spiritual Requests During Hospitalization: None Consults Spiritual Care Consult Needed: No Social Work Consult Needed: No Regulatory affairs officer (For Healthcare) Does Patient Have a Medical Advance Directive?: No Would patient like information on creating a medical advance directive?: No - Patient declined       Child/Adolescent Assessment Running Away Risk: Denies  Disposition:  Disposition Initial Assessment Completed for this Encounter: Yes  On Site Evaluation by:   Reviewed with Physician:    Gunnar Fusi MS, LCAS, [facility], [place], [place] Therapeutic Triage Specialist 08/13/2018 4:25 PM

## 2018-08-13 NOTE — ED Notes (Signed)
Responded to call bell. Pt able to ambulate to commode with some assistance. Pt urinated and provided self hygiene. Pt returned to bed and resting comfortably.

## 2018-08-13 NOTE — ED Triage Notes (Signed)
Pt was sent from [facility] wanting assistance to stop drinking alcohol, states he drink 6-7 40oz beers per day, for the past 40 years.

## 2018-08-13 NOTE — BH Assessment (Signed)
Writer confirmed patient receives services from [facility] [phone] ext [phone])  Referral packet completed, faxed and confirmed it was received ([phone] ext. [phone]). Patient currently pending review.

## 2018-08-13 NOTE — ED Provider Notes (Signed)
[facility] Emergency Department Provider Note   ____________________________________________   First MD Initiated Contact with Patient 08/13/18 1516     (approximate)  I have reviewed the triage vital signs and the nursing notes.   HISTORY  Chief Complaint Addiction Problem    HPI Adam Good is a 73 y.o. male reports he has been drinking since he was in [place] in the 70s.  Been drinking heavily for the last 4 to 6 months.  He wants help getting off of it.  He [place] "[place] the church where that you [place] a [place]" and fell 4 days ago he hit his head.  As I understand it he did not pass out.  He does not have a headache now is not nauseated or vomiting has no blurred vision.  He has not been taking his antihypertensive medications.  His blood pressure is very high.         Past Medical History:  Diagnosis Date  . Alcohol abuse   . Aortic stenosis   . Bipolar disorder ([place])   . Demand ischemia of myocardium ([place]) 01/2014   a. positive troponins in the setting of a fall during intoxication; b. Myoview 2/16: EF 65-70%, no evi of ischemia or infarct. No RWMA.; c. MV 9/16: mild ischemia in the mid anterolateral, mid anterior, basal anterolateral and basal anterior segments, patietn declined LHC  . Diastolic dysfunction    a. TTE 9/16: EF of 50-55%, Gr2DD, severely dilated LA, and moderate AS; b. TTE 3/19: EF of 50-55%, Gr1DD, mildly dilated left atrium, mildly calcified mitral leaflets, mild AS, mild TR, trace PR, RVSP 46 mmHg, trivial pericardial effusion  . Dyslipidemia   . Essential hypertension   . Gastroesophageal reflux disease   . HLD (hyperlipidemia)   . Sleep apnea   . Stroke [facility])     Patient Active Problem List   Diagnosis Date Noted  . Alcohol abuse with uncomplicated intoxication ([place]) 03/25/2018  . Elevated troponin 10/29/2017  . Dyslipidemia   . Essential hypertension   . Alcohol abuse   . Heart murmur   . Demand ischemia of  myocardium ([place]) 01/17/2014    Past Surgical History:  Procedure Laterality Date  . HERNIA REPAIR    . KNEE SURGERY    . NM MYOVIEW LTD  Jan 2009; 05/12/14   a) No ischemia/infarct. Normal EF - 73%; b) EF 65-70%, no evidence of ischemia or infarction. No RWMA.    Prior to Admission medications   Medication Sig Start Date End Date Taking? Authorizing Provider  albuterol (PROVENTIL HFA;VENTOLIN HFA) 108 (90 Base) MCG/ACT inhaler Inhale 1-2 puffs into the lungs every 6 (six) hours as needed for wheezing or shortness of breath.    [provider]  amLODipine (NORVASC) 5 MG tablet Take 5 mg by mouth daily.    [provider]  aspirin 81 MG chewable tablet Chew 81 mg by mouth daily.    [provider]  carvedilol (COREG) 3.125 MG tablet Take 3.125 mg by mouth 2 (two) times daily.    [provider]  lisinopril (PRINIVIL,ZESTRIL) 20 MG tablet Take 20 mg by mouth daily.    [provider]  rosuvastatin (CRESTOR) 20 MG tablet Take 20 mg by mouth at bedtime.    [provider]  thiamine 100 MG tablet Take 1 tablet (100 mg total) by mouth daily. 03/27/18   Bettey Costa, MD    Allergies Patient has no known allergies.  Family History  Problem Relation Age of Onset  . Throat cancer Mother   . Cancer Father   . CAD Sister   . Hypertension Sister   . Diabetes Brother     Social History Social History   Tobacco Use  . Smoking status: Current Every Day Smoker    Packs/day: 0.50    Years: 57.00    Pack years: 28.50    Types: Cigarettes  . Smokeless tobacco: Current User    Types: Snuff  Substance Use Topics  . Alcohol use: Yes    Alcohol/week: 0.0 standard drinks    Comment: He has been a heavy alcohol drinker for > 30 yrs.  Stats that he quit 3 weeks ago.   . Drug use: No    Review of Systems  Constitutional: No fever/chills Eyes: No visual changes. ENT: No sore throat. Cardiovascular: Denies chest pain. Respiratory: Denies  shortness of breath. Gastrointestinal: No abdominal pain.  No nausea, no vomiting.  No diarrhea.  No constipation. Genitourinary: Negative for dysuria. Musculoskeletal: Negative for back pain. Skin: Negative for rash. Neurological: Negative for headaches, focal weakness   ____________________________________________   PHYSICAL EXAM:  VITAL SIGNS: ED Triage Vitals  Enc Vitals Group     BP 08/13/18 1318 (!) 183/151     Pulse Rate 08/13/18 1318 87     Resp 08/13/18 1318 18     Temp 08/13/18 1318 97.8 F (36.6 C)     Temp Source 08/13/18 1318 Oral     SpO2 08/13/18 1318 98 %     Weight 08/13/18 1319 145 lb (65.8 kg)     Height 08/13/18 1319 5\' 8"  (1.727 m)     Head Circumference --      Peak Flow --      Pain Score 08/13/18 1319 5     Pain Loc --      Pain Edu? --      Excl. in [place]? --     Constitutional: Alert and oriented. Well appearing and in no acute distress. Eyes: Conjunctivae are normal. PER. EOMI. Head: Atraumatic except for healing abrasions on the right forehead. Nose: No congestion/rhinnorhea. Mouth/Throat: Mucous membranes are moist.  Oropharynx non-erythematous. Neck: No stridor.   Cardiovascular: Normal rate, regular rhythm. Grossly normal heart sounds.  Good peripheral circulation. Respiratory: Normal respiratory effort.  No retractions. Lungs CTAB. Gastrointestinal: Soft and nontender. No distention. No abdominal bruits. No CVA tenderness. Musculoskeletal: No lower extremity tenderness nor edema.   Neurologic:  Normal speech and language. No gross focal neurologic deficits are appreciated.  Skin:  Skin is warm, dry and intact. No rash noted.  ____________________________________________   LABS (all labs ordered are listed, but only abnormal results are displayed)  Labs Reviewed  COMPREHENSIVE METABOLIC PANEL - Abnormal; Notable for the following components:      Result Value   AST 49 (*)    All other components within normal limits  CBC - Abnormal;  Notable for the following components:   MCH 34.5 (*)    Platelets 128 (*)    All other components within normal limits  ETHANOL  URINE DRUG SCREEN, QUALITATIVE ([facility] ONLY)   ____________________________________________  EKG   ____________________________________________  RADIOLOGY  ED MD interpretation:   Official radiology report(s): No results found.  ____________________________________________   PROCEDURES  Procedure(s) performed (including Critical Care):  Procedures   ____________________________________________   INITIAL IMPRESSION / ASSESSMENT AND PLAN / ED COURSE  Patient wants help getting off of alcohol.  He is a [place] patient.  We will contact the [place].             ____________________________________________   FINAL CLINICAL IMPRESSION(S) / ED DIAGNOSES  Final diagnoses:  Alcohol dependence with unspecified alcohol-induced disorder [facility])     ED Discharge Orders    None       Note:  This document was prepared using Dragon voice recognition software and may include unintentional dictation errors.    Nena Polio, MD 08/13/18 [phone]

## 2018-08-13 NOTE — ED Notes (Signed)
Responded to call bell. Pt requesting food. Dr Cinda Quest notified and approval given. Pt given [place] sandwich tray and apple juice.

## 2018-08-14 LAB — SARS CORONAVIRUS 2 BY RT PCR (HOSPITAL ORDER, PERFORMED IN ~~LOC~~ HOSPITAL LAB): SARS Coronavirus 2: NEGATIVE

## 2018-08-14 MED ORDER — CHLORDIAZEPOXIDE HCL 25 MG PO CAPS
50.0000 mg | ORAL_CAPSULE | Freq: Once | ORAL | Status: AC
  Administered 2018-08-14: 50 mg via ORAL
  Filled 2018-08-14: qty 2

## 2018-08-14 MED ORDER — ASPIRIN 81 MG PO CHEW
81.0000 mg | CHEWABLE_TABLET | Freq: Every day | ORAL | Status: DC
  Administered 2018-08-14: 81 mg via ORAL
  Filled 2018-08-14: qty 1

## 2018-08-14 MED ORDER — ROSUVASTATIN CALCIUM 20 MG PO TABS
20.0000 mg | ORAL_TABLET | Freq: Every day | ORAL | Status: DC
  Filled 2018-08-14: qty 1

## 2018-08-14 MED ORDER — LISINOPRIL 10 MG PO TABS: 20.0000 mg | ORAL_TABLET | Freq: Every day | ORAL | Status: DC

## 2018-08-14 MED ORDER — CARVEDILOL 6.25 MG PO TABS: 3.1250 mg | ORAL_TABLET | Freq: Two times a day (BID) | ORAL | Status: DC

## 2018-08-14 MED ORDER — VITAMIN B-1 100 MG PO TABS
100.0000 mg | ORAL_TABLET | Freq: Every day | ORAL | Status: DC
  Administered 2018-08-14: 100 mg via ORAL
  Filled 2018-08-14: qty 1

## 2018-08-14 MED ORDER — SODIUM CHLORIDE 0.9 % IV BOLUS
500.0000 mL | Freq: Once | INTRAVENOUS | Status: AC
  Administered 2018-08-14: 500 mL via INTRAVENOUS

## 2018-08-14 MED ORDER — AMLODIPINE BESYLATE 5 MG PO TABS: 5.0000 mg | ORAL_TABLET | Freq: Every day | ORAL | Status: DC

## 2018-08-14 NOTE — ED Provider Notes (Signed)
[facility]  TTS has discussed this with the patient and he is opting for discharge at this time, he is medically cleared for discharge   Lavonia Drafts, MD 08/14/18 1718

## 2018-08-14 NOTE — ED Notes (Signed)
Pt given breakfast tray at this time. 

## 2018-08-14 NOTE — ED Notes (Signed)
Pt up to toilet with minimal assistance. Pt had large bm. Pt back to bed and placed on cardiac monitor.

## 2018-08-14 NOTE — ED Notes (Signed)
Pt given pop-sickle at this time per his request. Pt is alert and oriented, lights dimmed for comfort. Pt watching TV at this time, denies any further needs at this time.

## 2018-08-14 NOTE — ED Notes (Signed)
This RN to bedside, pt resting in bed with NAD noted at this time. VSS. Pt denies any needs. Will continue to monitor for further patient needs. Offered snack/water to patient, pt refused at this time.

## 2018-08-14 NOTE — ED Notes (Signed)
This RN to bedside at this time. Introduced self to patient, pt given warm blankets. Pt visualized in NAD, repeat CIWA obtained, spoke with MD regarding CIWA 6 and SB on monitor. Awaiting placement at the [facility] at this time. Will continue to monitor for further patient needs.

## 2018-08-14 NOTE — ED Provider Notes (Signed)
-----------------------------------------   3:20 AM on 08/14/2018 -----------------------------------------   Blood pressure 127/81, pulse 66, temperature 97.8 F (36.6 C), temperature source Oral, resp. rate (!) 23, height 1.727 m (5\' 8" ), weight 65.8 kg, SpO2 99 %.  The patient is calm and cooperative at this time.  There have been no acute events since the last update.  I ordered a coronavirus swab to rule out COVID-19 to facilitate either admission or transfer.  My secretary checked with the [place] and they said that psych/addiction patients are not reviewed after 10 PM.  They will readdress the case in the morning.  I verified that the patient is on CIWA protocol and verify that his home meds have been ordered including his antihypertensives.   Hinda Kehr, MD 08/14/18 [phone]

## 2018-08-14 NOTE — BH Assessment (Signed)
Writer called and spoke with [facility] ([phone]) and was advised patient would not be able to be admitted to their program because he is connected with the [place]. Writer explained the patient has Medicare A&B and staff stated medicare do not cover detox. [employer] will be billed for detox services and they will not approve it if he is connect to the [place].   Writer informed the patient's nurse Jinny Blossom) and ER MD (Dr. Corky Downs).  Writer spoke with the patient, with his nurse Jinny Blossom) about not getting accepted with [facility]. Patient states he is okay to discharged from the ER. He denies SI/HI and AV/H

## 2018-08-14 NOTE — ED Notes (Signed)
Pt resting comfortably at this time, no c/o pain, no agitation noticed.

## 2018-08-14 NOTE — ED Notes (Signed)
Patient discharged safely via [employer] cab co, ambulatory in lobby to cab. Cab directed to [address] where patient receives assistance. IV removed, patient stable

## 2018-08-14 NOTE — ED Notes (Signed)
CSW at bedside to give patient resources regarding homeless shelters and a cab voucher to get him to the homeless shelter. Pt states he is not willing to be discharged and that he wants to go to the [place]. Tania and this RN contacted Calvin, TTS regarding change in patient decision. Kerry Dory states will come and speak with patient. Dr. Corky Downs made aware.

## 2018-08-14 NOTE — BH Assessment (Addendum)
Writer follow up with [facility] ([phone] ext. [phone]). Patient declined request due to he was "medically unstable at the time. Concerned if he was going to detox verus withdrawals and. He questioned if their were cardio vascular issues and risk." However, they requested updated information and they will review him again this morning.  Writer received phone call from [facility] stating they received the information but they are now on diversion. Their last bed was filled. Was advised to try other [facility]. Writer was also advised, to try other facilities that his Medicare may cover.  Spoke with [facility] Transfer Coordinator Claiborne Billings [phone]), she stated their detox and treatment beds unit is currently close.   Information faxed to [facility] [phone]), patient was declined due to medical acuity.  Writer updated ER MD (Dr. Joni Fears)

## 2018-08-14 NOTE — ED Notes (Signed)
This RN and Gwenette Greet, RN, and Calvin, TTS at bedside at this time. Calvin, TTS discussing with patient the plan to fax his information to [facility] in [place] for alcohol detox and if pt is not accepted at [facility] pt is to be discharged from the ER. Patient, TTS, and EDP in agreement with plan.

## 2018-08-14 NOTE — ED Notes (Addendum)
CSW contacted regarding D/C patient and his homeless status. CSW states will come to ER and bring him resources regarding shelters and safe discharges.

## 2018-08-14 NOTE — ED Notes (Signed)
Pt resting comfortably, no c/o pain.

## 2018-08-28 ENCOUNTER — Other Ambulatory Visit: Payer: Self-pay

## 2018-08-28 ENCOUNTER — Emergency Department
Admission: EM | Admit: 2018-08-28 | Discharge: 2018-08-29 | Disposition: A | Discharge status: Home / Self Care | Payer: Medicare Other | Attending: Emergency Medicine | Admitting: Emergency Medicine

## 2018-08-28 DIAGNOSIS — F1010 Alcohol abuse, uncomplicated: Secondary | ICD-10-CM

## 2018-08-28 DIAGNOSIS — F10120 Alcohol abuse with intoxication, uncomplicated: Secondary | ICD-10-CM | POA: Diagnosis present

## 2018-08-28 DIAGNOSIS — Y908 Blood alcohol level of 240 mg/100 ml or more: Secondary | ICD-10-CM | POA: Insufficient documentation

## 2018-08-28 DIAGNOSIS — Z8673 Personal history of transient ischemic attack (TIA), and cerebral infarction without residual deficits: Secondary | ICD-10-CM | POA: Diagnosis not present

## 2018-08-28 DIAGNOSIS — Z79899 Other long term (current) drug therapy: Secondary | ICD-10-CM | POA: Insufficient documentation

## 2018-08-28 DIAGNOSIS — I10 Essential (primary) hypertension: Secondary | ICD-10-CM | POA: Insufficient documentation

## 2018-08-28 DIAGNOSIS — F17210 Nicotine dependence, cigarettes, uncomplicated: Secondary | ICD-10-CM | POA: Diagnosis not present

## 2018-08-28 LAB — COMPREHENSIVE METABOLIC PANEL
ALT: 29 U/L (ref 0–44)
AST: 50 U/L — ABNORMAL HIGH (ref 15–41)
Albumin: 4.1 g/dL (ref 3.5–5.0)
Alkaline Phosphatase: 77 U/L (ref 38–126)
Anion gap: 5 (ref 5–15)
BUN: 10 mg/dL (ref 8–23)
CO2: 26 mmol/L (ref 22–32)
Calcium: 8.3 mg/dL — ABNORMAL LOW (ref 8.9–10.3)
Chloride: 102 mmol/L (ref 98–111)
Creatinine, Ser: 0.88 mg/dL (ref 0.61–1.24)
GFR calc Af Amer: >60 mL/min (ref >60)
GFR calc non Af Amer: >60 mL/min (ref >60)
Glucose, Bld: 140 mg/dL — ABNORMAL HIGH (ref 70–99)
Potassium: 3.6 mmol/L (ref 3.5–5.1)
Sodium: 133 mmol/L — ABNORMAL LOW (ref 135–145)
Total Bilirubin: 0.6 mg/dL (ref 0.3–1.2)
Total Protein: 6.9 g/dL (ref 6.5–8.1)

## 2018-08-28 LAB — URINE DRUG SCREEN, QUALITATIVE (ARMC ONLY)
Amphetamines, Ur Screen: NOT DETECTED
Barbiturates, Ur Screen: NOT DETECTED
Benzodiazepine, Ur Scrn: NOT DETECTED
Cannabinoid 50 Ng, Ur ~~LOC~~: NOT DETECTED
Cocaine Metabolite,Ur ~~LOC~~: NOT DETECTED
MDMA (Ecstasy)Ur Screen: NOT DETECTED
Methadone Scn, Ur: NOT DETECTED
Opiate, Ur Screen: NOT DETECTED
Phencyclidine (PCP) Ur S: NOT DETECTED
Tricyclic, Ur Screen: NOT DETECTED

## 2018-08-28 LAB — SALICYLATE LEVEL: Salicylate Lvl: <7 mg/dL (ref 2.8–30.0)

## 2018-08-28 LAB — ACETAMINOPHEN LEVEL: Acetaminophen (Tylenol), Serum: <10 ug/mL — ABNORMAL LOW (ref 10–30)

## 2018-08-28 LAB — CBC
HCT: 40.4 % (ref 39.0–52.0)
Hemoglobin: 13.9 g/dL (ref 13.0–17.0)
MCH: 34.2 pg — ABNORMAL HIGH (ref 26.0–34.0)
MCHC: 34.4 g/dL (ref 30.0–36.0)
MCV: 99.5 fL (ref 80.0–100.0)
Platelets: 218 10*3/uL (ref 150–400)
RBC: 4.06 MIL/uL — ABNORMAL LOW (ref 4.22–5.81)
RDW: 13.1 % (ref 11.5–15.5)
WBC: 8.2 10*3/uL (ref 4.0–10.5)
nRBC: 0 % (ref 0.0–0.2)

## 2018-08-28 LAB — ETHANOL: Alcohol, Ethyl (B): 361 mg/dL (ref <10)

## 2018-08-28 NOTE — ED Notes (Signed)

## 2018-08-28 NOTE — ED Triage Notes (Signed)
Pt brought in by [facility], pt was sitting on church steps requesting detox for alcohol.

## 2018-08-28 NOTE — ED Provider Notes (Signed)
[facility] Emergency Department Provider Note  ____________________________________________   I have reviewed the triage vital signs and the nursing notes.   HISTORY  Chief Complaint Alcohol Intoxication   History limited by and level 5 caveat due to: Alcohol intoxication  HPI Adam Good is a 72 y.o. male who presents to the emergency department today because of desire to detox from alcohol. The patient has history of significant alcohol abuse. Was drinking earlier today. Very intoxicated on exam and cannot give significant history.    Records reviewed. Per medical record review patient has a history of alcohol abuse. Recent ER visit for same.   Past Medical History:  Diagnosis Date  . Alcohol abuse   . Aortic stenosis   . Bipolar disorder ([place])   . Demand ischemia of myocardium ([place]) 01/2014   a. positive troponins in the setting of a fall during intoxication; b. Myoview 2/16: EF 65-70%, no evi of ischemia or infarct. No RWMA.; c. MV 9/16: mild ischemia in the mid anterolateral, mid anterior, basal anterolateral and basal anterior segments, patietn declined LHC  . Diastolic dysfunction    a. TTE 9/16: EF of 50-55%, Gr2DD, severely dilated LA, and moderate AS; b. TTE 3/19: EF of 50-55%, Gr1DD, mildly dilated left atrium, mildly calcified mitral leaflets, mild AS, mild TR, trace PR, RVSP 46 mmHg, trivial pericardial effusion  . Dyslipidemia   . Essential hypertension   . Gastroesophageal reflux disease   . HLD (hyperlipidemia)   . Sleep apnea   . Stroke [facility])     Patient Active Problem List   Diagnosis Date Noted  . Alcohol abuse with uncomplicated intoxication ([place]) 03/25/2018  . Elevated troponin 10/29/2017  . Dyslipidemia   . Essential hypertension   . Alcohol abuse   . Heart murmur   . Demand ischemia of myocardium ([place]) 01/17/2014    Past Surgical History:  Procedure Laterality Date  . HERNIA REPAIR    . KNEE SURGERY    . NM  MYOVIEW LTD  Jan 2009; 05/12/14   a) No ischemia/infarct. Normal EF - 73%; b) EF 65-70%, no evidence of ischemia or infarction. No RWMA.    Prior to Admission medications   Medication Sig Start Date End Date Taking? Authorizing Provider  albuterol (PROVENTIL HFA;VENTOLIN HFA) 108 (90 Base) MCG/ACT inhaler Inhale 1-2 puffs into the lungs every 6 (six) hours as needed for wheezing or shortness of breath.    [provider]  amLODipine (NORVASC) 5 MG tablet Take 5 mg by mouth daily.    [provider]  aspirin 81 MG chewable tablet Chew 81 mg by mouth daily.    [provider]  carvedilol (COREG) 3.125 MG tablet Take 3.125 mg by mouth 2 (two) times daily.    [provider]  lisinopril (PRINIVIL,ZESTRIL) 20 MG tablet Take 20 mg by mouth daily.    [provider]  rosuvastatin (CRESTOR) 20 MG tablet Take 20 mg by mouth at bedtime.    [provider]  thiamine 100 MG tablet Take 1 tablet (100 mg total) by mouth daily. 03/27/18   Bettey Costa, MD    Allergies Patient has no known allergies.  Family History  Problem Relation Age of Onset  . Throat cancer Mother   . Cancer Father   . CAD Sister   . Hypertension Sister   . Diabetes Brother     Social History Social History   Tobacco Use  . Smoking status: Current Every Day Smoker  Packs/day: 0.50    Years: 57.00    Pack years: 28.50    Types: Cigarettes  . Smokeless tobacco: Current User    Types: Snuff  Substance Use Topics  . Alcohol use: Yes    Alcohol/week: 0.0 standard drinks    Comment: He has been a heavy alcohol drinker for > 30 yrs.  Stats that he quit 3 weeks ago.   . Drug use: No    Review of Systems Unable to obtain reliable ROS ____________________________________________   PHYSICAL EXAM:  VITAL SIGNS: ED Triage Vitals [08/28/18 2148]  Enc Vitals Group     BP (!) 166/114     Pulse Rate 78     Resp 18     Temp 97.7 F (36.5 C)     Temp Source Oral      SpO2 98 %     Weight 160 lb (72.6 kg)     Height 5\' 7"  (1.702 m)     Head Circumference      Peak Flow      Pain Score 0   Constitutional: Awake, alert, intoxicated.  Eyes: Conjunctivae are normal.  ENT      Head: Normocephalic and atraumatic.      Nose: No congestion/rhinnorhea.      Mouth/Throat: Mucous membranes are moist.      Neck: No stridor. Hematological/Lymphatic/Immunilogical: No cervical lymphadenopathy. Cardiovascular: Normal rate, regular rhythm.  No murmurs, rubs, or gallops.  Respiratory: Normal respiratory effort without tachypnea nor retractions. Breath sounds are clear and equal bilaterally. No wheezes/rales/rhonchi. Gastrointestinal: Soft and non tender. No rebound. No guarding.  Genitourinary: Deferred Musculoskeletal: Normal range of motion in all extremities. No lower extremity edema. Neurologic:  Intoxicated. Slurring speech.  Skin:  Skin is warm, dry and intact. No rash noted. Psychiatric: Mood and affect are normal. Speech and behavior are normal. Patient exhibits appropriate insight and judgment.  ____________________________________________    LABS (pertinent positives/negatives)  None  ____________________________________________   EKG  None  ____________________________________________    RADIOLOGY  None  ____________________________________________   PROCEDURES  Procedures  ____________________________________________   INITIAL IMPRESSION / ASSESSMENT AND PLAN / ED COURSE  Pertinent labs & imaging results that were available during my care of the patient were reviewed by me and considered in my medical decision making (see chart for details).   Patient presented to the emergency department via EMS because of desires for alcohol detox.  Patient is quite intoxicated here in the emergency department.  He does not any mumble yes when asked if he wants alcohol detox.  He however cannot give any further significant history.  Will plan  on having TTS evaluate.  ____________________________________________   FINAL CLINICAL IMPRESSION(S) / ED DIAGNOSES  Final diagnoses:  Alcohol abuse     Note: This dictation was prepared with Dragon dictation. Any transcriptional errors that result from this process are unintentional     Nance Pear, MD 08/28/18 2216

## 2018-08-28 NOTE — ED Notes (Signed)
Stand by assistance provided while he ambualted to the BR

## 2018-08-29 MED ORDER — LORAZEPAM 2 MG/ML IJ SOLN: 0.0000 mg | Freq: Four times a day (QID) | INTRAMUSCULAR | Status: DC

## 2018-08-29 MED ORDER — LORAZEPAM 2 MG/ML IJ SOLN: 0.0000 mg | Freq: Two times a day (BID) | INTRAMUSCULAR | Status: DC

## 2018-08-29 MED ORDER — VITAMIN B-1 100 MG PO TABS
100.0000 mg | ORAL_TABLET | Freq: Every day | ORAL | Status: DC
  Administered 2018-08-29: 100 mg via ORAL
  Filled 2018-08-29: qty 1

## 2018-08-29 MED ORDER — LORAZEPAM 1 MG PO TABS
1.0000 mg | ORAL_TABLET | Freq: Once | ORAL | Status: AC
  Administered 2018-08-29: 1 mg via ORAL
  Filled 2018-08-29: qty 1

## 2018-08-29 MED ORDER — THIAMINE HCL 100 MG/ML IJ SOLN: 100.0000 mg | Freq: Every day | INTRAMUSCULAR | Status: DC

## 2018-08-29 MED ORDER — THIAMINE HCL 100 MG/ML IJ SOLN
Freq: Once | INTRAVENOUS | Status: DC
  Filled 2018-08-29: qty 1000

## 2018-08-29 MED ORDER — CHLORDIAZEPOXIDE HCL 25 MG PO CAPS
25.0000 mg | ORAL_CAPSULE | Freq: Once | ORAL | Status: AC
  Administered 2018-08-29: 18:00:00 25 mg via ORAL
  Filled 2018-08-29: qty 1

## 2018-08-29 MED ORDER — LORAZEPAM 2 MG PO TABS
0.0000 mg | ORAL_TABLET | Freq: Four times a day (QID) | ORAL | Status: DC
  Administered 2018-08-29: 18:00:00 2 mg via ORAL
  Filled 2018-08-29: qty 1

## 2018-08-29 MED ORDER — LORAZEPAM 2 MG PO TABS: 2.0000 mg | ORAL_TABLET | Freq: Once | ORAL | Status: DC

## 2018-08-29 MED ORDER — LORAZEPAM 2 MG PO TABS: 0.0000 mg | ORAL_TABLET | Freq: Two times a day (BID) | ORAL | Status: DC

## 2018-08-29 MED ORDER — VITAMIN B-1 100 MG PO TABS
50.0000 mg | ORAL_TABLET | Freq: Once | ORAL | Status: AC
  Administered 2018-08-29: 50 mg via ORAL
  Filled 2018-08-29: qty 1

## 2018-08-29 MED ORDER — CHLORDIAZEPOXIDE HCL 25 MG PO CAPS: ORAL_CAPSULE | ORAL | 0 refills | Status: AC

## 2018-08-29 NOTE — ED Notes (Signed)
Call bel light answered, pt assisted with urinal bedside

## 2018-08-29 NOTE — ED Notes (Signed)
VOL  PENDING  PLACEMENT PT  SEEN  BY  TTS

## 2018-08-29 NOTE — ED Provider Notes (Signed)
-----------------------------------------   12:10 AM on 08/29/2018 -----------------------------------------  Noted elevated EtOH.  Will administer banana bag and placed on CIWA scale.  TTS to evaluate when patient is able to participate in interview.   ----------------------------------------- 6:19 AM on 08/29/2018 -----------------------------------------  Patient refused IV.  No events overnight.  Patient will require TTS evaluation once he is awake and sober.   Paulette Blanch, MD 08/29/18 [phone]

## 2018-08-29 NOTE — ED Notes (Signed)
Assisted pt to the restroom to void. One person assist.  Pt returned to bed and resting comfortably.

## 2018-08-29 NOTE — ED Notes (Signed)
Pt resting at this time. This RN will continue to monitor.

## 2018-08-29 NOTE — ED Notes (Signed)
Pt verbalizes understanding of discharge instructions.

## 2018-08-29 NOTE — BH Assessment (Signed)
Assessment Note  Adam Good is an 73 y.o. male who present to the ER seeking assistance with alcohol detox. Patient states he drinks approximately eight forty-ounce beers a day. He is unsure how long he has drunk this about and frequency. Upon arrival to the ER patient BAC was 361.  Patient further reports, his alcohol use have been ongoing "for years" and need help in order to stop. He reports his past symptoms of withdrawal were; shakes, cold chills, dizziness and nausea.  During the interview, the patient was calm, cooperative and pleasant. He was able to provide appropriate answers to the questions. He denies the use of any other mind-altering substances. He also denies involvement with the legal system and have no history of violence or aggression. Patient denies SI/HI and A/H.   Diagnosis: Alcohol Use Disorder  Past Medical History:  Past Medical History:  Diagnosis Date  . Alcohol abuse   . Aortic stenosis   . Bipolar disorder ([place])   . Demand ischemia of myocardium ([place]) 01/2014   a. positive troponins in the setting of a fall during intoxication; b. Myoview 2/16: EF 65-70%, no evi of ischemia or infarct. No RWMA.; c. MV 9/16: mild ischemia in the mid anterolateral, mid anterior, basal anterolateral and basal anterior segments, patietn declined LHC  . Diastolic dysfunction    a. TTE 9/16: EF of 50-55%, Gr2DD, severely dilated LA, and moderate AS; b. TTE 3/19: EF of 50-55%, Gr1DD, mildly dilated left atrium, mildly calcified mitral leaflets, mild AS, mild TR, trace PR, RVSP 46 mmHg, trivial pericardial effusion  . Dyslipidemia   . Essential hypertension   . Gastroesophageal reflux disease   . HLD (hyperlipidemia)   . Sleep apnea   . Stroke [facility])     Past Surgical History:  Procedure Laterality Date  . HERNIA REPAIR    . KNEE SURGERY    . NM MYOVIEW LTD  Jan 2009; 05/12/14   a) No ischemia/infarct. Normal EF - 73%; b) EF 65-70%, no evidence of ischemia or infarction. No  RWMA.    Family History:  Family History  Problem Relation Age of Onset  . Throat cancer Mother   . Cancer Father   . CAD Sister   . Hypertension Sister   . Diabetes Brother     Social History:  reports that he has been smoking cigarettes. He has a 28.50 pack-year smoking history. His smokeless tobacco use includes snuff. He reports current alcohol use. He reports that he does not use drugs.  Additional Social History:  Alcohol / Drug Use Pain Medications: See PTA Prescriptions: See PTA Over the Counter: See PTA History of alcohol / drug use?: Yes Substance #1 Name of Substance 1: Alcohol 1 - Amount (size/oz): "8-40oz" 1 - Frequency: Daily 1 - Duration: Unable quantify 1 - Last Use / Amount: 08/28/2018  CIWA: CIWA-Ar BP: (!) 108/53 Pulse Rate: 73 Nausea and Vomiting: mild nausea with no vomiting Tactile Disturbances: none Tremor: not visible, but can be felt fingertip to fingertip Auditory Disturbances: not present Paroxysmal Sweats: no sweat visible Visual Disturbances: not present Anxiety: no anxiety, at ease Headache, Fullness in Head: none present Agitation: normal activity Orientation and Clouding of Sensorium: oriented and can do serial additions CIWA-Ar Total: 2 COWS:    Allergies: No Known Allergies  Home Medications: (Not in a hospital admission)   OB/GYN Status:  No LMP for male patient.  General Assessment Data Location of Assessment: [facility] ED TTS Assessment: In system Is this a  Tele or Face-to-Face Assessment?: Face-to-Face Is this an Initial Assessment or a Re-assessment for this encounter?: Initial Assessment Language Other than English: No Living Arrangements: Homeless/Shelter What gender do you identify as?: Male Marital status: Widowed Pregnancy Status: No Living Arrangements: Other (Comment)(Homeless) Can pt return to current living arrangement?: Yes Admission Status: Voluntary Is patient capable of signing voluntary admission?:  Yes Referral Source: Self/Family/Friend Insurance type: Medicare A&B  Medical Screening Exam ([place]) Medical Exam completed: Yes  Crisis Care Plan Living Arrangements: Other (Comment)(Homeless) Legal Guardian: Other:(Self) Name of Psychiatrist: Reports of none Name of Therapist: Reports of none  Education Status Is patient currently in school?: No Is the patient employed, unemployed or receiving disability?: Unemployed, Receiving disability income  Risk to self with the past 6 months Suicidal Ideation: No Has patient been a risk to self within the past 6 months prior to admission? : No Suicidal Intent: No Has patient had any suicidal intent within the past 6 months prior to admission? : No Is patient at risk for suicide?: No Suicidal Plan?: No Has patient had any suicidal plan within the past 6 months prior to admission? : No Access to Means: No What has been your use of drugs/alcohol within the last 12 months?: Alcohol Previous Attempts/Gestures: No How many times?: 0 Other Self Harm Risks: Alcohol abuse Triggers for Past Attempts: None known Intentional Self Injurious Behavior: None Family Suicide History: No Recent stressful life event(s): Other (Comment) Persecutory voices/beliefs?: No Depression: No Depression Symptoms: Feeling worthless/self pity, Guilt Substance abuse history and/or treatment for substance abuse?: Yes Suicide prevention information given to non-admitted patients: Not applicable  Risk to Others within the past 6 months Homicidal Ideation: No Does patient have any lifetime risk of violence toward others beyond the six months prior to admission? : No Thoughts of Harm to Others: No Current Homicidal Intent: No Current Homicidal Plan: No Access to Homicidal Means: No Identified Victim: Reports of none History of harm to others?: No Assessment of Violence: None Noted Violent Behavior Description: Reports of none Does patient have access  to weapons?: No Criminal Charges Pending?: No Does patient have a court date: No Is patient on probation?: No  Psychosis Hallucinations: None noted Delusions: None noted  Mental Status Report Appearance/Hygiene: Unremarkable Eye Contact: Good Motor Activity: Freedom of movement, Unremarkable Speech: Logical/coherent, Unremarkable Level of Consciousness: Alert Mood: Sad, Anxious, Pleasant Affect: Appropriate to circumstance, Sad Anxiety Level: Minimal Thought Processes: Coherent, Relevant Judgement: Partial Orientation: Person, Place, Time, Situation, Appropriate for developmental age Obsessive Compulsive Thoughts/Behaviors: Minimal  Cognitive Functioning Concentration: Normal Memory: Recent Intact, Remote Intact Is patient IDD: No Insight: Fair Impulse Control: Fair Appetite: Good Have you had any weight changes? : No Change Sleep: No Change Total Hours of Sleep: 7 Vegetative Symptoms: None  ADLScreening [facility] Assessment Services) Patient's cognitive ability adequate to safely complete daily activities?: Yes Patient able to express need for assistance with ADLs?: Yes Independently performs ADLs?: Yes (appropriate for developmental age)  Prior Inpatient Therapy Prior Inpatient Therapy: Yes Prior Therapy Dates: Unable to remember dates Prior Therapy Facilty/Provider(s): [place], "I don't remember the other places" Reason for Treatment: Alcohol Use Disorder  Prior Outpatient Therapy Prior Outpatient Therapy: No Does patient have an ACCT team?: No Does patient have Intensive In-House Services?  : No Does patient have [facility] services? : No Does patient have P4CC services?: No  ADL Screening (condition at time of admission) Patient's cognitive ability adequate to safely complete daily activities?: Yes Is the patient  deaf or have difficulty hearing?: No Does the patient have difficulty seeing, even when wearing glasses/contacts?: No Does the patient have  difficulty concentrating, remembering, or making decisions?: No Patient able to express need for assistance with ADLs?: Yes Does the patient have difficulty dressing or bathing?: No Independently performs ADLs?: Yes (appropriate for developmental age) Does the patient have difficulty walking or climbing stairs?: No Weakness of Legs: None Weakness of Arms/Hands: None  Home Assistive Devices/Equipment Home Assistive Devices/Equipment: None  Therapy Consults (therapy consults require a physician order) PT Evaluation Needed: No OT Evalulation Needed: No SLP Evaluation Needed: No Abuse/Neglect Assessment (Assessment to be complete while patient is alone) Abuse/Neglect Assessment Can Be Completed: Yes Physical Abuse: Denies Verbal Abuse: Denies Sexual Abuse: Denies Exploitation of patient/patient's resources: Denies Self-Neglect: Denies Values / Beliefs Cultural Requests During Hospitalization: None Spiritual Requests During Hospitalization: None Consults Spiritual Care Consult Needed: No Social Work Consult Needed: No         Child/Adolescent Assessment Running Away Risk: Denies(Patient is an adult)  Disposition:  Disposition Initial Assessment Completed for this Encounter: Yes  On Site Evaluation by:   Reviewed with Physician:    Gunnar Fusi MS, LCAS, [facility], [place], [place] Therapeutic Triage Specialist 08/29/2018 12:40 PM

## 2018-08-29 NOTE — ED Notes (Signed)
Pt moved to [address] from [place], this RN introduced self to patient at this time, CIWA performed. Pt with noted tremors and severely unsteady with movement. Will speak with MD regarding CIWA orders. Pt denies further needs, states does not want his dinner tray.

## 2018-08-29 NOTE — ED Notes (Signed)
TTS has spoken with [facility] Who have denied the pt for detox due to no bed availability.

## 2018-08-29 NOTE — ED Notes (Signed)
Hourly rounding reveals patient sleeping in room. No complaints, stable, in no acute distress. Q15 minute rounds and monitoring via Security to continue. 

## 2018-08-29 NOTE — ED Notes (Signed)
Upon RN entrance pt is sleeping, pt woke up,  RN explained discharge instructions and medication administration pt verbalizes understanding of discharge instructions

## 2018-08-29 NOTE — BH Assessment (Signed)
Spoke with [facility] Lavella Lemons [phone] [phone]). They do not have any psych beds. Was advised to refer patient to [facility].  Writer spoke with [facility] ([phone]), she stated they did not have any psych beds and if they did, the patient didn't meet criteria because he's seeking detox and have no SI/HI and AV/H. Writer was advised to call [facility] and let them know he needs a medical admission and not Psych bed. "They don't have psych beds but they do have medical beds."  Writer called and spoke with [facility] Lavella Lemons [phone] ext.[phone]). Informed her what [facility] said. She stated they would take a look at it and to fax the referral.  Writer updated with ER MD (Dr. Ellender Hose) about what the [place] had said and requesting. Per ER MD, patient does not meet criteria for medical admission.  Writer called and spoke with [facility] Lavella Lemons [phone]) and updated her what the ER MD said. She apologized for not been able to help. Writer asked if it was okay to refer him to other facilities, even though he has VA benefits, he also have Medicare. She stated it would be okay, the accepting facility can bill his medicare as primary insurance.   Patient referred to: [facility] [phone]) unable to meet medical needs. [facility] [phone] a voicemail message, requesting a return phone call.

## 2018-08-29 NOTE — ED Provider Notes (Signed)
-----------------------------------------   11:18 PM on 08/29/2018 -----------------------------------------  Patient refused by [facility] and [facility] detox.  No detox options.  He is medically stable without significant withdrawal.  Not intoxicated, clinically sober.  Will discharge with Librium taper.   Carrie Mew, MD 08/29/18 [phone]

## 2018-09-26 ENCOUNTER — Emergency Department
Admission: EM | Admit: 2018-09-26 | Discharge: 2018-09-27 | Disposition: A | Discharge status: Home / Self Care | Payer: Medicare Other | Attending: Student in an Organized Health Care Education/Training Program | Admitting: Student in an Organized Health Care Education/Training Program

## 2018-09-26 ENCOUNTER — Other Ambulatory Visit: Payer: Self-pay

## 2018-09-26 ENCOUNTER — Emergency Department: Payer: Medicare Other

## 2018-09-26 ENCOUNTER — Encounter: Payer: Self-pay | Admitting: Emergency Medicine

## 2018-09-26 DIAGNOSIS — F17210 Nicotine dependence, cigarettes, uncomplicated: Secondary | ICD-10-CM | POA: Diagnosis not present

## 2018-09-26 DIAGNOSIS — Z8673 Personal history of transient ischemic attack (TIA), and cerebral infarction without residual deficits: Secondary | ICD-10-CM | POA: Insufficient documentation

## 2018-09-26 DIAGNOSIS — Z79899 Other long term (current) drug therapy: Secondary | ICD-10-CM | POA: Diagnosis not present

## 2018-09-26 DIAGNOSIS — I10 Essential (primary) hypertension: Secondary | ICD-10-CM | POA: Insufficient documentation

## 2018-09-26 DIAGNOSIS — Z7982 Long term (current) use of aspirin: Secondary | ICD-10-CM | POA: Insufficient documentation

## 2018-09-26 DIAGNOSIS — F1010 Alcohol abuse, uncomplicated: Secondary | ICD-10-CM | POA: Diagnosis not present

## 2018-09-26 DIAGNOSIS — R079 Chest pain, unspecified: Secondary | ICD-10-CM | POA: Diagnosis present

## 2018-09-26 LAB — BASIC METABOLIC PANEL
Anion gap: 15 (ref 5–15)
BUN: 13 mg/dL (ref 8–23)
CO2: 20 mmol/L — ABNORMAL LOW (ref 22–32)
Calcium: 8.3 mg/dL — ABNORMAL LOW (ref 8.9–10.3)
Chloride: 103 mmol/L (ref 98–111)
Creatinine, Ser: 0.86 mg/dL (ref 0.61–1.24)
GFR calc Af Amer: >60 mL/min (ref >60)
GFR calc non Af Amer: >60 mL/min (ref >60)
Glucose, Bld: 183 mg/dL — ABNORMAL HIGH (ref 70–99)
Potassium: 3.4 mmol/L — ABNORMAL LOW (ref 3.5–5.1)
Sodium: 138 mmol/L (ref 135–145)

## 2018-09-26 LAB — TROPONIN I (HIGH SENSITIVITY)
Troponin I (High Sensitivity): 51 ng/L — ABNORMAL HIGH (ref <18)
Troponin I (High Sensitivity): 49 ng/L — ABNORMAL HIGH (ref <18)

## 2018-09-26 LAB — CBC
HCT: 39 % (ref 39.0–52.0)
Hemoglobin: 13.5 g/dL (ref 13.0–17.0)
MCH: 35.1 pg — ABNORMAL HIGH (ref 26.0–34.0)
MCHC: 34.6 g/dL (ref 30.0–36.0)
MCV: 101.3 fL — ABNORMAL HIGH (ref 80.0–100.0)
Platelets: 172 10*3/uL (ref 150–400)
RBC: 3.85 MIL/uL — ABNORMAL LOW (ref 4.22–5.81)
RDW: 13.2 % (ref 11.5–15.5)
WBC: 5.5 10*3/uL (ref 4.0–10.5)
nRBC: 0 % (ref 0.0–0.2)

## 2018-09-26 LAB — ETHANOL: Alcohol, Ethyl (B): 200 mg/dL — ABNORMAL HIGH (ref <10)

## 2018-09-26 MED ORDER — LORAZEPAM 2 MG/ML IJ SOLN: 0.0000 mg | Freq: Two times a day (BID) | INTRAMUSCULAR | Status: DC

## 2018-09-26 MED ORDER — LORAZEPAM 2 MG PO TABS: 0.0000 mg | ORAL_TABLET | Freq: Four times a day (QID) | ORAL | Status: DC

## 2018-09-26 MED ORDER — VITAMIN B-1 100 MG PO TABS
100.0000 mg | ORAL_TABLET | Freq: Once | ORAL | Status: AC
  Administered 2018-09-26: 100 mg via ORAL
  Filled 2018-09-26: qty 1

## 2018-09-26 MED ORDER — THIAMINE HCL 100 MG/ML IJ SOLN: 100.0000 mg | Freq: Every day | INTRAMUSCULAR | Status: DC

## 2018-09-26 MED ORDER — CHLORDIAZEPOXIDE HCL 25 MG PO CAPS
25.0000 mg | ORAL_CAPSULE | Freq: Once | ORAL | Status: AC
  Administered 2018-09-26: 19:00:00 25 mg via ORAL
  Filled 2018-09-26: qty 1

## 2018-09-26 MED ORDER — LORAZEPAM 2 MG PO TABS: 0.0000 mg | ORAL_TABLET | Freq: Two times a day (BID) | ORAL | Status: DC

## 2018-09-26 MED ORDER — LORAZEPAM 2 MG/ML IJ SOLN: 0.0000 mg | Freq: Four times a day (QID) | INTRAMUSCULAR | Status: DC

## 2018-09-26 MED ORDER — VITAMIN B-1 100 MG PO TABS
100.0000 mg | ORAL_TABLET | Freq: Every day | ORAL | Status: DC
  Administered 2018-09-27: 100 mg via ORAL
  Filled 2018-09-26: qty 1

## 2018-09-26 NOTE — ED Notes (Addendum)
Pt ambulated to the bathroom by this RN. Pt with gait at baseline from prior interactions with patient.

## 2018-09-26 NOTE — ED Triage Notes (Signed)
See first nurse note. Pt c/o mid-sternal chest pain reproducible with palpation starting x2 days ago. Hx alcoholism, pt states he drinks 8-9 40oz beers a day. States he has had no ETOH today but is slurring words and unsteady on feet. 20G PIV in place to LFA, placed by [facility].

## 2018-09-26 NOTE — ED Notes (Signed)
Clinical nurse caseworker made aware that this RN received a call from [place], pt and APS state patient is homeless, this RN explained that due to HIPPA unable to give information out over the phone and patient has no documented legal guardian, APS states will fax over a medical release for patient. This RN spoke with caseworker regarding how patient was homeless however APS involved. Pt is unable to state how APS is involved. [place] Dy Nurse Caseworker states outside source filed APS report and upon APS arrival to assess patient he requested to go to the hospital.

## 2018-09-26 NOTE — ED Notes (Signed)
Blue and red top tubes sent to lab.

## 2018-09-26 NOTE — ED Provider Notes (Signed)
[facility] Emergency Department Provider Note    First MD Initiated Contact with Patient 09/26/18 1610     (approximate)  I have reviewed the triage vital signs and the nursing notes.   HISTORY  Chief Complaint Chest Pain    HPI Adam Good is a 73 y.o. male chronic alcoholic with below listed past medical history presents to the ER for evaluation of intoxication.  He currently denies any chest pain or shortness of breath.  No nausea or vomiting.  He is currently homeless and states "been hanging out with the wrong friends." "Once I get my stimulus check I am moving to [place]."  Reportedly told APS who came to evaluate him in front of the church today that he was having chest pain for 1 day however when I asked him about this he is not having any chest pain denies any shortness of breath.  NM Myocard 2019:Pharmacological myocardial perfusion imaging study with no significant  ischemia Normal wall motion, EF estimated at 58% No EKG changes concerning for ischemia at peak stress or in recovery. Resting EKG with LVH/repolarization abnormality, T-wave abnormality precordial leads, inferior leads Low risk scan   Signed, Esmond Plants, MD, Ph.D [facility]   Past Medical History:  Diagnosis Date  . Alcohol abuse   . Aortic stenosis   . Bipolar disorder ([place])   . Demand ischemia of myocardium ([place]) 01/2014   a. positive troponins in the setting of a fall during intoxication; b. Myoview 2/16: EF 65-70%, no evi of ischemia or infarct. No RWMA.; c. MV 9/16: mild ischemia in the mid anterolateral, mid anterior, basal anterolateral and basal anterior segments, patietn declined LHC  . Diastolic dysfunction    a. TTE 9/16: EF of 50-55%, Gr2DD, severely dilated LA, and moderate AS; b. TTE 3/19: EF of 50-55%, Gr1DD, mildly dilated left atrium, mildly calcified mitral leaflets, mild AS, mild TR, trace PR, RVSP 46 mmHg, trivial pericardial effusion  .  Dyslipidemia   . Essential hypertension   . Gastroesophageal reflux disease   . HLD (hyperlipidemia)   . Sleep apnea   . Stroke [facility])    Family History  Problem Relation Age of Onset  . Throat cancer Mother   . Cancer Father   . CAD Sister   . Hypertension Sister   . Diabetes Brother    Past Surgical History:  Procedure Laterality Date  . HERNIA REPAIR    . KNEE SURGERY    . NM MYOVIEW LTD  Jan 2009; 05/12/14   a) No ischemia/infarct. Normal EF - 73%; b) EF 65-70%, no evidence of ischemia or infarction. No RWMA.   Patient Active Problem List   Diagnosis Date Noted  . Alcohol abuse with uncomplicated intoxication ([place]) 03/25/2018  . Elevated troponin 10/29/2017  . Dyslipidemia   . Essential hypertension   . Alcohol abuse   . Heart murmur   . Demand ischemia of myocardium ([place]) 01/17/2014      Prior to Admission medications   Medication Sig Start Date End Date Taking? Authorizing Provider  albuterol (PROVENTIL HFA;VENTOLIN HFA) 108 (90 Base) MCG/ACT inhaler Inhale 1-2 puffs into the lungs every 6 (six) hours as needed for wheezing or shortness of breath.    [provider]  amLODipine (NORVASC) 5 MG tablet Take 5 mg by mouth daily.    [provider]  aspirin 81 MG chewable tablet Chew 81 mg by mouth daily.    [provider]  carvedilol (COREG) 3.125  MG tablet Take 3.125 mg by mouth 2 (two) times daily.    [provider]  lisinopril (PRINIVIL,ZESTRIL) 20 MG tablet Take 20 mg by mouth daily.    [provider]  rosuvastatin (CRESTOR) 20 MG tablet Take 20 mg by mouth at bedtime.    [provider]  thiamine 100 MG tablet Take 1 tablet (100 mg total) by mouth daily. 03/27/18   Bettey Costa, MD    Allergies Patient has no known allergies.    Social History Social History   Tobacco Use  . Smoking status: Current Every Day Smoker    Packs/day: 0.50    Years: 57.00    Pack years: 28.50    Types: Cigarettes  .  Smokeless tobacco: Current User    Types: Snuff  Substance Use Topics  . Alcohol use: Yes    Alcohol/week: 0.0 standard drinks    Comment: He has been a heavy alcohol drinker for > 30 yrs.  Stats that he quit 3 weeks ago.   . Drug use: No    Review of Systems Patient denies headaches, rhinorrhea, blurry vision, numbness, shortness of breath, chest pain, edema, cough, abdominal pain, nausea, vomiting, diarrhea, dysuria, fevers, rashes or hallucinations unless otherwise stated above in HPI. ____________________________________________   PHYSICAL EXAM:  VITAL SIGNS: Vitals:   09/26/18 1830 09/26/18 1905  BP: (!) 133/92 (!) 145/84  Pulse:  (!) 56  Resp: 11 16  Temp:    SpO2:  97%    Constitutional: Alert, smells heavily of alcohol Eyes: Conjunctivae are normal.  Head: Atraumatic. Nose: No congestion/rhinnorhea. Mouth/Throat: Mucous membranes are moist.   Neck: No stridor. Painless ROM.  Cardiovascular: Normal rate, regular rhythm. Grossly normal heart sounds.  Good peripheral circulation. Respiratory: Normal respiratory effort.  No retractions. Lungs CTAB. Gastrointestinal: Soft and nontender. No distention. No abdominal bruits. No CVA tenderness. Genitourinary:  Musculoskeletal: No lower extremity tenderness nor edema.  No joint effusions. Neurologic:  Slurred, intoxicated sounding speech. No gross focal neurologic deficits are appreciated. No facial droop Skin:  Skin is warm, dry and intact. No rash noted. Psychiatric: Mood and affect are normal. Pleasant and cooperative  ____________________________________________   LABS (all labs ordered are listed, but only abnormal results are displayed)  Results for orders placed or performed during the hospital encounter of 09/26/18 (from the past 24 hour(s))  Basic metabolic panel     Status: Abnormal   Collection Time: 09/26/18  1:37 PM  Result Value Ref Range   Sodium 138 135 - 145 mmol/L   Potassium 3.4 (L) 3.5 - 5.1 mmol/L    Chloride 103 98 - 111 mmol/L   CO2 20 (L) 22 - 32 mmol/L   Glucose, Bld 183 (H) 70 - 99 mg/dL   BUN 13 8 - 23 mg/dL   Creatinine, Ser 0.86 0.61 - 1.24 mg/dL   Calcium 8.3 (L) 8.9 - 10.3 mg/dL   GFR calc non Af Amer >60 >60 mL/min   GFR calc Af Amer >60 >60 mL/min   Anion gap 15 5 - 15  CBC     Status: Abnormal   Collection Time: 09/26/18  1:37 PM  Result Value Ref Range   WBC 5.5 4.0 - 10.5 K/uL   RBC 3.85 (L) 4.22 - 5.81 MIL/uL   Hemoglobin 13.5 13.0 - 17.0 g/dL   HCT 39.0 39.0 - 52.0 %   MCV 101.3 (H) 80.0 - 100.0 fL   MCH 35.1 (H) 26.0 - 34.0 pg   MCHC 34.6  30.0 - 36.0 g/dL   RDW 13.2 11.5 - 15.5 %   Platelets 172 150 - 400 K/uL   nRBC 0.0 0.0 - 0.2 %  Troponin I (High Sensitivity)     Status: Abnormal   Collection Time: 09/26/18  1:37 PM  Result Value Ref Range   Troponin I (High Sensitivity) 51 (H) <18 ng/L  Ethanol     Status: Abnormal   Collection Time: 09/26/18  1:37 PM  Result Value Ref Range   Alcohol, Ethyl (B) 200 (H) <10 mg/dL  Troponin I (High Sensitivity)     Status: Abnormal   Collection Time: 09/26/18  3:31 PM  Result Value Ref Range   Troponin I (High Sensitivity) 49 (H) <18 ng/L   ____________________________________________  EKG My review and personal interpretation at Time: 13:22   Indication: intoxication  Rate: 70  Rhythm: sinus Axis: left Other: t wave inversions in I and aVL are unchanged from previous, no stemi or depression ____________________________________________  RADIOLOGY  I personally reviewed all radiographic images ordered to evaluate for the above acute complaints and reviewed radiology reports and findings.  These findings were personally discussed with the patient.  Please see medical record for radiology report.  ____________________________________________   PROCEDURES  Procedure(s) performed:  Procedures    Critical Care performed: no ____________________________________________   INITIAL IMPRESSION /  ASSESSMENT AND PLAN / ED COURSE  Pertinent labs & imaging results that were available during my care of the patient were reviewed by me and considered in my medical decision making (see chart for details).   DDX: Alcohol abuse, alcohol intoxication, dependence, ACS, cardia myopathy, electrolyte abnormality, withdrawal, homelessness  Adam Good is a 73 y.o. who presents to the ED valuation of alcohol abuse.  There is reported chest pain per triage note patient denies any chest pain or discomfort however he is intoxicated therefore will placed on cardiac monitor and order enzymes.  He is hemodynamically stable.  No signs or symptoms to suggest allergy.  States his primary issue today is his homelessness and recurrent alcohol abuse.  His initial troponin is borderline elevated but he is been worked up for chest pain in the past with no evidence of CAD or ischemia.  Does have mild left ear.  His EKG is unchanged from previous.  Will order serial enzymes as is clinical status does not seem consistent with ACS.  Clinical Course as of Sep 26 2046  Fri Sep 26, 2018  1749 Patient without any discomfort.  Certainly no signs of withdrawal.  Denies any chest pain or shortness of breath.  Requesting something to eat.  His primary reason for coming to the ER was to get help with detox however patient has been refused for nearly every detox facility in her area due to his persistent and chronic dependence on alcohol.  Even at this time does not seem that he is dedicated or that interested in alcohol detox at this time.  We will give him some food.  APS has been notified and they are aware of the patient.  Patient's repeat troponin is without delta.  P does not seem consistent with ACS.  Given the patient's homelessness and alcohol dependence will be observed in the ER for social work consultation.   [PR]    Clinical Course User Index [PR] Merlyn Lot, MD    The patient was evaluated in Emergency  Department today for the symptoms described in the history of present illness. He/she was evaluated in the context  of the global COVID-19 pandemic, which necessitated consideration that the patient might be at risk for infection with the SARS-CoV-2 virus that causes COVID-19. Institutional protocols and algorithms that pertain to the evaluation of patients at risk for COVID-19 are in a state of rapid change based on information released by regulatory bodies including the CDC and federal and state organizations. These policies and algorithms were followed during the patient's care in the ED.  As part of my medical decision making, I reviewed the following data within the [place] notes reviewed and incorporated, Labs reviewed, notes from prior ED visits and [place] Controlled Substance Database   ____________________________________________   FINAL CLINICAL IMPRESSION(S) / ED DIAGNOSES  Final diagnoses:  Alcohol abuse      NEW MEDICATIONS STARTED DURING THIS VISIT:  New Prescriptions   No medications on file     Note:  This document was prepared using Dragon voice recognition software and may include unintentional dictation errors.    Merlyn Lot, MD 09/26/18 2049

## 2018-09-26 NOTE — ED Triage Notes (Signed)
FIRST NURSE NOTE- brought EMS.  APS called EMS.  APS agent rongeiya luther [phone].  Pt has had chest pain for 1 day now.  Pain reproducible per EMS. 324 mg ASA by EMS.

## 2018-09-26 NOTE — Care Management (Addendum)
EDRN updated EDRNCM that APS has called her requesting information. I have left message with dispatch APS [phone] to have APS call me with update and that maybe I can assist them. Per EDRN, APS sent patient to hospital to detox. Patient is also affiliated with [place]. Update at 1722: callback from [place]. APS; Ronnie with APS sent patient to hospital per patient request as Edd Arbour was attempting to open APS case today (referral today from outside source per Jabueline).

## 2018-09-27 NOTE — Social Work (Signed)
TOC CM/SW consult received.   The patient is a 73 year old male who has a long history of alcohol abuse.  According to notes and patient reporting he has had difficulty with maintenance (sobriety) for over three decades.    [facility] and [facility] [place] has assisted him w/detox placements however he noted that "I just did not like them.  They sent me far.  All the way to [place], [place], and other places, and I just could not do it."  He noted that he would like to get to [place], [address] that he has a lot of "drinking buddies" in [place] area. Plans to take the bus on Monday, morning at 6:00 AM to [facility].  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 He demonstrated good awareness but at this time he is in contemplation stage of change.    Plan: Patient provided with two [employer] bus passes.  [employer] banker, operational consulted, and they are aware of plan.    Berenice Bouton, MSW, LCSW  [phone] 8am-6pm (weekends) or CSW ED # [phone]

## 2018-09-27 NOTE — ED Notes (Signed)
Pt provided with urinal. Pt denies other needs.

## 2018-09-27 NOTE — ED Notes (Signed)
Pt given meal tray.

## 2018-09-27 NOTE — ED Provider Notes (Signed)
Social work has met with the patient, he is requesting assistance to get to the [facility] which we will provide.   Lavonia Drafts, MD 09/27/18 [phone]

## 2018-09-27 NOTE — ED Notes (Signed)
Report to angela, rn. 

## 2018-09-27 NOTE — ED Notes (Signed)
Pt sleeping, urinal emptied of 268ml concentrated yellow urine. Po fluids at bedside.

## 2018-09-27 NOTE — ED Notes (Signed)
Pt sleeping. 

## 2018-09-27 NOTE — ED Notes (Signed)
Report from [place], rn.

## 2018-10-22 ENCOUNTER — Other Ambulatory Visit: Payer: Self-pay

## 2018-10-22 ENCOUNTER — Emergency Department
Admission: EM | Admit: 2018-10-22 | Discharge: 2018-10-22 | Disposition: A | Discharge status: Home / Self Care | Payer: Medicare Other | Attending: Emergency Medicine | Admitting: Emergency Medicine

## 2018-10-22 ENCOUNTER — Encounter: Payer: Self-pay | Admitting: Emergency Medicine

## 2018-10-22 DIAGNOSIS — I10 Essential (primary) hypertension: Secondary | ICD-10-CM | POA: Insufficient documentation

## 2018-10-22 DIAGNOSIS — F17210 Nicotine dependence, cigarettes, uncomplicated: Secondary | ICD-10-CM | POA: Insufficient documentation

## 2018-10-22 DIAGNOSIS — R21 Rash and other nonspecific skin eruption: Secondary | ICD-10-CM | POA: Diagnosis present

## 2018-10-22 DIAGNOSIS — Z79899 Other long term (current) drug therapy: Secondary | ICD-10-CM | POA: Insufficient documentation

## 2018-10-22 DIAGNOSIS — A46 Erysipelas: Secondary | ICD-10-CM | POA: Insufficient documentation

## 2018-10-22 DIAGNOSIS — L309 Dermatitis, unspecified: Secondary | ICD-10-CM | POA: Insufficient documentation

## 2018-10-22 LAB — CBC WITH DIFFERENTIAL/PLATELET
Abs Immature Granulocytes: 0.03 10*3/uL (ref 0.00–0.07)
Basophils Absolute: 0.1 10*3/uL (ref 0.0–0.1)
Basophils Relative: 1 %
Eosinophils Absolute: 0.6 10*3/uL — ABNORMAL HIGH (ref 0.0–0.5)
Eosinophils Relative: 8 %
HCT: 39.1 % (ref 39.0–52.0)
Hemoglobin: 13.4 g/dL (ref 13.0–17.0)
Immature Granulocytes: 1 %
Lymphocytes Relative: 28 %
Lymphs Abs: 1.9 10*3/uL (ref 0.7–4.0)
MCH: 35.2 pg — ABNORMAL HIGH (ref 26.0–34.0)
MCHC: 34.3 g/dL (ref 30.0–36.0)
MCV: 102.6 fL — ABNORMAL HIGH (ref 80.0–100.0)
Monocytes Absolute: 0.7 10*3/uL (ref 0.1–1.0)
Monocytes Relative: 10 %
Neutro Abs: 3.4 10*3/uL (ref 1.7–7.7)
Neutrophils Relative %: 52 %
Platelets: 229 10*3/uL (ref 150–400)
RBC: 3.81 MIL/uL — ABNORMAL LOW (ref 4.22–5.81)
RDW: 12.5 % (ref 11.5–15.5)
WBC: 6.6 10*3/uL (ref 4.0–10.5)
nRBC: 0 % (ref 0.0–0.2)

## 2018-10-22 LAB — BASIC METABOLIC PANEL
Anion gap: 10 (ref 5–15)
BUN: 12 mg/dL (ref 8–23)
CO2: 24 mmol/L (ref 22–32)
Calcium: 8.8 mg/dL — ABNORMAL LOW (ref 8.9–10.3)
Chloride: 108 mmol/L (ref 98–111)
Creatinine, Ser: 0.85 mg/dL (ref 0.61–1.24)
GFR calc Af Amer: >60 mL/min (ref >60)
GFR calc non Af Amer: >60 mL/min (ref >60)
Glucose, Bld: 113 mg/dL — ABNORMAL HIGH (ref 70–99)
Potassium: 3.9 mmol/L (ref 3.5–5.1)
Sodium: 142 mmol/L (ref 135–145)

## 2018-10-22 MED ORDER — SILVER SULFADIAZINE 1 % EX CREA: TOPICAL_CREAM | CUTANEOUS | 1 refills | Status: DC

## 2018-10-22 MED ORDER — SILVER SULFADIAZINE 1 % EX CREA: TOPICAL_CREAM | Freq: Once | CUTANEOUS | Status: DC

## 2018-10-22 MED ORDER — SULFAMETHOXAZOLE-TRIMETHOPRIM 800-160 MG PO TABS
1.0000 | ORAL_TABLET | Freq: Once | ORAL | Status: DC
  Filled 2018-10-22: qty 1

## 2018-10-22 MED ORDER — SULFAMETHOXAZOLE-TRIMETHOPRIM 800-160 MG PO TABS: 1.0000 | ORAL_TABLET | Freq: Two times a day (BID) | ORAL | 0 refills | Status: DC

## 2018-10-22 NOTE — ED Notes (Signed)
Pt caregiver states pt is homeless and was sleeping on church steps, states noticed rash starting when pt was outdoors sleeping.

## 2018-10-22 NOTE — ED Provider Notes (Signed)
[facility] Emergency Department Provider Note   ____________________________________________   First MD Initiated Contact with Patient 10/22/18 1150     (approximate)  I have reviewed the triage vital signs and the nursing notes.   HISTORY  Chief Complaint Rash    HPI Adam Good is a 73 y.o. male presents with edema erythema to the right elbow and forearm.  Patient states 2 weeks ago there was an abrasion secondary to a fall and he believes it is now  infected.  Patient has a history of eczema.  Patient is a poor historian.  Patient denies loss of function or loss of sensation.   Denies pain at this time.  No palliative measure for complaint.   Past Medical History:  Diagnosis Date  . Alcohol abuse   . Aortic stenosis   . Bipolar disorder ([place])   . Demand ischemia of myocardium ([place]) 01/2014   a. positive troponins in the setting of a fall during intoxication; b. Myoview 2/16: EF 65-70%, no evi of ischemia or infarct. No RWMA.; c. MV 9/16: mild ischemia in the mid anterolateral, mid anterior, basal anterolateral and basal anterior segments, patietn declined LHC  . Diastolic dysfunction    a. TTE 9/16: EF of 50-55%, Gr2DD, severely dilated LA, and moderate AS; b. TTE 3/19: EF of 50-55%, Gr1DD, mildly dilated left atrium, mildly calcified mitral leaflets, mild AS, mild TR, trace PR, RVSP 46 mmHg, trivial pericardial effusion  . Dyslipidemia   . Essential hypertension   . Gastroesophageal reflux disease   . HLD (hyperlipidemia)   . Sleep apnea   . Stroke [facility])     Patient Active Problem List   Diagnosis Date Noted  . Alcohol abuse with uncomplicated intoxication ([place]) 03/25/2018  . Elevated troponin 10/29/2017  . Dyslipidemia   . Essential hypertension   . Alcohol abuse   . Heart murmur   . Demand ischemia of myocardium ([place]) 01/17/2014    Past Surgical History:  Procedure Laterality Date  . HERNIA REPAIR    . KNEE SURGERY    . NM  MYOVIEW LTD  Jan 2009; 05/12/14   a) No ischemia/infarct. Normal EF - 73%; b) EF 65-70%, no evidence of ischemia or infarction. No RWMA.    Prior to Admission medications   Medication Sig Start Date End Date Taking? Authorizing Provider  albuterol (PROVENTIL HFA;VENTOLIN HFA) 108 (90 Base) MCG/ACT inhaler Inhale 1-2 puffs into the lungs every 6 (six) hours as needed for wheezing or shortness of breath.    [provider]  amLODipine (NORVASC) 5 MG tablet Take 5 mg by mouth daily.    [provider]  aspirin 81 MG chewable tablet Chew 81 mg by mouth daily.    [provider]  carvedilol (COREG) 3.125 MG tablet Take 3.125 mg by mouth 2 (two) times daily.    [provider]  lisinopril (PRINIVIL,ZESTRIL) 20 MG tablet Take 20 mg by mouth daily.    [provider]  rosuvastatin (CRESTOR) 20 MG tablet Take 20 mg by mouth at bedtime.    [provider]  silver sulfADIAZINE (SILVADENE) 1 % cream Apply to affected area daily 10/22/18   Sable Feil, PA-C  sulfamethoxazole-trimethoprim (BACTRIM DS) 800-160 MG tablet Take 1 tablet by mouth 2 (two) times daily. 10/22/18   Sable Feil, PA-C  thiamine 100 MG tablet Take 1 tablet (100 mg total) by mouth daily. 03/27/18   Bettey Costa, MD    Allergies Patient has  no known allergies.  Family History  Problem Relation Age of Onset  . Throat cancer Mother   . Cancer Father   . CAD Sister   . Hypertension Sister   . Diabetes Brother     Social History Social History   Tobacco Use  . Smoking status: Current Every Day Smoker    Packs/day: 0.50    Years: 57.00    Pack years: 28.50    Types: Cigarettes  . Smokeless tobacco: Current User    Types: Snuff  Substance Use Topics  . Alcohol use: Yes    Alcohol/week: 0.0 standard drinks    Comment: He has been a heavy alcohol drinker for > 30 yrs.  Stats that he quit 3 weeks ago.   . Drug use: No    Review of Systems Constitutional: No  fever/chills Eyes: No visual changes. ENT: No sore throat. Cardiovascular: Denies chest pain. Respiratory: Denies shortness of breath. Gastrointestinal: No abdominal pain.  No nausea, no vomiting.  No diarrhea.  No constipation. Genitourinary: Negative for dysuria. Musculoskeletal: Negative for back pain. Skin: Negative for rash.  Redness and swelling.  Cracked skin. Neurological: Negative for headaches, focal weakness or numbness. Psychiatric:  Alcohol abuse and bipolar. Endocrine:  Hyperlipidemia.  ____________________________________________   PHYSICAL EXAM:  VITAL SIGNS: ED Triage Vitals [10/22/18 1137]  Enc Vitals Group     BP (!) 144/79     Pulse Rate 96     Resp 16     Temp 98.4 F (36.9 C)     Temp Source Oral     SpO2 98 %     Weight      Height      Head Circumference      Peak Flow      Pain Score      Pain Loc      Pain Edu?      Excl. in [place]?     Constitutional: Alert and oriented. Well appearing and in no acute distress. Cardiovascular: Normal rate, regular rhythm. Grossly normal heart sounds.  Good peripheral circulation. Respiratory: Normal respiratory effort.  No retractions. Lungs CTAB. Neurologic:  Normal speech and language. No gross focal neurologic deficits are appreciated. No gait instability. Skin:  Skin is warm, dry and intact.  Patient elbow and forearm with edema and erythema.  Multiple small blisters and cracked skin.   Psychiatric: Mood and affect are normal. Speech and behavior are normal.  ____________________________________________   LABS (all labs ordered are listed, but only abnormal results are displayed)  Labs Reviewed  BASIC METABOLIC PANEL - Abnormal; Notable for the following components:      Result Value   Glucose, Bld 113 (*)    Calcium 8.8 (*)    All other components within normal limits  CBC WITH DIFFERENTIAL/PLATELET - Abnormal; Notable for the following components:   RBC 3.81 (*)    MCV 102.6 (*)    MCH 35.2 (*)     Eosinophils Absolute 0.6 (*)    All other components within normal limits   ____________________________________________  EKG   ____________________________________________  RADIOLOGY  ED MD interpretation:    Official radiology report(s): No results found.  ____________________________________________   PROCEDURES  Procedure(s) performed (including Critical Care):  Procedures   ____________________________________________   INITIAL IMPRESSION / ASSESSMENT AND PLAN / ED COURSE  As part of my medical decision making, I reviewed the following data within the [place]  [place] was evaluated in Emergency Department on 10/22/2018 for the symptoms described in the history of present illness. He was evaluated in the context of the global COVID-19 pandemic, which necessitated consideration that the patient might be at risk for infection with the SARS-CoV-2 virus that causes COVID-19. Institutional protocols and algorithms that pertain to the evaluation of patients at risk for COVID-19 are in a state of rapid change based on information released by regulatory bodies including the CDC and federal and state organizations. These policies and algorithms were followed during the patient's care in the ED.   Patient presents with edema erythema to the right elbow and forearm.  Patient has eczema and skin is "cracked" with multiple weeping blisters.  Area was clean and Silvadene applied.  Patient given discharge care instruction advised to take medication as directed.  Patient advised to follow-up 1 week with PCP at the [place].      ____________________________________________   FINAL CLINICAL IMPRESSION(S) / ED DIAGNOSES  Final diagnoses:  Erysipelas  Eczema, unspecified type     ED Discharge Orders         Ordered    sulfamethoxazole-trimethoprim (BACTRIM DS) 800-160 MG tablet  2 times daily     10/22/18 1354    silver sulfADIAZINE  (SILVADENE) 1 % cream     10/22/18 1354           Note:  This document was prepared using Dragon voice recognition software and may include unintentional dictation errors.    Sable Feil, PA-C 10/22/18 1401    Earleen Newport, MD 10/22/18 [phone]

## 2018-10-22 NOTE — ED Notes (Signed)
Pt leaving now and says his ride is leaving so he has to leave too. IV removed. D/c paperwork and RX given to pt. Pt refuses first dose of medications. RX for both of those meds given to pt and will fill and take at home.

## 2018-10-22 NOTE — ED Notes (Signed)
This RN gave discharge instructions to caregiver outside. Caregiver cursing at this RN and jerked papers out of hand. Verbalizes understanding of RX at pharmacy to pick up. Unable to sign esignature due to pt and caregiver being outside of lobby

## 2018-10-22 NOTE — ED Triage Notes (Signed)
Pt had mechanical fall x2wks ago and had RT elbow rash and skin tear. Pt caregiver states swelling and rash has worsening. NAD noted

## 2018-10-22 NOTE — Discharge Instructions (Addendum)
Follow discharge care instructions. 

## 2018-10-22 NOTE — ED Notes (Signed)
No signature due to pt leaving/refusing.

## 2021-03-04 IMAGING — DX PORTABLE CHEST - 1 VIEW
1 series · 1 of 1 positions shown · non-contrast
Comparison: 03/24/2018

CLINICAL DATA: Chest pain and shortness of breath.

EXAM:
PORTABLE CHEST 1 VIEW

[chest ap]
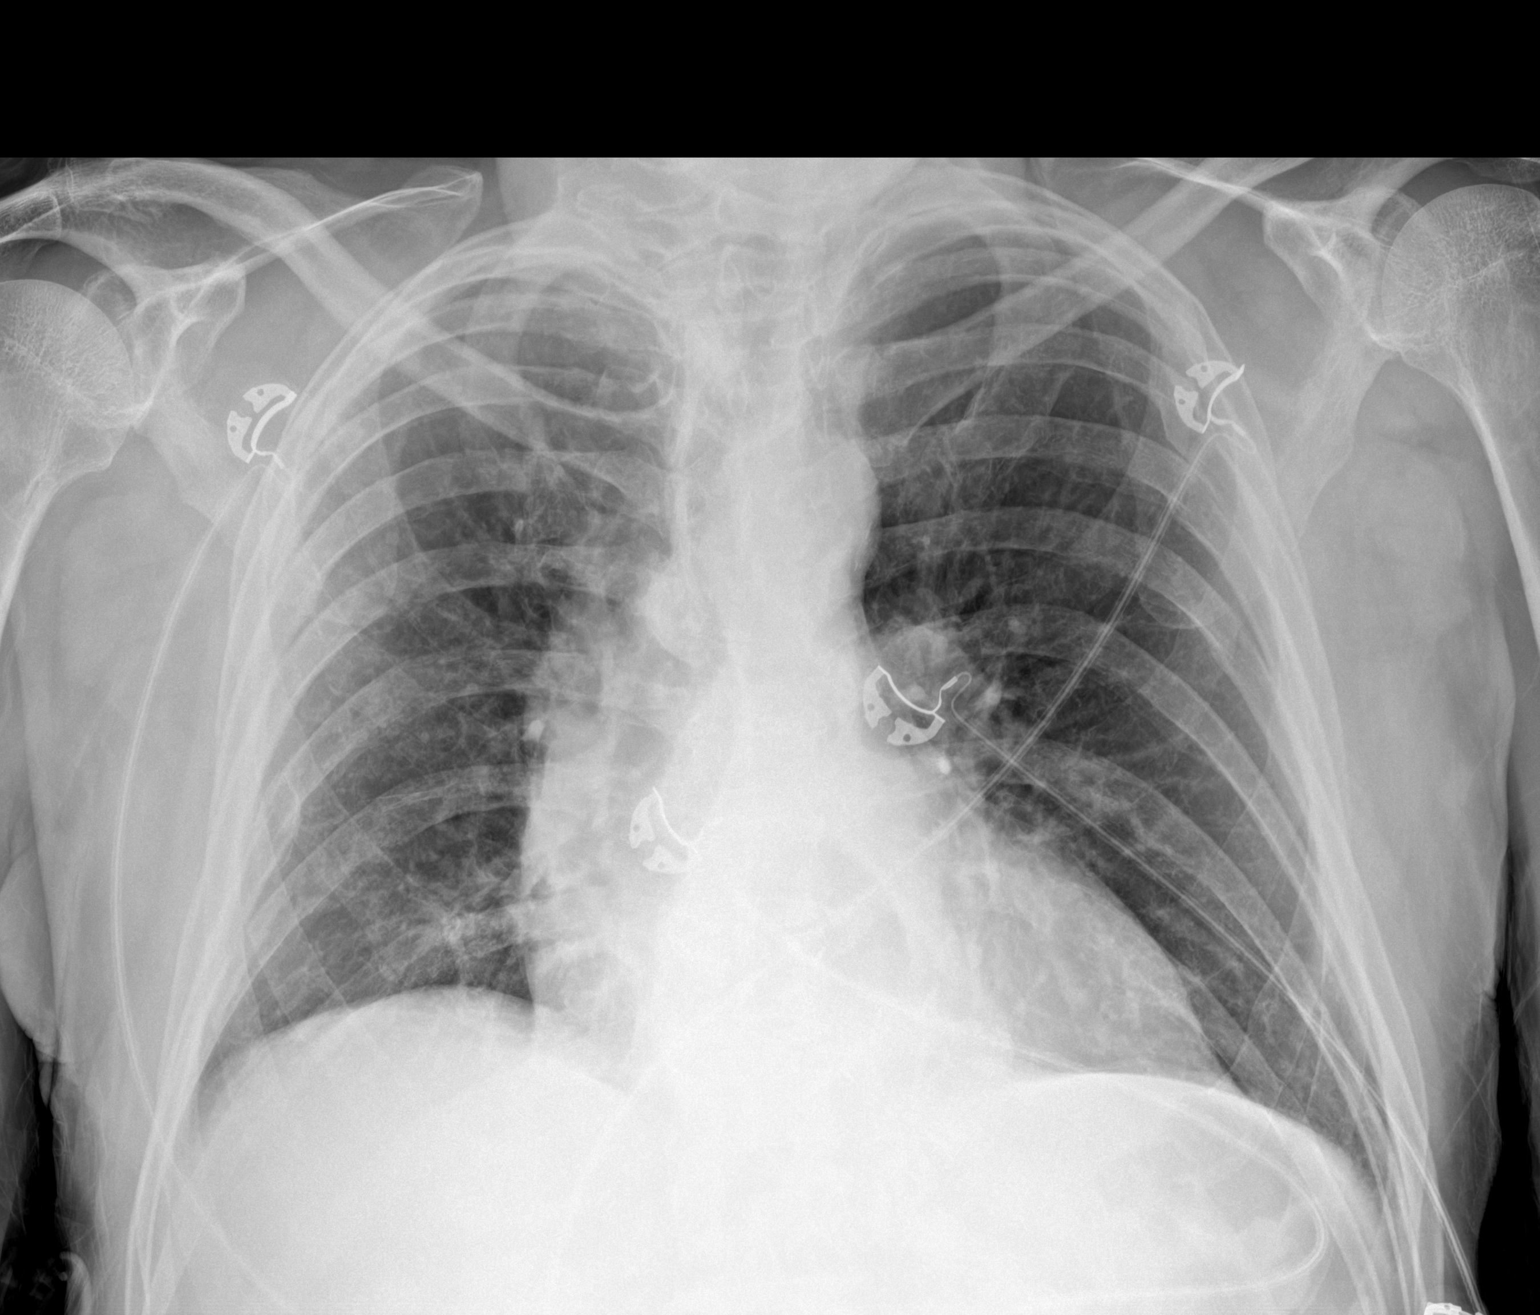

[1 of 1 positions shown; findings below may reference images not displayed]

FINDINGS: 7407 hours. Cardiopericardial silhouette is at upper limits of
normal for size. The lungs are clear without focal pneumonia, edema,
pneumothorax or pleural effusion. Interstitial markings are
diffusely coarsened with chronic features. Old posterior right rib
fractures evident. Hiatal hernia noted. Telemetry leads overlie the
chest.
IMPRESSION: Chronic interstitial changes without acute cardiopulmonary findings.

## 2021-06-08 ENCOUNTER — Ambulatory Visit (INDEPENDENT_AMBULATORY_CARE_PROVIDER_SITE_OTHER): Payer: Medicare Other | Admitting: Urology

## 2021-06-08 ENCOUNTER — Encounter: Payer: Self-pay | Admitting: Urology

## 2021-06-08 ENCOUNTER — Other Ambulatory Visit: Payer: Self-pay

## 2021-06-08 VITALS — BP 96/63 | HR 97 | Ht 69.0 in | Wt 141.0 lb

## 2021-06-08 DIAGNOSIS — R972 Elevated prostate specific antigen [PSA]: Secondary | ICD-10-CM

## 2021-06-08 NOTE — Patient Instructions (Addendum)
Transrectal Ultrasound-Guided Prostate Biopsy ?An ultrasound is a test that uses sound waves to take pictures of the inside of the body. During this test, a small device (probe) with a gel is put inside your butt (rectum). The probe will take pictures of your prostate to help guide the needle. A needle will be inserted to take samples of tissues for testing. ?This test may be done to check for changes in the prostate, like prostate swelling or cancer. ?Tell your doctor about: ?Any allergies you have. ?All medicines you are taking. ?Any problems you or family members have had with anesthetic medicines. ?Any bleeding problems you have. ?Any surgeries you have had. ?Any medical conditions you have. ?Any prostate infections you have had. ?What are the risks? ?Infection. ?Bleeding from the butt. ?Blood in the pee (urine). ?Allergic reactions to medicines. ?Damage to nearby parts. ?Trouble peeing. ?Nerve damage. This is usually temporary. ?What happens before the test? ?Medicines ?Ask your doctor about changing or stopping: ?Your normal medicines. ?Vitamins, herbs, and supplements. ?Over-the-counter medicines. ?Do not take aspirin or ibuprofen unless you are told to. ?General instructions ?Follow instructions from your doctor about what you cannot eat or drink. ?Liquid will be used to clear waste from your butt (enema). ?You may have a blood sample taken. ?You may have a pee sample taken. ?For your safety, your doctor may: ?Ask you to wash with a soap that kills germs. ?Give you antibiotic medicine. ?If you will be going home right after the test, plan to have a responsible adult: ?Take you home from the hospital or clinic. You will not be allowed to drive. ?Care for you for the time you are told. ?What happens during the test? ? ?An IV tube will be put into one of your veins. ?You will be given one or both of these: ?A medicine to help you relax. ?A medicine to numb the area. ?You will be placed on your left side. Your  knees will be bent toward your chest. ?A probe with gel on it will be placed in your butt. Pictures will be taken of your prostate and the area around it. ?Medicine will be used to numb your prostate. ?A needle will be placed in your butt and moved to your prostate. ?Prostate tissue will be taken out. ?The needle and probe will be taken out. ?The samples will be sent to a lab. ?The procedure may vary among doctors and hospitals. ?What happens after the test? ?You will be watched until you leave the hospital or clinic. This includes checking your blood pressure, heart rate, breathing rate, and blood oxygen level. ?You may have some pain in your butt. You will be given medicine for it. ?If you were given a sedative during the test, do not drive or use machines until your doctor says that it is safe. A sedative is a medicine that helps you relax. ?It is up to you to get the results of your test. Ask how to get your results when they are ready. ?Summary ?This test is usually done to check for prostate cancer. ?Before the test, ask your doctor about changing or stopping your medicines. ?You may have some pain in your butt. You will be given medicine for it. ?Plan to have a responsible adult take you home from the hospital or clinic. ?This information is not intended to replace advice given to you by your health care provider. Make sure you discuss any questions you have with your health care provider. ?Document Revised: 08/29/2020  Document Reviewed: 08/29/2020 ?Elsevier Patient Education ? [place]. ? ?

## 2021-06-08 NOTE — Progress Notes (Signed)
? ?06/08/2021 ?4:18 PM  ? ?Adam Good ?11-06-1945 ?[id number] ? ?Referring provider: Terrill Mohr, NP ?[name] ?[place],  [place] [address] ? ?Chief Complaint  ?Patient presents with  ? Elevated PSA  ? ? ?HPI: ?Adam Good is a 76 y.o. male referred for evaluation of an elevated PSA.  He presents today with a caregiver from his family home ? ?PSA 05/30/2021 elevated 194.74 ?No bothersome LUTS ?Denies dysuria, gross hematuria ?Denies flank, abdominal or pelvic pain ?No previous history of urologic problems ? ? ?PMH: ?Past Medical History:  ?Diagnosis Date  ? Alcohol abuse   ? Aortic stenosis   ? Bipolar disorder ([place])   ? Demand ischemia of myocardium ([place]) 01/2014  ? a. positive troponins in the setting of a fall during intoxication; b. Myoview 2/16: EF 65-70%, no evi of ischemia or infarct. No RWMA.; c. MV 9/16: mild ischemia in the mid anterolateral, mid anterior, basal anterolateral and basal anterior segments, patietn declined LHC  ? Diastolic dysfunction   ? a. TTE 9/16: EF of 50-55%, Gr2DD, severely dilated LA, and moderate AS; b. TTE 3/19: EF of 50-55%, Gr1DD, mildly dilated left atrium, mildly calcified mitral leaflets, mild AS, mild TR, trace PR, RVSP 46 mmHg, trivial pericardial effusion  ? Dyslipidemia   ? Essential hypertension   ? Gastroesophageal reflux disease   ? HLD (hyperlipidemia)   ? Sleep apnea   ? Stroke [facility])   ? ? ?Surgical History: ?Past Surgical History:  ?Procedure Laterality Date  ? HERNIA REPAIR    ? KNEE SURGERY    ? NM MYOVIEW LTD  Jan 2009; 05/12/14  ? a) No ischemia/infarct. Normal EF - 73%; b) EF 65-70%, no evidence of ischemia or infarction. No RWMA.  ? ? ?Home Medications:  ?Allergies as of 06/08/2021   ?No Known Allergies ?  ? ?  ?Medication List  ?  ? ?  ? Accurate as of June 08, 2021  4:18 PM. If you have any questions, ask your nurse or doctor.  ?  ?  ? ?  ? ?STOP taking these medications   ? ?silver sulfADIAZINE 1 % cream ?Commonly known as:  SILVADENE ?Stopped by: Abbie Sons, MD ?  ?sulfamethoxazole-trimethoprim 800-160 MG tablet ?Commonly known as: BACTRIM DS ?Stopped by: Abbie Sons, MD ?  ?thiamine 100 MG tablet ?Stopped by: Abbie Sons, MD ?  ? ?  ? ?TAKE these medications   ? ?Advair Diskus 250-50 MCG/ACT Aepb ?Generic drug: fluticasone-salmeterol ?Inhale 1 puff into the lungs 2 (two) times daily. ?  ?albuterol 108 (90 Base) MCG/ACT inhaler ?Commonly known as: VENTOLIN HFA ?Inhale 1-2 puffs into the lungs every 6 (six) hours as needed for wheezing or shortness of breath. ?  ?amLODipine 5 MG tablet ?Commonly known as: NORVASC ?Take 5 mg by mouth daily. ?  ?aspirin 81 MG chewable tablet ?Chew 81 mg by mouth daily. ?  ?carvedilol 3.125 MG tablet ?Commonly known as: COREG ?Take 3.125 mg by mouth 2 (two) times daily. ?  ?lisinopril 20 MG tablet ?Commonly known as: ZESTRIL ?Take 20 mg by mouth daily. ?  ?rosuvastatin 20 MG tablet ?Commonly known as: CRESTOR ?Take 20 mg by mouth at bedtime. ?  ? ?  ? ? ?Allergies: No Known Allergies ? ?Family History: ?Family History  ?Problem Relation Age of Onset  ? Throat cancer Mother   ? Cancer Father   ? CAD Sister   ? Hypertension Sister   ? Diabetes Brother   ? ? ?  Social History:  reports that he has been smoking cigarettes. He has a 28.50 pack-year smoking history. His smokeless tobacco use includes snuff. He reports current alcohol use. He reports that he does not use drugs. ? ? ?Physical Exam: ?BP 96/63   Pulse 97   Ht '5\' 9"'$  (1.753 m)   Wt 141 lb (64 kg)   BMI 20.82 kg/m?   ?Constitutional:  Alert and oriented, No acute distress. ?HEENT: [place] AT, moist mucus membranes.  Trachea midline, no masses. ?Cardiovascular: No clubbing, cyanosis, or edema. ?Respiratory: Normal respiratory effort, no increased work of breathing. ?GI: Abdomen is soft, nontender, nondistended, no abdominal masses ?GU: 80+ cc prostate which is abnormally firm.  Had difficulty tolerating DRE ? ? ?Assessment & Plan:   ? ?1.  Elevated PSA ?I discussed findings with patient and his caregiver.  He stated he was very hard of hearing and asked that I discussed primarily with caregiver.  PSA significantly elevated and suspicious for prostate cancer ?I recommended scheduling TRUS/biopsy of the prostate.  I do not think he would be able to tolerate insertion of a transrectal ultrasound probe in the office and recommend scheduling in same-day surgery under sedation. ?Potential complications were discussed including rectal/urinary tract bleeding and infection/sepsis ?He was unable to provide a urine today and will order a urinalysis and culture preoperatively ? ? ?Abbie Sons, MD ? ?[facility] ?[address], [address] ?[place], [place] [address] ?[phone] ? ?

## 2021-06-08 NOTE — H&P (View-Only) (Signed)
? ?06/08/2021 ?4:18 PM  ? ?Adam Good ?[birth year] ?[id number] ? ?Referring provider: Terrill Mohr, NP ?[facility] ?[place],  [place] [address] ? ?Chief Complaint  ?Patient presents with  ? Elevated PSA  ? ? ?HPI: ?Adam Good is a 76 y.o. male referred for evaluation of an elevated PSA.  He presents today with a caregiver from his family home ? ?PSA 05/30/2021 elevated 194.74 ?No bothersome LUTS ?Denies dysuria, gross hematuria ?Denies flank, abdominal or pelvic pain ?No previous history of urologic problems ? ? ?PMH: ?Past Medical History:  ?Diagnosis Date  ? Alcohol abuse   ? Aortic stenosis   ? Bipolar disorder ([place])   ? Demand ischemia of myocardium ([place]) 01/2014  ? a. positive troponins in the setting of a fall during intoxication; b. Myoview 2/16: EF 65-70%, no evi of ischemia or infarct. No RWMA.; c. MV 9/16: mild ischemia in the mid anterolateral, mid anterior, basal anterolateral and basal anterior segments, patietn declined LHC  ? Diastolic dysfunction   ? a. TTE 9/16: EF of 50-55%, Gr2DD, severely dilated LA, and moderate AS; b. TTE 3/19: EF of 50-55%, Gr1DD, mildly dilated left atrium, mildly calcified mitral leaflets, mild AS, mild TR, trace PR, RVSP 46 mmHg, trivial pericardial effusion  ? Dyslipidemia   ? Essential hypertension   ? Gastroesophageal reflux disease   ? HLD (hyperlipidemia)   ? Sleep apnea   ? Stroke [facility])   ? ? ?Surgical History: ?Past Surgical History:  ?Procedure Laterality Date  ? HERNIA REPAIR    ? KNEE SURGERY    ? NM MYOVIEW LTD  Jan 2009; 05/12/14  ? a) No ischemia/infarct. Normal EF - 73%; b) EF 65-70%, no evidence of ischemia or infarction. No RWMA.  ? ? ?Home Medications:  ?Allergies as of 06/08/2021   ?No Known Allergies ?  ? ?  ?Medication List  ?  ? ?  ? Accurate as of June 08, 2021  4:18 PM. If you have any questions, ask your nurse or doctor.  ?  ?  ? ?  ? ?STOP taking these medications   ? ?silver sulfADIAZINE 1 % cream ?Commonly known as:  SILVADENE ?Stopped by: Abbie Sons, MD ?  ?sulfamethoxazole-trimethoprim 800-160 MG tablet ?Commonly known as: BACTRIM DS ?Stopped by: Abbie Sons, MD ?  ?thiamine 100 MG tablet ?Stopped by: Abbie Sons, MD ?  ? ?  ? ?TAKE these medications   ? ?Advair Diskus 250-50 MCG/ACT Aepb ?Generic drug: fluticasone-salmeterol ?Inhale 1 puff into the lungs 2 (two) times daily. ?  ?albuterol 108 (90 Base) MCG/ACT inhaler ?Commonly known as: VENTOLIN HFA ?Inhale 1-2 puffs into the lungs every 6 (six) hours as needed for wheezing or shortness of breath. ?  ?amLODipine 5 MG tablet ?Commonly known as: NORVASC ?Take 5 mg by mouth daily. ?  ?aspirin 81 MG chewable tablet ?Chew 81 mg by mouth daily. ?  ?carvedilol 3.125 MG tablet ?Commonly known as: COREG ?Take 3.125 mg by mouth 2 (two) times daily. ?  ?lisinopril 20 MG tablet ?Commonly known as: ZESTRIL ?Take 20 mg by mouth daily. ?  ?rosuvastatin 20 MG tablet ?Commonly known as: CRESTOR ?Take 20 mg by mouth at bedtime. ?  ? ?  ? ? ?Allergies: No Known Allergies ? ?Family History: ?Family History  ?Problem Relation Age of Onset  ? Throat cancer Mother   ? Cancer Father   ? CAD Sister   ? Hypertension Sister   ? Diabetes Brother   ? ? ?  Social History:  reports that he has been smoking cigarettes. He has a 28.50 pack-year smoking history. His smokeless tobacco use includes snuff. He reports current alcohol use. He reports that he does not use drugs. ? ? ?Physical Exam: ?BP 96/63   Pulse 97   Ht '5\' 9"'$  (1.753 m)   Wt 141 lb (64 kg)   BMI 20.82 kg/m?   ?Constitutional:  Alert and oriented, No acute distress. ?HEENT: [place] AT, moist mucus membranes.  Trachea midline, no masses. ?Cardiovascular: No clubbing, cyanosis, or edema. ?Respiratory: Normal respiratory effort, no increased work of breathing. ?GI: Abdomen is soft, nontender, nondistended, no abdominal masses ?GU: 80+ cc prostate which is abnormally firm.  Had difficulty tolerating DRE ? ? ?Assessment & Plan:   ? ?1.  Elevated PSA ?I discussed findings with patient and his caregiver.  He stated he was very hard of hearing and asked that I discussed primarily with caregiver.  PSA significantly elevated and suspicious for prostate cancer ?I recommended scheduling TRUS/biopsy of the prostate.  I do not think he would be able to tolerate insertion of a transrectal ultrasound probe in the office and recommend scheduling in same-day surgery under sedation. ?Potential complications were discussed including rectal/urinary tract bleeding and infection/sepsis ?He was unable to provide a urine today and will order a urinalysis and culture preoperatively ? ? ?Abbie Sons, MD ? ?[facility] ?[address], [address] ?[place], [place] [address] ?[phone] ? ?

## 2021-06-11 ENCOUNTER — Encounter: Payer: Self-pay | Admitting: Urology

## 2021-06-11 ENCOUNTER — Other Ambulatory Visit: Payer: Self-pay | Admitting: Urology

## 2021-06-11 DIAGNOSIS — R972 Elevated prostate specific antigen [PSA]: Secondary | ICD-10-CM

## 2021-06-11 NOTE — Progress Notes (Signed)
Surgical Physician Order Form [facility] [place] ? ?* Scheduling expectation : ASAP ? ?*Length of Case: 30 min ? ?*Clearance needed: per Gaspar Bidding ? ?*Anticoagulation Instructions: N/A ? ?*Aspirin Instructions: Hold Aspirin ? ?*Post-op visit Date/Instructions:  1-2 week with pathology review ? ?*Diagnosis: Elevated PSA ? ?*Procedure:  Prostate Biopsy (30940) TRUS (76808) [place] UPJSR(15945) Nerve block (85929)- no nerve block ? ? ?Additional orders: N/A ? ?-Admit type: OUTpatient ? ?-Anesthesia: MAC ? ?-VTE Prophylaxis Standing Order SCD?s    ?   ?Other:  ? ?-Standing Lab Orders Per Anesthesia   ? ?Lab other: UA&Urine Culture ? ?-Standing Test orders EKG/Chest x-ray per Anesthesia      ? ?Test other:  ? ?- Medications:  Ceftriaxone(Rocephin) 1gm IV ? ?-Other orders:  N/A ? ?Madelynn Done [phone] (group home head) ? ? ? ?  ? ?

## 2021-06-14 ENCOUNTER — Other Ambulatory Visit: Payer: Self-pay

## 2021-06-14 ENCOUNTER — Other Ambulatory Visit: Payer: Self-pay | Admitting: Urology

## 2021-06-14 DIAGNOSIS — R972 Elevated prostate specific antigen [PSA]: Secondary | ICD-10-CM

## 2021-06-15 ENCOUNTER — Encounter: Payer: Self-pay | Admitting: Cardiovascular Disease

## 2021-06-15 ENCOUNTER — Ambulatory Visit (INDEPENDENT_AMBULATORY_CARE_PROVIDER_SITE_OTHER): Payer: Medicare Other | Admitting: Cardiovascular Disease

## 2021-06-15 ENCOUNTER — Telehealth: Payer: Self-pay

## 2021-06-15 ENCOUNTER — Encounter: Payer: Self-pay | Admitting: Urology

## 2021-06-15 ENCOUNTER — Other Ambulatory Visit: Payer: Medicare Other

## 2021-06-15 ENCOUNTER — Ambulatory Visit (INDEPENDENT_AMBULATORY_CARE_PROVIDER_SITE_OTHER): Payer: Medicare Other

## 2021-06-15 VITALS — BP 100/68 | HR 87 | Ht 69.0 in | Wt 125.4 lb

## 2021-06-15 DIAGNOSIS — R972 Elevated prostate specific antigen [PSA]: Secondary | ICD-10-CM

## 2021-06-15 DIAGNOSIS — F1010 Alcohol abuse, uncomplicated: Secondary | ICD-10-CM | POA: Diagnosis not present

## 2021-06-15 DIAGNOSIS — N429 Disorder of prostate, unspecified: Secondary | ICD-10-CM

## 2021-06-15 DIAGNOSIS — I350 Nonrheumatic aortic (valve) stenosis: Secondary | ICD-10-CM

## 2021-06-15 DIAGNOSIS — R634 Abnormal weight loss: Secondary | ICD-10-CM

## 2021-06-15 DIAGNOSIS — I959 Hypotension, unspecified: Secondary | ICD-10-CM

## 2021-06-15 DIAGNOSIS — Z87891 Personal history of nicotine dependence: Secondary | ICD-10-CM | POA: Diagnosis not present

## 2021-06-15 LAB — ECHOCARDIOGRAM COMPLETE
AR max vel: 0.56 cm2
AV Area VTI: 0.66 cm2
AV Area mean vel: 0.57 cm2
AV Mean grad: 44.5 mmHg
AV Peak grad: 88.4 mmHg
Ao pk vel: 4.7 m/s
Area-P 1/2: 2.96 cm2
Height: 69 in
Weight: 2006 oz

## 2021-06-15 MED ORDER — PERFLUTREN LIPID MICROSPHERE
1.0000 mL | INTRAVENOUS | Status: AC | PRN
  Administered 2021-06-15: 2 mL via INTRAVENOUS

## 2021-06-15 NOTE — Progress Notes (Signed)
Cardiology Office Note ? ?Date:  06/15/2021  ? ?ID:  Adam Good, DOB 05-Jan-1946, MRN [id number] ? ?PCP:  [facility]  ? ?Chief Complaint  ?Patient presents with  ? Pre op clearance   ?  Patient will need to have a PROSTATE BIOPSY. "Doing well." Medications reviewed by the patient's medication list.   ? ? ?HPI:  ?Adam Good is a 76 year old gentleman with past medical history of ?Aortic stenosis, moderate in 2020 ?Moderate mitral valve regurgitation ?Bipolar disorder ?Hypertension ?Alcohol abuse ?Who presents by referral from urology for preop clearance prostate surgery ? ?Previously seen by cardiology 2016 ? ?Recently noted to have Elevated PSA 194 noted May 30, 2021 ?Seen by urology, ?Scheduled to have prostate biopsy ? ?He is very hard of hearing, much of the history obtained from his caretaker who presents from his home ?He denies significant shortness of breath, better since he stopped smoking 30 years ago ?Denies chest pain concerning for angina ?No leg swelling, no PND ?Caretaker reports he has been losing weight ?Unclear if blood pressure measurements are being obtained from nursing facility ?Blood pressure low today, was also low June 08, 2021 96/63 on that visit ? ?EKG personally reviewed by myself on todays visit ?Normal sinus rhythm rate 87 bpm T wave abnormality V1 through V6, 1 and aVL, possible LVH with repol ? ?Prior records reviewed ?Echocardiogram January 2020 aortic valve stenosis, moderate with mean gradient of 30 ?Moderate MR ?Normal ejection fraction ? ?ad[facility] to [facility] after sustaining an unwitnessed fall on concrete - & hit the back of his head on the ground. Apparently he been drinking all abdominal pain and noted some chest pain. He was found to have very high blood alcohol level  ? ?PMH:   has a past medical history of Alcohol abuse, Aortic stenosis (06/04/2017), Bipolar disorder ([place]), Cardiac murmur, Demand ischemia of myocardium ([place]) (05/90),  Diastolic dysfunction, Dyslipidemia, Essential hypertension, Gastroesophageal reflux disease, HLD (hyperlipidemia), NSTEMI (non-ST elevated myocardial infarction) ([place]) (11/17/2014), Sleep apnea, and Stroke ([place]). ? ?PSH:    ?Past Surgical History:  ?Procedure Laterality Date  ? HERNIA REPAIR    ? KNEE SURGERY    ? NM MYOVIEW LTD  Jan 2009; 05/12/14  ? a) No ischemia/infarct. Normal EF - 73%; b) EF 65-70%, no evidence of ischemia or infarction. No RWMA.  ? ? ?Current Outpatient Medications  ?Medication Sig Dispense Refill  ? acetaminophen (TYLENOL) 500 MG tablet Take 500 mg by mouth every 6 (six) hours as needed.    ? ADVAIR DISKUS 250-50 MCG/ACT AEPB Inhale 1 puff into the lungs 2 (two) times daily.    ? aspirin 81 MG chewable tablet Chew 81 mg by mouth daily.    ? atorvastatin (LIPITOR) 80 MG tablet Take 80 mg by mouth daily.    ? Dextromethorphan-guaiFENesin (ROBAFEN DM) 10-100 MG/5ML liquid Take 5 mLs by mouth every 12 (twelve) hours.    ? hydrochlorothiazide (MICROZIDE) 12.5 MG capsule Take 12.5 mg by mouth daily.    ? lisinopril (ZESTRIL) 40 MG tablet Take 40 mg by mouth daily.    ? loperamide (IMODIUM) 2 MG capsule Take by mouth as needed for diarrhea or loose stools.    ? magnesium hydroxide (MILK OF MAGNESIA) 400 MG/5ML suspension Take 30 mLs by mouth daily as needed for mild constipation.    ? megestrol (MEGACE) 20 MG tablet Take 20 mg by mouth daily.    ? mirtazapine (REMERON) 15 MG tablet Take 15 mg by  mouth at bedtime.    ? Neomycin-Bacitracin-Polymyxin (HCA TRIPLE ANTIBIOTIC OINTMENT EX) Apply topically.    ? thiamine (VITAMIN B-1) 100 MG tablet Take 100 mg by mouth daily.    ? albuterol (PROVENTIL HFA;VENTOLIN HFA) 108 (90 Base) MCG/ACT inhaler Inhale 1-2 puffs into the lungs every 6 (six) hours as needed for wheezing or shortness of breath. (Patient not taking: Reported on 06/15/2021)    ? ?No current facility-administered medications for this visit.  ? ? ? ?Allergies:   Patient has no known allergies.   ? ?Social History:  The patient  reports that he has been smoking cigarettes. He has a 28.50 pack-year smoking history. His smokeless tobacco use includes snuff. He reports current alcohol use. He reports that he does not use drugs.  ? ?Family History:   family history includes CAD in his sister; Cancer in his father; Diabetes in his brother; Hypertension in his sister; Throat cancer in his mother.  ? ? ?Review of Systems: ?Review of Systems  ?Constitutional: Negative.   ?HENT: Negative.    ?Respiratory: Negative.    ?Cardiovascular: Negative.   ?Gastrointestinal: Negative.   ?Musculoskeletal: Negative.   ?Neurological: Negative.   ?Psychiatric/Behavioral: Negative.    ?All other systems reviewed and are negative. ? ? ?PHYSICAL EXAM: ?VS:  BP 100/68 (BP Location: Right Arm, Patient Position: Sitting, Cuff Size: Normal)   Pulse 87   Ht '5\' 9"'$  (1.753 m)   Wt 125 lb 6 oz (56.9 kg)   BMI 18.51 kg/m?  , BMI Body mass index is 18.51 kg/m?. ?GEN: Well nourished, well developed, in no acute distress ?HEENT: normal ?Neck: no JVD, carotid bruits, or masses ?Cardiac: RRR; 2/6 systolic ejection murmur appreciated right sternal border , ?no  rubs, or gallops,no edema  ?Respiratory:  clear to auscultation bilaterally, normal work of breathing ?GI: soft, nontender, nondistended, + BS ?MS: no deformity or atrophy ?Skin: warm and dry, no rash ?Neuro:  Strength and sensation are intact ?Psych: euthymic mood, full affect ? ?Recent Labs: ?No results found for requested labs within last 8760 hours.  ? ? ?Lipid Panel ?Lab Results  ?Component Value Date  ? CHOL 170 10/30/2017  ? HDL 40 (L) 10/30/2017  ? LDLCALC 114 (H) 10/30/2017  ? TRIG 81 10/30/2017  ? ?  ? ?Wt Readings from Last 3 Encounters:  ?06/15/21 125 lb 6 oz (56.9 kg)  ?06/08/21 141 lb (64 kg)  ?09/26/18 141 lb (64 kg)  ?  ? ?ASSESSMENT AND PLAN: ? ?Problem List Items Addressed This Visit   ? ? Alcohol abuse  ? ?Other Visit Diagnoses   ? ? Aortic valve stenosis, etiology of  cardiac valve disease unspecified    -  Primary  ? Relevant Medications  ? atorvastatin (LIPITOR) 80 MG tablet  ? lisinopril (ZESTRIL) 40 MG tablet  ? hydrochlorothiazide (MICROZIDE) 12.5 MG capsule  ? History of smoking      ? Hypotension, unspecified hypotension type      ? Relevant Medications  ? atorvastatin (LIPITOR) 80 MG tablet  ? lisinopril (ZESTRIL) 40 MG tablet  ? hydrochlorothiazide (MICROZIDE) 12.5 MG capsule  ? Weight loss      ? Prostate disease      ? ?  ? ?Preop cardiovascular evaluation ?Scheduled for prostate biopsy with urology ?Acceptable risk for procedure ?Blood pressure low, will recommend he hold HCTZ ?Also on lisinopril 40 daily ?Suggested if blood pressure remains low, additional changes may be needed such as decreasing the dose of lisinopril ?Caretaker reports  there is a physician that oversees the nursing facility ? ?Alcohol abuse ?He reports this is in remission ? ?Smoker ?Reports he no longer smokes ? ?Prostate disease ?Markedly elevated PSA, procedure scheduled as above April 11 with urology ? ?Aortic valve stenosis ?Was moderate in 2020, repeat echocardiogram has been ordered, he reports he is asymptomatic ?Addendum: Repeat echocardiogram performed showing progression of aortic valve stenosis, now severe ?Given low risk nature of prostate procedure, still acceptable risk to proceed ?-We will refer to [facility] group for discussion concerning his aortic valve management options ? ?Hyperlipidemia ?Currently on Lipitor ?No recent lipid panel available, will defer to primary care ? ? Total encounter time more than 60 minutes ? Greater than 50% was spent in counseling and coordination of care with the patient ? ? ? ?Signed, ?Esmond Plants, M.D., Ph.D. ?[facility] [place], [place] ?[phone] ?

## 2021-06-15 NOTE — Telephone Encounter (Signed)
I spoke with Caregiver Melida Quitter. We have discussed possible surgery dates and Tuesday April 11th, 2023 was agreed upon by all parties. Patient given information about surgery date, what to expect pre-operatively and post operatively.  ?We discussed that a Pre-Admission Testing office will be calling to set up the pre-op visit that will take place prior to surgery, and that these appointments are typically done over the phone with a Pre-Admissions RN. ? ? Informed patient that our office will communicate any additional care to be provided after surgery. Patients questions or concerns were discussed during our call. Advised to call our office should there be any additional information, questions or concerns that arise. Patient verbalized understanding.  ? ? ?

## 2021-06-15 NOTE — Patient Instructions (Addendum)
Echo for aortic valve stenosis ? ? ?Medication Instructions:  ? ?Stop the HCTZ (blood pressure is low) ?Monitor blood pressure at home,  ?If it continues to run low,  ?We may need to decrease the dose of the lisinopril ? ?If you need a refill on your cardiac medications before your next appointment, please call your pharmacy.  ? ?Lab work: ?No new labs needed ? ?Testing/Procedures: ? ?Your physician has requested that you have an echocardiogram prior to 06/27/21. Echocardiography is a painless test that uses sound waves to create images of your heart. It provides your doctor with information about the size and shape of your heart and how well your heart?s chambers and valves are working. This procedure takes approximately one hour. There are no restrictions for this procedure.  ? ?Follow-Up: ?At [facility], you and your health needs are our priority.  As part of our continuing mission to provide you with exceptional heart care, we have created designated Provider Care Teams.  These Care Teams include your primary Cardiologist (physician) and Advanced Practice Providers (APPs -  Physician Assistants and Nurse Practitioners) who all work together to provide you with the care you need, when you need it. ? ?You will need a follow up appointment in 12 months ? ?Providers on your designated Care Team:   ?Murray Hodgkins, NP ?Christell Faith, PA-C ?Cadence Kathlen Mody, PA-C ? ?COVID-19 Vaccine Information can be found at: [email or web address] For questions related to vaccine distribution or appointments, please email [email or web address] or call [phone].  ? ?

## 2021-06-15 NOTE — Progress Notes (Signed)
[facility] Posting Form  ? ?Surgery Date/Time: Date: 06/27/2021 ? ?Surgeon: Dr. John Giovanni, MD ? ?Surgery Location: Day Surgery ? ?Inpt ( No  )   Outpt (Yes)   Obs ( No  )  ? ?Diagnosis: Elevated PSA R97.20 ? ?-CPT: [place], [id number], [id number], [phone] ? ?Surgery: Transrectal Ultrasound Guided Prostate Biopsy  ? ?Stop Anticoagulations: No, hold Aspirin ? ?Cardiac/Medical/Pulmonary Clearance needed: yes, Bryan NP with Anesthesia will send.  ? ?Clearance needed from Dr: Rockey Situ ? ?Clearance request sent on: Date: 06/15/21 ? ? ?*Orders entered into EPIC  Date: 06/15/21  ? ?*Case booked in [place]  Date: 06/14/2021 ? ?*Notified pt of Surgery: Date: 06/14/2021 ? ?PRE-OP UA & CX: yes, will obtain on 06/15/2021 ? ?*Placed into Prior Authorization Work Fabio Bering Date: 06/15/21 ? ? ?Assistant/laser/rep:No ? ? ? ? ? ? ? ? ? ? ? ? ? ? ? ?

## 2021-06-16 ENCOUNTER — Telehealth: Payer: Self-pay | Admitting: *Deleted

## 2021-06-16 ENCOUNTER — Telehealth: Payer: Self-pay | Admitting: Cardiology

## 2021-06-16 DIAGNOSIS — I350 Nonrheumatic aortic (valve) stenosis: Secondary | ICD-10-CM

## 2021-06-16 LAB — URINALYSIS, COMPLETE
Bilirubin, UA: NEGATIVE
Glucose, UA: NEGATIVE
Leukocytes,UA: NEGATIVE
Nitrite, UA: NEGATIVE
Protein,UA: NEGATIVE
RBC, UA: NEGATIVE
Specific Gravity, UA: 1.015 (ref 1.005–1.030)
Urobilinogen, Ur: 1 mg/dL (ref 0.2–1.0)
pH, UA: 6 (ref 5.0–7.5)

## 2021-06-16 LAB — MICROSCOPIC EXAMINATION: RBC, Urine: NONE SEEN /hpf (ref 0–2)

## 2021-06-16 NOTE — Telephone Encounter (Signed)
Called and spoke with pt's caregiver, Madelynn Done, approved per DPR. ?Notified of echo results and Dr. Donivan Scull recc below.  ? ?Referral has been placed to structural heart/TAVR team.  ?Madelynn Done voiced understanding and requests echo and ov note be faxed to him @ [phone]. ?Fax sent per request with confirmation received. ? ?Confirmed with Madelynn Done that Dr. Rockey Situ states pt is still acceptable risk to proceed with prostate biopsy.  ? ?Forwarded to Harland German and Dr. Donivan Scull nurse. ?

## 2021-06-16 NOTE — Telephone Encounter (Signed)
New structural heart consult from Dr. Rockey Situ. Attempted to call the patient at two separate numbers without answer to schedule new consultation appointment. Left voice message and will try again at a later time.  ? ?Kathyrn Drown NP-C ?Structural Heart Team  ?Pager: [phone] ?Phone: [phone] ? ?

## 2021-06-16 NOTE — Telephone Encounter (Signed)
-----   Message from Minna Merritts, MD sent at 06/16/2021 11:32 AM EDT ----- ?Echocardiogram results reviewed, severe aortic valve stenosis, this has progressed from 2020 ?Can we refer him to structural heart/TAVR team in [facility] for further discussion concerning his various treatment options ?Thx ?TG ?

## 2021-06-19 LAB — CULTURE, URINE COMPREHENSIVE

## 2021-06-20 ENCOUNTER — Other Ambulatory Visit
Admission: RE | Admit: 2021-06-20 | Discharge: 2021-06-20 | Disposition: A | Discharge status: Home / Self Care | Payer: Medicare Other | Source: Ambulatory Visit | Attending: Urology | Admitting: Urology

## 2021-06-20 VITALS — Ht 69.0 in | Wt 120.0 lb

## 2021-06-20 DIAGNOSIS — I10 Essential (primary) hypertension: Secondary | ICD-10-CM

## 2021-06-20 DIAGNOSIS — Z01812 Encounter for preprocedural laboratory examination: Secondary | ICD-10-CM

## 2021-06-20 HISTORY — DX: Chronic obstructive pulmonary disease, unspecified: J44.9

## 2021-06-20 HISTORY — DX: Unspecified dementia, unspecified severity, without behavioral disturbance, psychotic disturbance, mood disturbance, and anxiety: F03.90

## 2021-06-20 NOTE — Patient Instructions (Addendum)
?Your procedure is scheduled on: Tuesday June 27, 2021 ?Report to Day Surgery inside [facility] [place] 2nd floor, stop by admissions desk before getting on elevator.  ?To find out your arrival time please call [phone] between 1PM - 3PM on Monday June 26, 2021. ? ?Remember: Instructions that are not followed completely may result in serious medical risk,  ?up to and including death, or upon the discretion of your surgeon and anesthesiologist your  ?surgery may need to be rescheduled.  ? ?  _X__ 1. Do not eat food or drink fluids after midnight the night before your procedure. ?                No chewing gum or hard candies.  ? ?__X__2.  On the morning of surgery brush your teeth with toothpaste and water, you ?               may rinse your mouth with mouthwash if you wish.  Do not swallow any toothpaste or mouthwash. ?   ? _X__ 3.  No Alcohol for 24 hours before or after surgery. ? ? _X__ 4.  Do Not Smoke or use e-cigarettes For 24 Hours Prior to Your Surgery. ?                Do not use any chewable tobacco products for at least 6 hours prior to ?                Surgery. ? ?_X__  5.  Do not use any recreational drugs (marijuana, cocaine, heroin, ecstasy, MDMA or other) ?               For at least one week prior to your surgery.  Combination of these drugs with anesthesia ?               May have life threatening results. ? ?____  6.  Bring all medications with you on the day of surgery if instructed.  ? ?__X__  7.  Notify your doctor if there is any change in your medical condition  ?    (cold, fever, infections). ?    ?Do not wear jewelry, make-up, hairpins, clips or nail polish. ?Do not wear lotions, powders, or perfumes. You may wear deodorant. ?Do not shave 48 hours prior to surgery. Men may shave face and neck. ?Do not bring valuables to the hospital.   ? ?[facility] is not responsible for any belongings or valuables. ? ?Contacts, dentures or bridgework may not be worn into  surgery. ?Leave your suitcase in the car. After surgery it may be brought to your room. ?For patients admitted to the hospital, discharge time is determined by your ?treatment team. ?  ?Patients discharged the day of surgery will not be allowed to drive home.   ?Make arrangements for someone to be with you for the first 24 hours of your ?Same Day Discharge. ? ? ?__X__ Take these medicines the morning of surgery with A SIP OF WATER:  ? ? 1. None  ? 2.  ? 3.  ? 4. ? 5. ? 6. ? ?____ Fleet Enema (as directed)  ? ?____ Use CHG Soap (or wipes) as directed ? ?____ Use Benzoyl Peroxide Gel as instructed ? ?__X__ Use inhalers on the day of surgery ? ADVAIR DISKUS 250-50 MCG/ACT AEPB ? ?____ Stop metformin 2 days prior to surgery   ? ?____ Take 1/2 of usual insulin dose the night before surgery. No insulin the  morning ?         of surgery.  ? ?__X__ Stop aspirin '81mg'$  7 days before your surgery as instructed by your doctor.  ? ?__X__ One Week prior to surgery- Stop Anti-inflammatories such as Ibuprofen, Aleve, Advil, Motrin, meloxicam (MOBIC), diclofenac, etodolac, ketorolac, Toradol, Daypro, piroxicam, Goody's or BC powders. OK TO USE TYLENOL IF NEEDED ?  ?__X__ Stop supplements until after surgery.   ? ?____ Bring C-Pap to the hospital.  ? ? ?If you have any questions regarding your pre-procedure instructions,  ?Please call Pre-admit Testing at [phone] ?

## 2021-06-21 ENCOUNTER — Other Ambulatory Visit: Payer: Medicare Other

## 2021-06-21 ENCOUNTER — Encounter: Payer: Self-pay | Admitting: Urology

## 2021-06-21 NOTE — Progress Notes (Addendum)
?Perioperative Services ? ?Pre-Admission/Anesthesia Testing Clinical Review ? ?Date: 06/23/21 ? ?Patient Demographics:  ?Name: Adam Good ?DOB:   1945/09/24 ?MRN:   [id number] ? ?Planned Surgical Procedure(s):  ? ? Case: [id number] Date/Time: 06/27/21 0715  ? Procedures:  ?    PROSTATE BIOPSY ?    TRANSRECTAL ULTRASOUND  ? Anesthesia type: Monitor Anesthesia Care  ? Pre-op diagnosis: Elevated Prostate Specific Antigen  ? Location: [facility] OR ROOM 10 / [facility] ORS FOR ANESTHESIA GROUP  ? Surgeons: Abbie Sons, MD  ? ?NOTE: Available PAT nursing documentation and vital signs have been reviewed. Clinical nursing staff has updated patient's PMH/PSHx, current medication list, and drug allergies/intolerances to ensure comprehensive history available to assist in medical decision making as it pertains to the aforementioned surgical procedure and anticipated anesthetic course. Extensive review of available clinical information performed. [place] PMH and PSHx updated with any diagnoses/procedures that  may have been inadvertently omitted during his intake with the pre-admission testing department's nursing staff. ? ?Clinical Discussion:  ?Adam Good is a 76 y.o. male who is submitted for pre-surgical anesthesia review and clearance prior to him undergoing the above procedure. Patient is a Former Smoker (28.5 pack years; quit 02/2021). Pertinent PMH includes: CAD, NSTEMI, aortic stenosis, diastolic dysfunction, cardiac murmur, HTN, HLD, GERD (no daily Tx), OSA (does not use nocturnal PAP therapy), COPD, ETOH abuse, dementia, bipolar disorder, HOH.  ? ?Patient is followed by cardiology Rockey Situ, MD). He was last seen in the cardiology clinic on 06/15/2021; notes reviewed.  At the time of this clinic visit, patient doing well overall from a cardiovascular perspective.  He denied any episodes of chest pain, short of breath, PND, orthopnea, palpitations, significant peripheral edema, vertiginous symptoms, or  presyncope/syncope.  Exam revealed a grade II/VI systolic ejection murmur appreciated at the RSB.  Patient with a past medical history significant for cardiovascular diagnoses. ? ?Patient suffered an NSTEMI on 11/17/2014.  TTE at that time revealed a normal left ventricular ejection fraction of 50-55% with restrictive filling pattern (G3DD), a severely dilated left atrium, and moderate aortic valve stenosis.  Patient underwent stress testing that demonstrated mild ischemia in the mid anterolateral, mid anterior, basal and lateral, and basal anterior segments.  Cardiac catheterization was recommended, however patient declined opting for medical management. ? ?Last myocardial perfusion imaging study performed on 10/30/2017 revealed a normal left ventricular systolic function with an EF 58%.  Resting ECG revealed LVH and repolarization abnormality.  There was a T wave abnormality noted in the precordial and inferior leads.  Study did not demonstrate any evidence of significant ischemia, and therefore determined to be low risk. ? ?Last TTE was performed on 06/15/2021 revealing normal left ventricular systolic function with an EF of 60 to 65%. Diastolic Doppler parameters consistent with abnormal relaxation (G1DD).  PASP elevated; estimated RVSP 52.3.  There was severe calcification of the aortic valve causing severe stenosis; mean gradient 44.5 mmHg; AVA (VTI) = 0.66 cm?.  Degree of stenosis and aortic valve progressive from previous TTE performed back in 03/2018, at which time transvalvular gradient was 30 mmHg.  Patient referred to structural cardiology clinic for further evaluation and consideration of management options, which will ultimately be a TAVR procedure.  ? ?Blood pressure well controlled at 100/68 on currently prescribed ACEi monotherapy.  Patient is on a statin for his HLD.  It is pertinent to note that patient is taking and appetite stimulant (megestrol) for weight loss, which carried the increased risk  for thrombotic events.  Patient is not diabetic. He has an OSAH diagnosis, however does not currently utilize nocturnal PAP therapy.  Functional capacity, as defined by DASI, is documented as being >/= 4 METS.  No changes were made to his medication regimen.  Given evidence of progression of patient's aortic valve stenosis to severe, patient has been referred to structural heart group; appointment pending.  Patient to follow-up with primary outpatient cardiologist at defined intervals for ongoing management of his cardiovascular diagnoses. ? ?Adam Good is scheduled for a TRANSRECTAL ULTRASOUND AND PROSTATE BIOPSY on 06/27/2021 with Dr. John Giovanni, MD.  Given patient's past medical history significant for cardiovascular diagnoses, presurgical cardiac clearance was sought by the PAT team.  Per cardiology, "recent echocardiogram from performed showing progression of aortic valve stenosis to now severe.  However, given low risk nature of prostate procedure, patient remains at an ACCEPTABLE risk to proceed with planned procedure without further cardiovascular testing". In review of his medication reconciliation, it is noted the patient is on daily antiplatelet therapy. He has been instructed on recommendations from his cardiologist for holding his daily low-dose ASA for 7 days prior to his procedure with plans to restart as soon as postoperative bleeding risk felt to be minimized by his primary attending surgeon. The patient is aware that his last dose of ASA should be on 06/19/2021. ? ?Patient denies previous perioperative complications with anesthesia in the past. In review of the EMR, there are no records available for review regarding patient's past surgical/anesthetic courses within the [facility] system. ? ? ?  06/20/2021  ?  9:53 AM 06/15/2021  ?  2:36 PM 06/15/2021  ?  2:20 PM  ?Vitals with BMI  ?Height '5\' 9"'$  '5\' 9"'$  '5\' 9"'$   ?Weight 120 lbs 125 lbs 6 oz 125 lbs 6 oz  ?BMI 17.71 18.51 18.51  ?Systolic  [phone]   ?Diastolic  68 70  ?Pulse  87 87  ? ? ?Providers/Specialists:  ? ?NOTE: Primary physician provider listed below. Patient may have been seen by APP or partner within same practice.  ? ?PROVIDER ROLE / SPECIALTY LAST OV  ?Abbie Sons, MD Urology (Surgeon) 06/11/2021  ?[facility] [place] Primary Care Provider ???  ?Ida Rogue, MD Cardiology 06/15/2021  ? ?Allergies:  ?Patient has no known allergies. ? ?Current Home Medications:  ? ?No current facility-administered medications for this encounter.  ? ? ADVAIR DISKUS 250-50 MCG/ACT AEPB  ? aspirin 81 MG chewable tablet  ? atorvastatin (LIPITOR) 80 MG tablet  ? lisinopril (ZESTRIL) 40 MG tablet  ? megestrol (MEGACE) 20 MG tablet  ? thiamine (VITAMIN B-1) 100 MG tablet  ? acetaminophen (TYLENOL) 500 MG tablet  ? ?History:  ? ?Past Medical History:  ?Diagnosis Date  ? Alcohol abuse   ? Aortic stenosis 06/04/2017  ? a.) TTE 06/04/2017: mild; MPG 20 mmHg. b.) TTE 08/14/209: mild-mod: MPG 22 mmHg. c.) TTE 03/26/2018: mod: MPG 30 mmHg. d.) TTE 06/15/2021: severe; MPG 44.5 mmHG  ? Bipolar disorder ([place])   ? Cardiac murmur   ? COPD (chronic obstructive pulmonary disease) ([place])   ? Demand ischemia of myocardium ([place]) 01/2014  ? a.) positive troponins in the setting of a fall during intoxication; b.) Myoview 2/16: EF 65-70%, no evi of ischemia or infarct. No RWMA.; c.) MV 9/16: mild ischemia in the mid anterolateral, mid anterior, basal anterolateral and basal anterior segments, patient declined LHC  ? Dementia ([place])   ? Diastolic dysfunction   ? a.) TTE 9/16: EF of  50-55%, G2DD, severely dilated LA, and moderate AS; b.) TTE 3/19: EF of 50-55%, G1DD, mildly dilated LA, mildly calcified MV leaflets, mild AS, mild TR, trace PR, RVSP 46 mmHg, trivial pericardial effusion. c.) TTE 03/26/2018: EF 60-65%; mild LA dilation, mod AS (MPG 30 mmHG); G2DD. d.) TTE 06/08/2021: EF 60-65%; severe AS (MPG 44.5 mmHg); G1DD.  ? Dyslipidemia   ? Essential hypertension   ?  Gastroesophageal reflux disease   ? HLD (hyperlipidemia)   ? HOH (hard of hearing)   ? NSTEMI (non-ST elevated myocardial infarction) ([place]) 11/17/2014  ? a.) TTE with EF 50-55%; G3DD; severe dilated LA; mod AS; stress test

## 2021-06-21 NOTE — Progress Notes (Incomplete Revision)
?Perioperative Services ? ?Pre-Admission/Anesthesia Testing Clinical Review ? ?Date: 06/23/21 ? ?Patient Demographics:  ?Name: Adam Good ?DOB:   Aug 12, 1945 ?MRN:   [id number] ? ?Planned Surgical Procedure(s):  ? ? Case: [id number] Date/Time: 06/27/21 0715  ? Procedures:  ?    PROSTATE BIOPSY ?    TRANSRECTAL ULTRASOUND  ? Anesthesia type: Monitor Anesthesia Care  ? Pre-op diagnosis: Elevated Prostate Specific Antigen  ? Location: [facility] OR ROOM 10 / [facility] ORS FOR ANESTHESIA GROUP  ? Surgeons: Abbie Sons, MD  ? ?NOTE: Available PAT nursing documentation and vital signs have been reviewed. Clinical nursing staff has updated patient's PMH/PSHx, current medication list, and drug allergies/intolerances to ensure comprehensive history available to assist in medical decision making as it pertains to the aforementioned surgical procedure and anticipated anesthetic course. Extensive review of available clinical information performed. [place] PMH and PSHx updated with any diagnoses/procedures that  may have been inadvertently omitted during his intake with the pre-admission testing department's nursing staff. ? ?Clinical Discussion:  ?Adam Good is a 76 y.o. male who is submitted for pre-surgical anesthesia review and clearance prior to him undergoing the above procedure. Patient is a Former Smoker (28.5 pack years; quit 02/2021). Pertinent PMH includes: CAD, NSTEMI, aortic stenosis, diastolic dysfunction, cardiac murmur, HTN, HLD, GERD (no daily Tx), OSA (does not use nocturnal PAP therapy), COPD, ETOH abuse, dementia, bipolar disorder, HOH.  ? ?Patient is followed by cardiology Rockey Situ, MD). He was last seen in the cardiology clinic on 06/15/2021; notes reviewed.  At the time of this clinic visit, patient doing well overall from a cardiovascular perspective.  He denied any episodes of chest pain, short of breath, PND, orthopnea, palpitations, significant peripheral edema, vertiginous symptoms, or  presyncope/syncope.  Exam revealed a grade II/VI systolic ejection murmur appreciated at the RSB.  Patient with a past medical history significant for cardiovascular diagnoses. ? ?Patient suffered an NSTEMI on 11/17/2014.  TTE at that time revealed a normal left ventricular ejection fraction of 50-55% with restrictive filling pattern (G3DD), a severely dilated left atrium, and moderate aortic valve stenosis.  Patient underwent stress testing that demonstrated mild ischemia in the mid anterolateral, mid anterior, basal and lateral, and basal anterior segments.  Cardiac catheterization was recommended, however patient declined opting for medical management. ? ?Last myocardial perfusion imaging study performed on 10/30/2017 revealed a normal left ventricular systolic function with an EF 58%.  Resting ECG revealed LVH and repolarization abnormality.  There was a T wave abnormality noted in the precordial and inferior leads.  Study did not demonstrate any evidence of significant ischemia, and therefore determined to be low risk. ? ?Last TTE was performed on 06/15/2021 revealing normal left ventricular systolic function with an EF of 60 to 65%. Diastolic Doppler parameters consistent with abnormal relaxation (G1DD).  PASP elevated; estimated RVSP 52.3.  There was severe calcification of the aortic valve causing severe stenosis; mean gradient 44.5 mmHg; AVA (VTI) = 0.66 cm?.  Degree of stenosis and aortic valve progressive from previous TTE performed back in 03/2018, at which time transvalvular gradient was 30 mmHg.  Patient referred to structural cardiology clinic for further evaluation and consideration of management options, which will ultimately be a TAVR procedure.  ? ?Blood pressure well controlled at 100/68 on currently prescribed ACEi monotherapy.  Patient is on a statin for his HLD.  It is pertinent to note that patient is taking and appetite stimulant (megestrol) for weight loss, which carried the increased risk  for thrombotic events.  Patient is not diabetic. He has an OSAH diagnosis, however does not currently utilize nocturnal PAP therapy.  Functional capacity, as defined by DASI, is documented as being >/= 4 METS.  No changes were made to his medication regimen.  Given evidence of progression of patient's aortic valve stenosis to severe, patient has been referred to structural heart group; appointment pending.  Patient to follow-up with primary outpatient cardiologist at defined intervals for ongoing management of his cardiovascular diagnoses. ? ?[name] is scheduled for a TRANSRECTAL ULTRASOUND AND PROSTATE BIOPSY on 06/27/2021 with Dr. John Giovanni, MD.  Given patient's past medical history significant for cardiovascular diagnoses, presurgical cardiac clearance was sought by the PAT team.  Per cardiology, "recent echocardiogram from performed showing progression of aortic valve stenosis to now severe.  However, given low risk nature of prostate procedure, patient remains at an ACCEPTABLE risk to proceed with planned procedure without further cardiovascular testing". In review of his medication reconciliation, it is noted the patient is on daily antiplatelet therapy. He has been instructed on recommendations from his cardiologist for holding his daily low-dose ASA for 7 days prior to his procedure with plans to restart as soon as postoperative bleeding risk felt to be minimized by his primary attending surgeon. The patient is aware that his last dose of ASA should be on 06/19/2021. ? ?Patient denies previous perioperative complications with anesthesia in the past. In review of the EMR, there are no records available for review regarding patient's past surgical/anesthetic courses within the [facility] system. ? ? ?  06/20/2021  ?  9:53 AM 06/15/2021  ?  2:36 PM 06/15/2021  ?  2:20 PM  ?Vitals with BMI  ?Height '5\' 9"'$  '5\' 9"'$  '5\' 9"'$   ?Weight 120 lbs 125 lbs 6 oz 125 lbs 6 oz  ?BMI 17.71 18.51 18.51  ?Systolic  [phone]   ?Diastolic  68 70  ?Pulse  87 87  ? ? ?Providers/Specialists:  ? ?NOTE: Primary physician provider listed below. Patient may have been seen by APP or partner within same practice.  ? ?PROVIDER ROLE / SPECIALTY LAST OV  ?Abbie Sons, MD Urology (Surgeon) 06/11/2021  ?[facility] [place] Primary Care Provider ???  ?Ida Rogue, MD Cardiology 06/15/2021  ? ?Allergies:  ?Patient has no known allergies. ? ?Current Home Medications:  ? ?No current facility-administered medications for this encounter.  ? ? ADVAIR DISKUS 250-50 MCG/ACT AEPB  ? aspirin 81 MG chewable tablet  ? atorvastatin (LIPITOR) 80 MG tablet  ? lisinopril (ZESTRIL) 40 MG tablet  ? megestrol (MEGACE) 20 MG tablet  ? thiamine (VITAMIN B-1) 100 MG tablet  ? acetaminophen (TYLENOL) 500 MG tablet  ? ?History:  ? ?Past Medical History:  ?Diagnosis Date  ? Alcohol abuse   ? Aortic stenosis 06/04/2017  ? a.) TTE 06/04/2017: mild; MPG 20 mmHg. b.) TTE 08/14/209: mild-mod: MPG 22 mmHg. c.) TTE 03/26/2018: mod: MPG 30 mmHg. d.) TTE 06/15/2021: severe; MPG 44.5 mmHG  ? Bipolar disorder ([place])   ? Cardiac murmur   ? COPD (chronic obstructive pulmonary disease) ([place])   ? Demand ischemia of myocardium ([place]) 01/2014  ? a.) positive troponins in the setting of a fall during intoxication; b.) Myoview 2/16: EF 65-70%, no evi of ischemia or infarct. No RWMA.; c.) MV 9/16: mild ischemia in the mid anterolateral, mid anterior, basal anterolateral and basal anterior segments, patient declined LHC  ? Dementia ([place])   ? Diastolic dysfunction   ? a.) TTE 9/16: EF of  50-55%, G2DD, severely dilated LA, and moderate AS; b.) TTE 3/19: EF of 50-55%, G1DD, mildly dilated LA, mildly calcified MV leaflets, mild AS, mild TR, trace PR, RVSP 46 mmHg, trivial pericardial effusion. c.) TTE 03/26/2018: EF 60-65%; mild LA dilation, mod AS (MPG 30 mmHG); G2DD. d.) TTE 06/08/2021: EF 60-65%; severe AS (MPG 44.5 mmHg); G1DD.  ? Dyslipidemia   ? Essential hypertension   ?  Gastroesophageal reflux disease   ? HLD (hyperlipidemia)   ? HOH (hard of hearing)   ? NSTEMI (non-ST elevated myocardial infarction) ([place]) 11/17/2014  ? a.) TTE with EF 50-55%; G3DD; severe dilated LA; mod AS; stress test

## 2021-06-22 ENCOUNTER — Encounter
Admission: RE | Admit: 2021-06-22 | Discharge: 2021-06-22 | Disposition: A | Discharge status: Home / Self Care | Payer: Medicare Other | Source: Ambulatory Visit | Attending: Urology | Admitting: Urology

## 2021-06-22 DIAGNOSIS — Z01812 Encounter for preprocedural laboratory examination: Secondary | ICD-10-CM | POA: Diagnosis present

## 2021-06-22 DIAGNOSIS — I10 Essential (primary) hypertension: Secondary | ICD-10-CM | POA: Insufficient documentation

## 2021-06-22 LAB — CBC
HCT: 39.2 % (ref 39.0–52.0)
Hemoglobin: 12.8 g/dL — ABNORMAL LOW (ref 13.0–17.0)
MCH: 30.6 pg (ref 26.0–34.0)
MCHC: 32.7 g/dL (ref 30.0–36.0)
MCV: 93.8 fL (ref 80.0–100.0)
Platelets: 219 10*3/uL (ref 150–400)
RBC: 4.18 MIL/uL — ABNORMAL LOW (ref 4.22–5.81)
RDW: 13.2 % (ref 11.5–15.5)
WBC: 8.2 10*3/uL (ref 4.0–10.5)
nRBC: 0 % (ref 0.0–0.2)

## 2021-06-22 LAB — BASIC METABOLIC PANEL
Anion gap: 8 (ref 5–15)
BUN: 26 mg/dL — ABNORMAL HIGH (ref 8–23)
CO2: 24 mmol/L (ref 22–32)
Calcium: 8.9 mg/dL (ref 8.9–10.3)
Chloride: 109 mmol/L (ref 98–111)
Creatinine, Ser: 1.39 mg/dL — ABNORMAL HIGH (ref 0.61–1.24)
GFR, Estimated: 53 mL/min — ABNORMAL LOW (ref >60)
Glucose, Bld: 147 mg/dL — ABNORMAL HIGH (ref 70–99)
Potassium: 3.9 mmol/L (ref 3.5–5.1)
Sodium: 141 mmol/L (ref 135–145)

## 2021-06-23 ENCOUNTER — Encounter: Payer: Self-pay | Admitting: Urology

## 2021-06-23 NOTE — Pre-Procedure Instructions (Signed)
Called and spoke with gaurdian Toula Moos per NP Honor Loh about pt being deemed high risk for procedure with anesthesia, even thought cardiology has deemed him an acceptable risk.Faxed anesthesia note regarding this to [name] so she can read over note and make decision if she will proceed with pt having surgery at [facility]. She was given my direct number so she can contact me Monday ?

## 2021-06-26 MED ORDER — SODIUM CHLORIDE 0.9 % IV SOLN: INTRAVENOUS | Status: DC

## 2021-06-26 MED ORDER — ORAL CARE MOUTH RINSE: 15.0000 mL | Freq: Once | OROMUCOSAL | Status: AC

## 2021-06-26 MED ORDER — FAMOTIDINE 20 MG PO TABS
20.0000 mg | ORAL_TABLET | Freq: Once | ORAL | Status: AC
Start: 2021-06-26 — End: 2021-06-27

## 2021-06-26 MED ORDER — CHLORHEXIDINE GLUCONATE 0.12 % MT SOLN: 15.0000 mL | Freq: Once | OROMUCOSAL | Status: AC

## 2021-06-26 MED ORDER — SODIUM CHLORIDE 0.9 % IV SOLN
1.0000 g | INTRAVENOUS | Status: AC
  Administered 2021-06-27: 1 g via INTRAVENOUS
  Filled 2021-06-26: qty 1

## 2021-06-26 NOTE — Pre-Procedure Instructions (Signed)
Cardiac clearance on chart from Dr Cari Caraway risk ?

## 2021-06-26 NOTE — Pre-Procedure Instructions (Addendum)
06/20/21 Phone call Tuesday TC to Toula Moos at [facility] to [phone] request consent sign by Legal Guardian and also requested DSS paperwork for legal guardianship. Received consent for procedure but did not receive paperwork the legal guardian.  ? ?06/20/21 Same day spoke with [facility] caregiver at [phone] and request that when he brings in patient for labs to bring in paper work for [place] legal guardian.Madelynn Done forgot to bring in [place] legal guardian. ? ?06/26/21 TC to Toula Moos at [place] to [phone] to request Legal guardian paperwork, she stated that she is about to fax it along with the Hippa and AOB from registration in the next 10 mins. Advise we need to have papers signed before procedure can happen. ?

## 2021-06-26 NOTE — Pre-Procedure Instructions (Signed)
Finally received HIPPA, AOB and legal gaurdianship paperwork..faxed this up to sds ?

## 2021-06-26 NOTE — Pre-Procedure Instructions (Addendum)
Called Toula Moos (Legal Gaurdian) to see if she had received my fax Friday regarding anesthesia note. [name] stated she had not. I just refaxed this to her and she will call me back and let me know what they decide. ?I also told [name] that registration has repeatedly faxed over the AOB and HIPPA forms for her to sign. She said she had received them. I told her those needed to be signed and faxed back to [place]. She verbalized she would ?

## 2021-06-26 NOTE — Pre-Procedure Instructions (Addendum)
I still have not heard anything back from [place] Best boy). I am assuming she received my fax and is willing to proceed with surgery ?

## 2021-06-27 ENCOUNTER — Ambulatory Visit: Payer: Medicare Other | Admitting: Urgent Care

## 2021-06-27 ENCOUNTER — Encounter
Admission: RE | Disposition: A | Discharge status: Home Health | Payer: Self-pay | Source: Home / Self Care | Attending: Urology

## 2021-06-27 ENCOUNTER — Other Ambulatory Visit: Payer: Self-pay

## 2021-06-27 ENCOUNTER — Ambulatory Visit
Admission: RE | Admit: 2021-06-27 | Discharge: 2021-06-27 | Disposition: A | Discharge status: Home / Self Care | Payer: Medicare Other | Source: Ambulatory Visit | Attending: Urology | Admitting: Urology

## 2021-06-27 ENCOUNTER — Ambulatory Visit
Admission: RE | Admit: 2021-06-27 | Discharge: 2021-06-27 | Disposition: A | Discharge status: Home Health | Payer: Medicare Other | Attending: Urology | Admitting: Urology

## 2021-06-27 ENCOUNTER — Encounter: Payer: Self-pay | Admitting: Urology

## 2021-06-27 DIAGNOSIS — I10 Essential (primary) hypertension: Secondary | ICD-10-CM | POA: Diagnosis not present

## 2021-06-27 DIAGNOSIS — Z79899 Other long term (current) drug therapy: Secondary | ICD-10-CM | POA: Diagnosis not present

## 2021-06-27 DIAGNOSIS — E785 Hyperlipidemia, unspecified: Secondary | ICD-10-CM | POA: Insufficient documentation

## 2021-06-27 DIAGNOSIS — I2510 Atherosclerotic heart disease of native coronary artery without angina pectoris: Secondary | ICD-10-CM | POA: Diagnosis not present

## 2021-06-27 DIAGNOSIS — I252 Old myocardial infarction: Secondary | ICD-10-CM | POA: Insufficient documentation

## 2021-06-27 DIAGNOSIS — J449 Chronic obstructive pulmonary disease, unspecified: Secondary | ICD-10-CM | POA: Diagnosis not present

## 2021-06-27 DIAGNOSIS — R972 Elevated prostate specific antigen [PSA]: Secondary | ICD-10-CM | POA: Diagnosis not present

## 2021-06-27 DIAGNOSIS — Z8673 Personal history of transient ischemic attack (TIA), and cerebral infarction without residual deficits: Secondary | ICD-10-CM | POA: Insufficient documentation

## 2021-06-27 DIAGNOSIS — H9190 Unspecified hearing loss, unspecified ear: Secondary | ICD-10-CM | POA: Diagnosis not present

## 2021-06-27 DIAGNOSIS — C61 Malignant neoplasm of prostate: Secondary | ICD-10-CM | POA: Diagnosis not present

## 2021-06-27 DIAGNOSIS — G4733 Obstructive sleep apnea (adult) (pediatric): Secondary | ICD-10-CM | POA: Diagnosis not present

## 2021-06-27 DIAGNOSIS — I350 Nonrheumatic aortic (valve) stenosis: Secondary | ICD-10-CM | POA: Insufficient documentation

## 2021-06-27 HISTORY — DX: Cardiac murmur, unspecified: R01.1

## 2021-06-27 HISTORY — DX: Unspecified hearing loss, unspecified ear: H91.90

## 2021-06-27 HISTORY — PX: TRANSRECTAL ULTRASOUND: SHX5146

## 2021-06-27 HISTORY — PX: PROSTATE BIOPSY: SHX241

## 2021-06-27 SURGERY — BIOPSY, PROSTATE
Anesthesia: Monitor Anesthesia Care

## 2021-06-27 MED ORDER — PROPOFOL 500 MG/50ML IV EMUL
INTRAVENOUS | Status: AC
  Filled 2021-06-27: qty 50

## 2021-06-27 MED ORDER — PROPOFOL 10 MG/ML IV BOLUS
INTRAVENOUS | Status: DC | PRN
  Administered 2021-06-27: 20 mg via INTRAVENOUS
  Administered 2021-06-27: 40 mg via INTRAVENOUS
  Administered 2021-06-27: 20 mg via INTRAVENOUS

## 2021-06-27 MED ORDER — PHENYLEPHRINE 40 MCG/ML (10ML) SYRINGE FOR IV PUSH (FOR BLOOD PRESSURE SUPPORT)
PREFILLED_SYRINGE | INTRAVENOUS | Status: DC | PRN
  Administered 2021-06-27: 40 ug via INTRAVENOUS

## 2021-06-27 MED ORDER — MIDAZOLAM HCL 2 MG/2ML IJ SOLN
INTRAMUSCULAR | Status: DC | PRN
  Administered 2021-06-27: 1 mg via INTRAVENOUS

## 2021-06-27 MED ORDER — 0.9 % SODIUM CHLORIDE (POUR BTL) OPTIME
TOPICAL | Status: DC | PRN
  Administered 2021-06-27: 100 mL

## 2021-06-27 MED ORDER — PHENYLEPHRINE 40 MCG/ML (10ML) SYRINGE FOR IV PUSH (FOR BLOOD PRESSURE SUPPORT)
PREFILLED_SYRINGE | INTRAVENOUS | Status: AC
  Filled 2021-06-27: qty 10

## 2021-06-27 MED ORDER — EPHEDRINE 5 MG/ML INJ
INTRAVENOUS | Status: AC
  Filled 2021-06-27: qty 5

## 2021-06-27 MED ORDER — FAMOTIDINE 20 MG PO TABS
ORAL_TABLET | ORAL | Status: AC
Start: 2021-06-27 — End: 2021-06-27
  Administered 2021-06-27: 20 mg via ORAL
  Filled 2021-06-27: qty 1

## 2021-06-27 MED ORDER — CHLORHEXIDINE GLUCONATE 0.12 % MT SOLN
OROMUCOSAL | Status: AC
  Administered 2021-06-27: 15 mL via OROMUCOSAL
  Filled 2021-06-27: qty 15

## 2021-06-27 MED ORDER — ONDANSETRON HCL 4 MG/2ML IJ SOLN: 4.0000 mg | Freq: Once | INTRAMUSCULAR | Status: DC | PRN

## 2021-06-27 MED ORDER — OXYCODONE HCL 5 MG/5ML PO SOLN: 5.0000 mg | Freq: Once | ORAL | Status: DC | PRN

## 2021-06-27 MED ORDER — FENTANYL CITRATE (PF) 100 MCG/2ML IJ SOLN: 25.0000 ug | INTRAMUSCULAR | Status: DC | PRN

## 2021-06-27 MED ORDER — OXYCODONE HCL 5 MG PO TABS: 5.0000 mg | ORAL_TABLET | Freq: Once | ORAL | Status: DC | PRN

## 2021-06-27 MED ORDER — MIDAZOLAM HCL 2 MG/2ML IJ SOLN
INTRAMUSCULAR | Status: AC
  Filled 2021-06-27: qty 2

## 2021-06-27 SURGICAL SUPPLY — 8 items
COVER MAYO STAND REUSABLE (DRAPES) ×2 IMPLANT
GAUZE SPONGE 4X4 12PLY STRL (GAUZE/BANDAGES/DRESSINGS) ×2 IMPLANT
GLOVE SURG UNDER POLY LF SZ7.5 (GLOVE) ×2 IMPLANT
INST BIOPSY MAXCORE 18GX25 (NEEDLE) ×2 IMPLANT
KIT TURNOVER CYSTO (KITS) ×2 IMPLANT
MANIFOLD NEPTUNE II (INSTRUMENTS) ×2 IMPLANT
SURGILUBE 2OZ TUBE FLIPTOP (MISCELLANEOUS) ×2 IMPLANT
WATER STERILE IRR 500ML POUR (IV SOLUTION) ×2 IMPLANT

## 2021-06-27 NOTE — Discharge Instructions (Signed)
Rectal bleeding is common and should resolve within 24 hours ?Intermittent blood in the urine is normal and can persist for 2-3 weeks ?Contact the office or proceed to the ED for excessive rectal or urinary bleeding (heavy bleeding with clots); fever greater than 101 degrees or urinary difficulty ?

## 2021-06-27 NOTE — Anesthesia Preprocedure Evaluation (Addendum)
Anesthesia Evaluation  ?Patient identified by MRN, date of birth, ID band ?Patient awake and Patient confused ? ?General Assessment Comment: ? ?Patient has legal guardian ?Patient fell and hit his head this morning. No apparent deficits. ? ?Reviewed: ?Allergy & Precautions, NPO status , Patient's Chart, lab work & pertinent test results ? ?History of Anesthesia Complications ?Negative for: history of anesthetic complications ? ?Airway ?Mallampati: III ? ?TM Distance: >3 FB ?Neck ROM: Full ? ? ? Dental ? ?(+) Edentulous Upper, Edentulous Lower ?  ?Pulmonary ?sleep apnea , COPD, Patient abstained from smoking.Not current smoker, former smoker,  ?  ?Pulmonary exam normal ?breath sounds clear to auscultation ? ? ? ? ? ? Cardiovascular ?Exercise Tolerance: Poor ?METShypertension, Pt. on medications ?+ Past MI  ?(-) CAD (-) dysrhythmias + Valvular Problems/Murmurs AS  ?Rhythm:Regular Rate:Normal ?+ Systolic murmurs ?? Patient suffered an NSTEMI on 11/17/2014.  TTE at that time revealed a normal left ventricular ejection fraction of 50-55% with restrictive filling pattern (G3DD), a severely dilated left atrium, and moderate aortic valve stenosis.  Patient underwent stress testing that demonstrated mild ischemia in the mid anterolateral, mid anterior, basal and lateral, and basal anterior segments.  Cardiac catheterization was recommended, however patient declined opting for medical management. ?? ?? Last myocardial perfusion imaging study performed on 10/30/2017 revealed a normal left ventricular systolic function with an EF 58%.  Resting ECG revealed LVH and repolarization abnormality.  There was a T wave abnormality noted in the precordial and inferior leads.  Study did not demonstrate any evidence of significant ischemia, and therefore determined to be low risk. ?? ?? Last TTE was performed on 06/15/2021 revealing normal left ventricular systolic function with an EF of 60 to 65%.  Diastolic Doppler parameters consistent with abnormal relaxation (G1DD).  PASP elevated; estimated RVSP 52.3.  There was severe calcification of the aortic valve causing severe stenosis; mean gradient 44.5 mmHg; AVA (VTI) = 0.66 cm?.  Degree of stenosis and aortic valve progressive from previous TTE performed back in 03/2018, at which time transvalvular gradient was 30 mmHg.  Patient referred to structural cardiology clinic for further evaluation and consideration of management options, which will ultimately be a TAVR procedure.  ? ?Per cardiology, "recent echocardiogram from performed showing progression of aortic valve stenosis to now severe.  However, given low risk nature of prostate procedure, patient remains at an ACCEPTABLE risk to proceed with planned procedure without further cardiovascular testing".  ?  ?Neuro/Psych ?PSYCHIATRIC DISORDERS Bipolar Disorder CVA   ? GI/Hepatic ?GERD  Controlled,(+)  ?  ? (-) substance abuse ? ,   ?Endo/Other  ?neg diabetes ? Renal/GU ?negative Renal ROS  ? ?  ?Musculoskeletal ? ? Abdominal ?  ?Peds ? Hematology ?  ?Anesthesia Other Findings ?Past Medical History: ?No date: Alcohol abuse ?06/04/2017: Aortic stenosis ?    Comment:  a.) TTE 06/04/2017: mild; MPG 20 mmHg. b.) TTE  ?             08/14/209: mild-mod: MPG 22 mmHg. c.) TTE 03/26/2018:  ?             mod: MPG 30 mmHg. d.) TTE 06/15/2021: severe; MPG 44.5  ?             mmHG ?No date: Bipolar disorder ([place]) ?No date: Cardiac murmur ?No date: COPD (chronic obstructive pulmonary disease) ([place]) ?01/2014: Demand ischemia of myocardium ([place]) ?    Comment:  a.) positive troponins in the setting of a fall during  ?  intoxication; b.) Myoview 2/16: EF 65-70%, no evi of  ?             ischemia or infarct. No RWMA.; c.) MV 9/16: mild ischemia ?             in the mid anterolateral, mid anterior, basal  ?             anterolateral and basal anterior segments, patient  ?             declined LHC ?No date: Dementia  [facility]) ?No date: Diastolic dysfunction ?    Comment:  a.) TTE 9/16: EF of 50-55%, G2DD, severely dilated LA,  ?             and moderate AS; b.) TTE 3/19: EF of 50-55%, G1DD, mildly ?             dilated LA, mildly calcified MV leaflets, mild AS, mild  ?             TR, trace PR, RVSP 46 mmHg, trivial pericardial effusion. ?             c.) TTE 03/26/2018: EF 60-65%; mild LA dilation, mod AS  ?             (MPG 30 mmHG); G2DD. d.) TTE 06/08/2021: EF 60-65%;  ?             severe AS (MPG 44.5 mmHg); G1DD. ?No date: Dyslipidemia ?No date: Essential hypertension ?No date: Gastroesophageal reflux disease ?No date: HLD (hyperlipidemia) ?No date: HOH (hard of hearing) ?11/17/2014: NSTEMI (non-ST elevated myocardial infarction) ([place]) ?    Comment:  a.) TTE with EF 50-55%; G3DD; severe dilated LA; mod AS; ?             stress testing revealed mild ischemia in the mid  ?             anterolateral, mid anterior, basal and lateral, and basal ?             anterior segments; LHC recommended, however patient  ?             declined opting for medical mgmt. ?No date: Sleep apnea ?No date: Stroke [facility]) ? Reproductive/Obstetrics ? ?  ? ? ? ? ? ? ? ? ? ? ? ? ? ?  ?  ? ? ? ? ? ? ?Anesthesia Physical ?Anesthesia Plan ? ?ASA: 4 ? ?Anesthesia Plan: MAC  ? ?Post-op Pain Management: Minimal or no pain anticipated  ? ?Induction: Intravenous ? ?PONV Risk Score and Plan: 2 and Ondansetron, Midazolam and TIVA ? ?Airway Management Planned: Natural Airway ? ?Additional Equipment: None ? ?Intra-op Plan:  ? ?Post-operative Plan:  ? ?Informed Consent: I have reviewed the patients History and Physical, chart, labs and discussed the procedure including the risks, benefits and alternatives for the proposed anesthesia with the patient or authorized representative who has indicated his/her understanding and acceptance.  ? ? ? ?Dental advisory given and Consent reviewed with POA ? ?Plan Discussed with: CRNA and Surgeon ? ?Anesthesia Plan Comments:  (Discussed risks of anesthesia with DSS representative via phone Denyce Robert, including possibility of difficulty with spontaneous ventilation under anesthesia necessitating airway intervention, PONV, and rare risks such as cardiac or respiratory or neurological events, and allergic reactions. She was counseled on being higher risk for anesthesia due to comorbidities: severe AS. She was told about increased risk of cardiac and respiratory events, including death. I stressed his  higher risk despite cardiology's determination of adequate optimization and adequate risk, even though they referred him for a TAVR. ? Discussed the role of CRNA in patient's perioperative care. She understands. She provided consent.)  ? ? ? ? ? ? ?Anesthesia Quick Evaluation ? ?

## 2021-06-27 NOTE — Anesthesia Postprocedure Evaluation (Signed)
Anesthesia Post Note ? ?Patient: ELLIJAH LEFFEL ? ?Procedure(s) Performed: PROSTATE BIOPSY ?TRANSRECTAL ULTRASOUND ? ?Patient location during evaluation: PACU ?Anesthesia Type: MAC ?Level of consciousness: awake and alert ?Pain management: pain level controlled ?Vital Signs Assessment: post-procedure vital signs reviewed and stable ?Respiratory status: spontaneous breathing, nonlabored ventilation, respiratory function stable and patient connected to nasal cannula oxygen ?Cardiovascular status: blood pressure returned to baseline and stable ?Postop Assessment: no apparent nausea or vomiting ?Anesthetic complications: no ? ? ?No notable events documented. ? ? ?Last Vitals:  ?Vitals:  ? 06/27/21 0846 06/27/21 0859  ?BP: 135/86 (!) 152/84  ?Pulse: 72 77  ?Resp: 14 18  ?Temp: 37.3 ?C 36.8 ?C  ?SpO2: 100% 100%  ?  ?Last Pain:  ?Vitals:  ? 06/27/21 0859  ?TempSrc: Temporal  ?PainSc: 0-No pain  ? ? ?  ?  ?  ?  ?  ?  ? ?Arita Miss ? ? ? ? ?

## 2021-06-27 NOTE — Interval H&P Note (Signed)
History and Physical Interval Note: ?CV:RRR ?Lungs: clear ? ?06/27/2021 ?7:18 AM ? ?Adam Good  has presented today for surgery, with the diagnosis of Elevated Prostate Specific Antigen.  The various methods of treatment have been discussed with the patient and family. After consideration of risks, benefits and other options for treatment, the patient has consented to  Procedure(s): ?PROSTATE BIOPSY (N/A) ?TRANSRECTAL ULTRASOUND (N/A) as a surgical intervention.  The patient's history has been reviewed, patient examined, no change in status, stable for surgery.  I have reviewed the patient's chart and labs.  Questions were answered to the patient's satisfaction.   ? ? ?Adam Good ? ? ?

## 2021-06-27 NOTE — Transfer of Care (Signed)
Immediate Anesthesia Transfer of Care Note ? ?Patient: Adam Good ? ?Procedure(s) Performed: PROSTATE BIOPSY ?TRANSRECTAL ULTRASOUND ? ?Patient Location: PACU ? ?Anesthesia Type:MAC ? ?Level of Consciousness: drowsy ? ?Airway & Oxygen Therapy: Patient Spontanous Breathing and Patient connected to face mask oxygen ? ?Post-op Assessment: Report given to RN, Post -op Vital signs reviewed and stable and Patient moving all extremities X 4 ? ?Post vital signs: Reviewed and stable ? ?Last Vitals:  ?Vitals Value Taken Time  ?BP 147/113 06/27/21 0806  ?Temp    ?Pulse 77 06/27/21 0806  ?Resp 18 06/27/21 0806  ?SpO2 100 % 06/27/21 0806  ?Vitals shown include unvalidated device data. ? ?Last Pain:  ?Vitals:  ? 06/27/21 0628  ?TempSrc: Temporal  ?PainSc: 0-No pain  ?   ? ?  ? ?Complications: No notable events documented. ?

## 2021-06-27 NOTE — Op Note (Signed)
Preoperative diagnosis:  ?Elevated PSA ? ?Postoperative diagnosis:  ?Same ? ?Procedure: ?Transrectal ultrasound prostate ?Transrectal prostate biopsies ? ?Surgeon: Abbie Sons, MD ? ?Anesthesia: MAC ? ?Complications: None ? ?Intraoperative findings:  ?Firm right prostate DRE ?Prostate volume calculated 11 cc ? ?EBL: Minimal ? ?Specimens: None ? ?Indication: Adam Good is a 76 y.o. male with significant comorbidities and a PSA of 194.  He was unable to tolerate DRE in office and is scheduled for prostate biopsy under sedation.  He has been seen by cardiology preoperatively and has been cleared.  After reviewing the management options for treatment, he elected to proceed with the above surgical procedure(s). We have discussed the potential benefits and risks of the procedure, side effects of the proposed treatment, the likelihood of the patient achieving the goals of the procedure, and any potential problems that might occur during the procedure or recuperation. Informed consent has been obtained. ? ?Description of procedure: ? ?The patient was taken to the operating room.  He was not transferred to the OR table and remained on the stretcher.  He was placed in the left lateral decubitus position and sedation was obtained by anesthesia.  Preoperative antibiotics were administered. A preoperative time-out was performed.  ? ?DRE was performed and there was abnormal firmness of the right prostate.  A transrectal ultrasound probe was then lubricated and inserted.  The right peripheral zone was hypoechoic.  Height, width and longitudinal measurements were obtained and prostate volume calculated at 11 cc. ? ?Due to PSA elevation and medical comorbidities only sextant biopsies were obtained under ultrasound guidance.  And additional right apical digital biopsy was taken.  No bleeding was noted. ? ?He was transported to the PACU in stable condition. ? ?Plan: ?Legal guardian will be notified with the biopsy  results ? ? ?Abbie Sons, M.D. ? ?

## 2021-06-28 LAB — SURGICAL PATHOLOGY

## 2021-06-29 ENCOUNTER — Ambulatory Visit: Payer: Medicare Other | Admitting: Internal Medicine

## 2021-06-29 NOTE — Progress Notes (Deleted)
? ? ?Patient ID: ?Kirtland Bouchard ?MRN: [id number] ?DOB/AGE: [birth year] 76 y.o. ? ?Primary Care Physician:Matrone, Mitzi Hansen, NP ?Primary Cardiologist: Ida Rogue, MD, PhD ? ? ?FOCUSED CARDIOVASCULAR PROBLEM LIST:   ?1.  Severe aortic stenosis with an aortic valve area of 0.66 cm?, mean gradient of 44 mmHg and peak velocity of 4.7 m/s with normal ejection fraction; EKG without conduction abnormalities ?2.  Prostate disease with recent biopsy ?3.  Hyperlipidemia ?4.  Prior alcohol and tobacco abuse ? ? ?HISTORY OF PRESENT ILLNESS: ?The patient is a 76 y.o. male with the indicated medical history here for recommendations regarding his severe aortic stenosis noted on echocardiogram done recently.  The patient was seen by Dr. Rockey Situ for preoperative assessment prior to prostate biopsy.  His echocardiogram demonstrated severe aortic stenosis however given the low risk associated with this procedure no further cardiac evaluation or treatment was required. ? ?Past Medical History:  ?Diagnosis Date  ? Alcohol abuse   ? Aortic stenosis 06/04/2017  ? a.) TTE 06/04/2017: mild; MPG 20 mmHg. b.) TTE 08/14/209: mild-mod: MPG 22 mmHg. c.) TTE 03/26/2018: mod: MPG 30 mmHg. d.) TTE 06/15/2021: severe; MPG 44.5 mmHG  ? Bipolar disorder ([place])   ? Cardiac murmur   ? COPD (chronic obstructive pulmonary disease) ([place])   ? Demand ischemia of myocardium ([place]) 01/2014  ? a.) positive troponins in the setting of a fall during intoxication; b.) Myoview 2/16: EF 65-70%, no evi of ischemia or infarct. No RWMA.; c.) MV 9/16: mild ischemia in the mid anterolateral, mid anterior, basal anterolateral and basal anterior segments, patient declined LHC  ? Dementia ([place])   ? Diastolic dysfunction   ? a.) TTE 9/16: EF of 50-55%, G2DD, severely dilated LA, and moderate AS; b.) TTE 3/19: EF of 50-55%, G1DD, mildly dilated LA, mildly calcified MV leaflets, mild AS, mild TR, trace PR, RVSP 46 mmHg, trivial pericardial effusion. c.) TTE 03/26/2018: EF  60-65%; mild LA dilation, mod AS (MPG 30 mmHG); G2DD. d.) TTE 06/08/2021: EF 60-65%; severe AS (MPG 44.5 mmHg); G1DD.  ? Dyslipidemia   ? Essential hypertension   ? Gastroesophageal reflux disease   ? HLD (hyperlipidemia)   ? HOH (hard of hearing)   ? NSTEMI (non-ST elevated myocardial infarction) ([place]) 11/17/2014  ? a.) TTE with EF 50-55%; G3DD; severe dilated LA; mod AS; stress testing revealed mild ischemia in the mid anterolateral, mid anterior, basal and lateral, and basal anterior segments; LHC recommended, however patient declined opting for medical mgmt.  ? Sleep apnea   ? Stroke [facility])   ?  ?Past Surgical History:  ?Procedure Laterality Date  ? HERNIA REPAIR    ? KNEE SURGERY    ? PROSTATE BIOPSY N/A 06/27/2021  ? Procedure: PROSTATE BIOPSY;  Surgeon: Abbie Sons, MD;  Location: [facility] ORS;  Service: Urology;  Laterality: N/A;  ? TRANSRECTAL ULTRASOUND N/A 06/27/2021  ? Procedure: TRANSRECTAL ULTRASOUND;  Surgeon: Abbie Sons, MD;  Location: [facility] ORS;  Service: Urology;  Laterality: N/A;  ?  ?Family History  ?Problem Relation Age of Onset  ? Throat cancer Mother   ? Cancer Father   ? CAD Sister   ? Hypertension Sister   ? Diabetes Brother   ?  ?Social History  ? ?Socioeconomic History  ? Marital status: Single  ?  Spouse name: Not on file  ? Number of children: Not on file  ? Years of education: Not on file  ? Highest education level: Not on file  ?Occupational History  ?  Not on file  ?Tobacco Use  ? Smoking status: Former  ?  Packs/day: 0.50  ?  Years: 57.00  ?  Pack years: 28.50  ?  Types: Cigarettes  ?  Quit date: 02/2021  ?  Years since quitting: 0.3  ? Smokeless tobacco: Current  ?  Types: Snuff  ?Vaping Use  ? Vaping Use: Former  ?Substance and Sexual Activity  ? Alcohol use: Yes  ?  Alcohol/week: 0.0 standard drinks  ?  Comment: He has been a heavy alcohol drinker for > 30 yrs.  Stats that he quit 3 weeks ago.   ? Drug use: No  ? Sexual activity: Not on file  ?Other Topics Concern  ? Not on file   ?Social History Narrative  ? Not on file  ? ?Social Determinants of Health  ? ?Financial Resource Strain: Not on file  ?Food Insecurity: Not on file  ?Transportation Needs: Not on file  ?Physical Activity: Not on file  ?Stress: Not on file  ?Social Connections: Not on file  ?Intimate Partner Violence: Not on file  ?  ? ?Prior to Admission medications   ?Medication Sig Start Date End Date Taking? Authorizing Provider  ?acetaminophen (TYLENOL) 500 MG tablet Take 500 mg by mouth every 6 (six) hours as needed.    [provider]  ?ADVAIR DISKUS 250-50 MCG/ACT AEPB Inhale 1 puff into the lungs 2 (two) times daily. 05/30/21   [provider]  ?aspirin 81 MG chewable tablet Chew 81 mg by mouth daily.    [provider]  ?atorvastatin (LIPITOR) 80 MG tablet Take 80 mg by mouth at bedtime. 05/18/21   [provider]  ?lisinopril (ZESTRIL) 40 MG tablet Take 40 mg by mouth daily. 05/18/21   [provider]  ?megestrol (MEGACE) 20 MG tablet Take 20 mg by mouth daily. 06/01/21   [provider]  ?thiamine (VITAMIN B-1) 100 MG tablet Take 100 mg by mouth daily.    [provider]  ? ? ?No Known Allergies ? ?REVIEW OF SYSTEMS:  ?General: no fevers/chills/night sweats ?Eyes: no blurry vision, diplopia, or amaurosis ?ENT: no sore throat or hearing loss ?Resp: no cough, wheezing, or hemoptysis ?CV: no edema or palpitations ?GI: no abdominal pain, nausea, vomiting, diarrhea, or constipation ?GU: no dysuria, frequency, or hematuria ?Skin: no rash ?Neuro: no headache, numbness, tingling, or weakness of extremities ?Musculoskeletal: no joint pain or swelling ?Heme: no bleeding, DVT, or easy bruising ?Endo: no polydipsia or polyuria ? ?There were no vitals taken for this visit. ? ?PHYSICAL EXAM: ?GEN:  AO x 3 in no acute distress ?HEENT: normal ?Dentition: Normal*** ?Neck: JVP normal. +2***carotid upstrokes without bruits. No thyromegaly. ?Lungs: equal expansion, clear  bilaterally ?CV: Apex is discrete and nondisplaced, RRR without murmur or gallop*** ?Abd: soft, non-tender, non-distended; no bruit; positive bowel sounds ?Ext: no edema, ecchymoses, or cyanosis ?Vascular: 2+ femoral pulses, 2+ radial pulses       ?Skin: warm and dry without rash ?Neuro: CN II-XII grossly intact; motor and sensory grossly intact ? ? ? ?DATA AND STUDIES: ? ?EKG: April 2023 sinus rhythm with left ventricular hypertrophy ? ?2D ECHO: March 2023 ? 1. Left ventricular ejection fraction, by estimation, is 60 to 65%. The  ?left ventricle has normal function. The left ventricle has no regional  ?wall motion abnormalities. There is mild left ventricular hypertrophy.  ?Left ventricular diastolic parameters  ?are consistent with Grade I diastolic dysfunction (impaired relaxation).  ? 2. Right ventricular systolic function is  normal. The right ventricular  ?size is normal. There is moderately elevated pulmonary artery systolic  ?pressure. The estimated right ventricular systolic pressure is 14.4 mmHg.  ? 3. The mitral valve is normal in structure. No evidence of mitral valve  ?regurgitation. No evidence of mitral stenosis.  ? 4. The aortic valve is normal in structure. There is severe calcifcation  ?of the aortic valve. Aortic valve regurgitation is not visualized. Severe  ?aortic valve stenosis. Aortic valve area, by VTI measures 0.66 cm?Marland Kitchen Aortic  ?valve mean gradient measures  ?44.5 mmHg. Aortic valve Vmax measures 4.70 m/s.  ? 5. The inferior vena cava is normal in size with greater than 50%  ?respiratory variability, suggesting right atrial pressure of 3 mmHg. ? ?CARDIAC CATH: N/A ? ?STS RISK CALCULATOR: Pending ? ?NHYA CLASS: *** ? ? ? ?ASSESSMENT AND PLAN:  ? ?Nonrheumatic aortic valve stenosis ? ? ?I have personally reviewed the patients imaging data as summarized above. ? ?I have reviewed the natural history of aortic stenosis with the patient and family members who are present today. We have discussed  the limitations of medical therapy and the poor prognosis associated with symptomatic aortic stenosis. We have also reviewed potential treatment options, including palliative medical therapy, conventional surgical ao

## 2021-07-05 ENCOUNTER — Encounter: Payer: Self-pay | Admitting: Urology

## 2021-07-05 ENCOUNTER — Ambulatory Visit (INDEPENDENT_AMBULATORY_CARE_PROVIDER_SITE_OTHER): Payer: Medicare Other | Admitting: Urology

## 2021-07-05 VITALS — BP 80/54 | HR 99 | Ht 68.0 in | Wt 110.0 lb

## 2021-07-05 DIAGNOSIS — C61 Malignant neoplasm of prostate: Secondary | ICD-10-CM | POA: Diagnosis not present

## 2021-07-05 NOTE — Progress Notes (Signed)
? ?07/05/2021 ?1:33 PM  ? ?Adam Good ?1945-11-15 ?[id number] ? ?Referring provider: [facility], [facility] ?[address] ?[place],  [place] [address] ? ?Chief Complaint  ?Patient presents with  ? Other  ? ? ?HPI: ?76 y.o. male presents for prostate biopsy follow-up.  He presents today with group home caregiver ? 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 ? ?PMH: ?Past Medical History:  ?Diagnosis Date  ? Alcohol abuse   ? Aortic stenosis 06/04/2017  ? a.) TTE 06/04/2017: mild; MPG 20 mmHg. b.) TTE 08/14/209: mild-mod: MPG 22 mmHg. c.) TTE 03/26/2018: mod: MPG 30 mmHg. d.) TTE 06/15/2021: severe; MPG 44.5 mmHG  ? Bipolar disorder ([place])   ? Cardiac murmur   ? COPD (chronic obstructive pulmonary disease) ([place])   ? Demand ischemia of myocardium ([place]) 01/2014  ? a.) positive troponins in the setting of a fall during intoxication; b.) Myoview 2/16: EF 65-70%, no evi of ischemia or infarct. No RWMA.; c.) MV 9/16: mild ischemia in the mid anterolateral, mid anterior, basal anterolateral and basal anterior segments, patient declined LHC  ? Dementia ([place])   ? Diastolic dysfunction   ? a.) TTE 9/16: EF of 50-55%, G2DD, severely dilated LA, and moderate AS; b.) TTE 3/19: EF of 50-55%, G1DD, mildly dilated LA, mildly calcified MV leaflets, mild AS, mild TR, trace PR, RVSP 46 mmHg, trivial pericardial effusion. c.) TTE 03/26/2018: EF 60-65%; mild LA dilation, mod AS (MPG 30 mmHG); G2DD. d.) TTE 06/08/2021: EF 60-65%; severe AS (MPG 44.5 mmHg); G1DD.  ? Dyslipidemia   ? Essential hypertension   ? Gastroesophageal reflux disease   ? HLD (hyperlipidemia)   ? HOH (hard of hearing)   ? NSTEMI (non-ST elevated myocardial infarction) ([place]) 11/17/2014  ? a.) TTE with EF  50-55%; G3DD; severe dilated LA; mod AS; stress testing revealed mild ischemia in the mid anterolateral, mid anterior, basal and lateral, and basal anterior segments; LHC recommended, however patient declined opting for medical mgmt.  ? Sleep apnea   ? Stroke [facility])   ? ? ?Surgical History: ?Past Surgical History:  ?Procedure Laterality Date  ? HERNIA REPAIR    ? KNEE SURGERY    ? PROSTATE BIOPSY N/A 06/27/2021  ? Procedure: PROSTATE BIOPSY;  Surgeon: Abbie Sons, MD;  Location: [facility] ORS;  Service: Urology;  Laterality: N/A;  ? TRANSRECTAL ULTRASOUND N/A 06/27/2021  ? Procedure: TRANSRECTAL ULTRASOUND;  Surgeon: Abbie Sons, MD;  Location: [facility] ORS;  Service: Urology;  Laterality: N/A;  ? ? ?Home Medications:  ?Allergies as of 07/05/2021   ?No Known Allergies ?  ? ?  ?Medication List  ?  ? ?  ? Accurate as of July 05, 2021  1:33 PM. If you have any questions, ask your nurse or doctor.  ?  ?  ? ?  ? ?acetaminophen 500 MG tablet ?Commonly known as: TYLENOL ?Take 500 mg by mouth every 6 (six) hours as needed. ?  ?Advair Diskus 250-50 MCG/ACT Aepb ?Generic drug: fluticasone-salmeterol ?Inhale 1 puff into the lungs 2 (two) times daily. ?  ?aspirin 81 MG chewable tablet ?Chew 81 mg by mouth daily. ?  ?atorvastatin 80 MG tablet ?Commonly known as: LIPITOR ?Take 80 mg by mouth at bedtime. ?  ?lisinopril 40 MG tablet ?Commonly known as: ZESTRIL ?Take  40 mg by mouth daily. ?  ?megestrol 20 MG tablet ?Commonly known as: MEGACE ?Take 20 mg by mouth daily. ?  ?thiamine 100 MG tablet ?Commonly known as: Vitamin B-1 ?Take 100 mg by mouth daily. ?  ? ?  ? ? ?Allergies: No Known Allergies ? ?Family History: ?Family History  ?Problem Relation Age of Onset  ? Throat cancer Mother   ? Cancer Father   ? CAD Sister   ? Hypertension Sister   ? Diabetes Brother   ? ? ?Social History:  reports that he quit smoking about 4 months ago. His smoking use included cigarettes. He has a 28.50 pack-year smoking history. His smokeless tobacco  use includes snuff. He reports current alcohol use. He reports that he does not use drugs. ? ? ?Physical Exam: ?BP (!) 80/54   Pulse 99   Ht '5\' 8"'$  (1.727 m)   Wt 110 lb (49.9 kg)   BMI 16.73 kg/m?   ?Constitutional:  Alert, No acute distress. ? ? ?Assessment & Plan:   ? ?1.  Prostate cancer ?Based on abnormal DRE and PSA level he most likely has higher grade disease that was not detected on biopsy ?The report was discussed in detail with Mr. Balingit and his caregiver ?We will proceed with staging evaluation to include CT abdomen/pelvis and bone scan ?They will be notified with results and further recommendations at that time ? ? ?Abbie Sons, MD ? ?[place] ?[address] ?[place], [place] [address] ?[phone] ? ?

## 2021-07-07 ENCOUNTER — Encounter: Payer: Self-pay | Admitting: Urology

## 2021-07-10 ENCOUNTER — Ambulatory Visit (INDEPENDENT_AMBULATORY_CARE_PROVIDER_SITE_OTHER): Payer: Medicare Other | Admitting: Internal Medicine

## 2021-07-10 ENCOUNTER — Telehealth: Payer: Self-pay

## 2021-07-10 NOTE — Telephone Encounter (Signed)
Called and spoke with Madelynn Done, patient's caregiver.  ? ?Advised that patient was to be seen in [place] on 4/13 and again today, and was a no show for both appointments.  ? ?Madelynn Done advised that he didn't know or had forgotten about today's appointment, and DSS had come to take the patient to another appointment this morning.  ? ?Caregiver asked if there was any way he could be seen in [place]. Explained that the structural heart team only sees patient's in [place]. Clement verbalized understanding.  ? ?He is requesting a callback at [phone] to have the appointment rescheduled, and apologizes that today's appointment was missed.  ?

## 2021-07-10 NOTE — Telephone Encounter (Signed)
Pt was referred to the structural heart team for TAVR evaluation by Dr Rockey Situ.  Initial consultation was scheduled on 4/13 and the pt no showed. I called and spoke with legal guardian and rescheduled appointment to today, 4/24 at 11:00 AM.  The pt also no showed for this appointment.  Dr Ali Lowe requested that I make Dr Rockey Situ aware that this pt has no showed x 2 for structural heart evaluation.  The pt does not have any pending cardiology follow-up at this time.  ?

## 2021-07-10 NOTE — Progress Notes (Signed)
? ? ?  PATIENT DID NOT APPEAR FOR APPOINTMENT ? ?FOCUSED CARDIOVASCULAR PROBLEM LIST:   ?1.  Severe aortic stenosis with an aortic valve area 0.66 cm grade, mean gradient of 44.5 mmHg and peak velocity of 4.7 m/s with normal ejection fraction; no conduction abnormalities ?2.  COPD; prior remote smoking ?3.  Hypertension ?4.  Hyperlipidemia ?5.  Prostate cancer recently diagnosed; staging underway ? ? ?HISTORY OF PRESENT ILLNESS: ?The patient is a 76 y.o. male with the indicated medical history here for recommendations regarding his aortic valvular disease.  He was seen by Dr. Rockey Situ recently for preoperative indication for a possible biopsy.  At that visit he was noted to be doing relatively well without chest pain or shortness of breath.  An echocardiogram was performed which are reviewed and demonstrated progression of his moderate aortic stenosis to severe aortic stenosis.  He was referred for his biopsy procedure which demonstrated likely high-grade adenocarcinoma.  He is referred for bone scan and CT imaging to better stage him and determine his prognosis. ? ? ? ? ? ?DATA AND STUDIES: ? ?EKG: March 2023 sinus rhythm with left ventricular hypertrophy ? ?2D ECHO: March 2023 highly calcified aortic valve with severe aortic stenosis and normal ejection fraction with no significant other valvular abnormalities ? ?CARDIAC CATH: Pending ? ?STS RISK CALCULATOR: Pending ? ?NHYA CLASS:  ? ? ? ?ASSESSMENT AND PLAN:  ? ?Severe aortic valve stenosis ? ?Prostate cancer ([place]) ? ?Chronic obstructive pulmonary disease, unspecified COPD type ([place]) ? ? ? ? ?Early Osmond, MD  ?07/10/2021 7:18 AM    ?[place] ?[place], [place], [place]  [address] ?Phone: [phone]; Fax: [phone]  ? ? ? ?

## 2021-07-13 NOTE — Progress Notes (Deleted)
Patient ID: Adam Good MRN: [id number] DOB/AGE: April 04, 1945 76 y.o.  Primary Care Physician:Matrone, Mitzi Hansen, NP Primary Cardiologist: Ida Rogue, MD   FOCUSED CARDIOVASCULAR PROBLEM LIST:   1.  Severe aortic stenosis with an aortic valve area 0.66 cm grade, mean gradient of 44.5 mmHg and peak velocity of 4.7 m/s with normal ejection fraction; no conduction abnormalities 2.  COPD; prior remote smoking 3.  Hypertension 4.  Hyperlipidemia 5.  Prostate cancer recently diagnosed; staging underway     HISTORY OF PRESENT ILLNESS: The patient is a 76 y.o. male with the indicated medical history here for recommendations regarding his aortic valvular disease.  He was seen by Dr. Rockey Situ recently for preoperative indication for a possible biopsy.  At that visit he was noted to be doing relatively well without chest pain or shortness of breath.  An echocardiogram was performed which are reviewed and demonstrated progression of his moderate aortic stenosis to severe aortic stenosis.  He was referred for his biopsy procedure which demonstrated likely high-grade adenocarcinoma.  He is referred for bone scan and CT imaging to better stage him and determine his prognosis  Past Medical History:  Diagnosis Date   Alcohol abuse    Aortic stenosis 06/04/2017   a.) TTE 06/04/2017: mild; MPG 20 mmHg. b.) TTE 08/14/209: mild-mod: MPG 22 mmHg. c.) TTE 03/26/2018: mod: MPG 30 mmHg. d.) TTE 06/15/2021: severe; MPG 44.5 mmHG   Bipolar disorder (HCC)    Cardiac murmur    COPD (chronic obstructive pulmonary disease) ([place])    Demand ischemia of myocardium ([place]) 01/2014   a.) positive troponins in the setting of a fall during intoxication; b.) Myoview 2/16: EF 65-70%, no evi of ischemia or infarct. No RWMA.; c.) MV 9/16: mild ischemia in the mid anterolateral, mid anterior, basal anterolateral and basal anterior segments, patient declined LHC   Dementia ([place])    Diastolic dysfunction    a.) TTE 9/16: EF  of 50-55%, G2DD, severely dilated LA, and moderate AS; b.) TTE 3/19: EF of 50-55%, G1DD, mildly dilated LA, mildly calcified MV leaflets, mild AS, mild TR, trace PR, RVSP 46 mmHg, trivial pericardial effusion. c.) TTE 03/26/2018: EF 60-65%; mild LA dilation, mod AS (MPG 30 mmHG); G2DD. d.) TTE 06/08/2021: EF 60-65%; severe AS (MPG 44.5 mmHg); G1DD.   Dyslipidemia    Essential hypertension    Gastroesophageal reflux disease    HLD (hyperlipidemia)    HOH (hard of hearing)    NSTEMI (non-ST elevated myocardial infarction) ([place]) 11/17/2014   a.) TTE with EF 50-55%; G3DD; severe dilated LA; mod AS; stress testing revealed mild ischemia in the mid anterolateral, mid anterior, basal and lateral, and basal anterior segments; LHC recommended, however patient declined opting for medical mgmt.   Sleep apnea    Stroke [facility])     Past Surgical History:  Procedure Laterality Date   HERNIA REPAIR     KNEE SURGERY     PROSTATE BIOPSY N/A 06/27/2021   Procedure: PROSTATE BIOPSY;  Surgeon: Abbie Sons, MD;  Location: [facility] ORS;  Service: Urology;  Laterality: N/A;   TRANSRECTAL ULTRASOUND N/A 06/27/2021   Procedure: TRANSRECTAL ULTRASOUND;  Surgeon: Abbie Sons, MD;  Location: [facility] ORS;  Service: Urology;  Laterality: N/A;    Family History  Problem Relation Age of Onset   Throat cancer Mother    Cancer Father    CAD Sister    Hypertension Sister    Diabetes Brother     Social History  Socioeconomic History   Marital status: Single    Spouse name: Not on file   Number of children: Not on file   Years of education: Not on file   Highest education level: Not on file  Occupational History   Not on file  Tobacco Use   Smoking status: Former    Packs/day: 0.50    Years: 57.00    Pack years: 28.50    Types: Cigarettes    Quit date: 02/2021    Years since quitting: 0.4   Smokeless tobacco: Current    Types: Snuff  Vaping Use   Vaping Use: Former  Substance and Sexual Activity    Alcohol use: Yes    Alcohol/week: 0.0 standard drinks    Comment: He has been a heavy alcohol drinker for > 30 yrs.  Stats that he quit 3 weeks ago.    Drug use: No   Sexual activity: Not on file  Other Topics Concern   Not on file  Social History Narrative   Not on file   Social Determinants of Health   Financial Resource Strain: Not on file  Food Insecurity: Not on file  Transportation Needs: Not on file  Physical Activity: Not on file  Stress: Not on file  Social Connections: Not on file  Intimate Partner Violence: Not on file     Prior to Admission medications   Medication Sig Start Date End Date Taking? Authorizing Provider  acetaminophen (TYLENOL) 500 MG tablet Take 500 mg by mouth every 6 (six) hours as needed.    [provider]  ADVAIR DISKUS 250-50 MCG/ACT AEPB Inhale 1 puff into the lungs 2 (two) times daily. 05/30/21   [provider]  aspirin 81 MG chewable tablet Chew 81 mg by mouth daily.    [provider]  atorvastatin (LIPITOR) 80 MG tablet Take 80 mg by mouth at bedtime. 05/18/21   [provider]  lisinopril (ZESTRIL) 40 MG tablet Take 40 mg by mouth daily. 05/18/21   [provider]  megestrol (MEGACE) 20 MG tablet Take 20 mg by mouth daily. 06/01/21   [provider]  thiamine (VITAMIN B-1) 100 MG tablet Take 100 mg by mouth daily.    [provider]    No Known Allergies  REVIEW OF SYSTEMS:  General: no fevers/chills/night sweats Eyes: no blurry vision, diplopia, or amaurosis ENT: no sore throat or hearing loss Resp: no cough, wheezing, or hemoptysis CV: no edema or palpitations GI: no abdominal pain, nausea, vomiting, diarrhea, or constipation GU: no dysuria, frequency, or hematuria Skin: no rash Neuro: no headache, numbness, tingling, or weakness of extremities Musculoskeletal: no joint pain or swelling Heme: no bleeding, DVT, or easy bruising Endo: no polydipsia or polyuria  There were  no vitals taken for this visit.  PHYSICAL EXAM: GEN:  AO x 3 in no acute distress HEENT: normal Dentition: Normal*** Neck: JVP normal. +2***carotid upstrokes without bruits. No thyromegaly. Lungs: equal expansion, clear bilaterally CV: Apex is discrete and nondisplaced, RRR without murmur or gallop*** Abd: soft, non-tender, non-distended; no bruit; positive bowel sounds Ext: no edema, ecchymoses, or cyanosis Vascular: 2+ femoral pulses, 2+ radial pulses       Skin: warm and dry without rash Neuro: CN II-XII grossly intact; motor and sensory grossly intact    DATA AND STUDIES:   EKG: March 2023 sinus rhythm with left ventricular hypertrophy   2D ECHO: March 2023 highly calcified aortic valve with severe aortic stenosis and normal ejection fraction  with no significant other valvular abnormalities   CARDIAC CATH: Pending   STS RISK CALCULATOR: Pending   NHYA CLASS: ***       ASSESSMENT AND PLAN:    Severe aortic valve stenosis   Prostate cancer (HCC)   Chronic obstructive pulmonary disease, unspecified COPD type ([place])   The patient has developed severe aortic stenosis.  A complicating factor is the recent diagnosis of prostate cancer of unknown stage.  We will normally relatively indolent neoplasm we will need to obtain a better idea patient's stage of disease and his prognosis.  I have personally reviewed the patients imaging data as summarized above.  I have reviewed the natural history of aortic stenosis with the patient and family members who are present today. We have discussed the limitations of medical therapy and the poor prognosis associated with symptomatic aortic stenosis. We have also reviewed potential treatment options, including palliative medical therapy, conventional surgical aortic valve replacement, and transcatheter aortic valve replacement. We discussed treatment options in the context of this patient's specific comorbid medical conditions.   All of the  patient's questions were answered today. Will make further recommendations based on the results of studies outlined above.   Total time spent with patient today *** minutes. This includes reviewing records, evaluating the patient and coordinating care.   Early Osmond, MD  07/13/2021 1:02 PM    [facility] [place], [place], [place]  [address] Phone: [phone]; Fax: [phone]

## 2021-07-19 ENCOUNTER — Institutional Professional Consult (permissible substitution): Payer: Medicare Other | Admitting: Internal Medicine

## 2021-07-19 ENCOUNTER — Encounter: Payer: Self-pay | Admitting: Internal Medicine

## 2021-07-19 DIAGNOSIS — C61 Malignant neoplasm of prostate: Secondary | ICD-10-CM

## 2021-07-19 DIAGNOSIS — J449 Chronic obstructive pulmonary disease, unspecified: Secondary | ICD-10-CM

## 2021-07-19 DIAGNOSIS — I350 Nonrheumatic aortic (valve) stenosis: Secondary | ICD-10-CM

## 2021-07-19 NOTE — Telephone Encounter (Signed)
Pt was scheduled for Structural Heart Evaluation today at 11:00 AM with Dr Ali Lowe.  Pt no showed for this appointment and this is the third no show for TAVR evaluation.  Nell Range PA-C spoke with Madelynn Done (pt's caregiver) on 4/24 and he assured Joellen Jersey that the pt would be at today's appointment.   I will forward this message to Dr Rockey Situ to make him aware.  ?

## 2021-07-19 NOTE — Addendum Note (Signed)
Addended by: Lenna Sciara on: 07/19/2021 03:23 PM ? ? Modules accepted: Level of Service ? ?

## 2021-09-26 ENCOUNTER — Ambulatory Visit
Admission: RE | Admit: 2021-09-26 | Discharge: 2021-09-26 | Disposition: A | Discharge status: Home / Self Care | Payer: Medicare Other | Source: Ambulatory Visit | Attending: Urology | Admitting: Urology

## 2021-09-26 ENCOUNTER — Encounter
Admission: RE | Admit: 2021-09-26 | Discharge: 2021-09-26 | Disposition: A | Discharge status: Home / Self Care | Payer: Medicare Other | Source: Ambulatory Visit | Attending: Urology | Admitting: Urology

## 2021-09-26 DIAGNOSIS — C61 Malignant neoplasm of prostate: Secondary | ICD-10-CM | POA: Insufficient documentation

## 2021-09-26 MED ORDER — TECHNETIUM TC 99M MEDRONATE IV KIT
20.0000 | PACK | Freq: Once | INTRAVENOUS | Status: AC | PRN
  Administered 2021-09-26: 19.58 via INTRAVENOUS

## 2021-09-29 ENCOUNTER — Telehealth: Payer: Self-pay | Admitting: Family Medicine

## 2021-09-29 NOTE — Telephone Encounter (Signed)
Patient's caregiver notified and appointment for CT results has been made.

## 2021-09-29 NOTE — Telephone Encounter (Signed)
-----   Message from Abbie Sons, MD sent at 09/29/2021  7:16 AM EDT ----- Bone scan was abnormal.  He is scheduled for CT scan 7/17.  Please schedule follow-up visit sometime after the 17th to discuss results.  Would contact his group home head-Clement Sowa at [phone]

## 2021-10-02 ENCOUNTER — Ambulatory Visit
Admission: RE | Admit: 2021-10-02 | Discharge: 2021-10-02 | Disposition: A | Discharge status: Home / Self Care | Payer: Medicare Other | Source: Ambulatory Visit | Attending: Urology | Admitting: Urology

## 2021-10-02 DIAGNOSIS — C61 Malignant neoplasm of prostate: Secondary | ICD-10-CM | POA: Insufficient documentation

## 2021-10-02 LAB — POCT I-STAT CREATININE: Creatinine, Ser: 1.2 mg/dL (ref 0.61–1.24)

## 2021-10-02 MED ORDER — IOHEXOL 300 MG/ML  SOLN
75.0000 mL | Freq: Once | INTRAMUSCULAR | Status: AC | PRN
  Administered 2021-10-02: 75 mL via INTRAVENOUS

## 2021-10-11 ENCOUNTER — Ambulatory Visit: Payer: Medicare Other | Admitting: Urology

## 2021-10-15 ENCOUNTER — Telehealth: Payer: Self-pay | Admitting: Urology

## 2021-10-15 NOTE — Telephone Encounter (Signed)
Patient was a no-show for 7/26 appointment.  Please call group home.  To reschedule. Madelynn Done [phone] (group home head)

## 2021-10-16 NOTE — Telephone Encounter (Signed)
Spoke to [place] and appointment has been rescheduled.

## 2021-10-25 ENCOUNTER — Encounter: Payer: Self-pay | Admitting: Urology

## 2021-10-25 ENCOUNTER — Ambulatory Visit (INDEPENDENT_AMBULATORY_CARE_PROVIDER_SITE_OTHER): Payer: Medicare Other | Admitting: Urology

## 2021-10-25 VITALS — BP 92/61 | HR 90 | Ht 68.0 in | Wt 106.0 lb

## 2021-10-25 DIAGNOSIS — C61 Malignant neoplasm of prostate: Secondary | ICD-10-CM | POA: Diagnosis not present

## 2021-10-25 NOTE — Progress Notes (Unsigned)
10/25/2021 9:24 AM   Adam Good 04/02/1945 [id number]  Referring provider: Terrill Mohr, NP [name],  [place] [address]  Chief Complaint  Patient presents with   Follow-up    HPI: 76 y.o. male presents for prostate biopsy follow-up.  He presents today with group home caregiver  TRUS/biopsy performed same-day surgery under sedation 06/27/21 No postbiopsy complaints small volume prostate calculated at only 11 cc Firm right prostate DRE PSA 195 Based on PSA level and medical comorbidities only sextant biopsies were performed Pathology: 5/6 cores with Gleason 3+3 adenocarcinoma Bone scan with multiple mets thoracolumbar spine, pelvis and ribs CT abdomen/pelvis with numerous hypodense liver lesions felt consistent with metastasis, diffuse lytic/sclerotic osseous metastatic disease and retroperitoneal lymphadenopathy   PMH: Past Medical History:  Diagnosis Date   Alcohol abuse    Aortic stenosis 06/04/2017   a.) TTE 06/04/2017: mild; MPG 20 mmHg. b.) TTE 08/14/209: mild-mod: MPG 22 mmHg. c.) TTE 03/26/2018: mod: MPG 30 mmHg. d.) TTE 06/15/2021: severe; MPG 44.5 mmHG   Bipolar disorder (HCC)    Cardiac murmur    COPD (chronic obstructive pulmonary disease) ([place])    Demand ischemia of myocardium ([place]) 01/2014   a.) positive troponins in the setting of a fall during intoxication; b.) Myoview 2/16: EF 65-70%, no evi of ischemia or infarct. No RWMA.; c.) MV 9/16: mild ischemia in the mid anterolateral, mid anterior, basal anterolateral and basal anterior segments, patient declined LHC   Dementia ([place])    Diastolic dysfunction    a.) TTE 9/16: EF of 50-55%, G2DD, severely dilated LA, and moderate AS; b.) TTE 3/19: EF of 50-55%, G1DD, mildly dilated LA, mildly calcified MV leaflets, mild AS, mild TR, trace PR, RVSP 46 mmHg, trivial pericardial effusion. c.) TTE 03/26/2018: EF 60-65%; mild LA dilation, mod AS (MPG 30 mmHG); G2DD. d.) TTE 06/08/2021: EF 60-65%;  severe AS (MPG 44.5 mmHg); G1DD.   Dyslipidemia    Essential hypertension    Gastroesophageal reflux disease    HLD (hyperlipidemia)    HOH (hard of hearing)    NSTEMI (non-ST elevated myocardial infarction) ([place]) 11/17/2014   a.) TTE with EF 50-55%; G3DD; severe dilated LA; mod AS; stress testing revealed mild ischemia in the mid anterolateral, mid anterior, basal and lateral, and basal anterior segments; LHC recommended, however patient declined opting for medical mgmt.   Sleep apnea    Stroke [facility])     Surgical History: Past Surgical History:  Procedure Laterality Date   HERNIA REPAIR     KNEE SURGERY     PROSTATE BIOPSY N/A 06/27/2021   Procedure: PROSTATE BIOPSY;  Surgeon: Abbie Sons, MD;  Location: [facility] ORS;  Service: Urology;  Laterality: N/A;   TRANSRECTAL ULTRASOUND N/A 06/27/2021   Procedure: TRANSRECTAL ULTRASOUND;  Surgeon: Abbie Sons, MD;  Location: [facility] ORS;  Service: Urology;  Laterality: N/A;    Home Medications:  Allergies as of 10/25/2021   No Known Allergies      Medication List        Accurate as of October 25, 2021  9:24 AM. If you have any questions, ask your nurse or doctor.          acetaminophen 500 MG tablet Commonly known as: TYLENOL Take 500 mg by mouth every 6 (six) hours as needed.   Advair Diskus 250-50 MCG/ACT Aepb Generic drug: fluticasone-salmeterol Inhale 1 puff into the lungs 2 (two) times daily.   aspirin 81 MG chewable tablet Chew 81 mg by mouth  daily.   atorvastatin 80 MG tablet Commonly known as: LIPITOR Take 80 mg by mouth at bedtime.   lisinopril 40 MG tablet Commonly known as: ZESTRIL Take 40 mg by mouth daily.   megestrol 20 MG tablet Commonly known as: MEGACE Take 20 mg by mouth daily.   thiamine 100 MG tablet Commonly known as: Vitamin B-1 Take 100 mg by mouth daily.        Allergies: No Known Allergies  Family History: Family History  Problem Relation Age of Onset   Throat cancer Mother     Cancer Father    CAD Sister    Hypertension Sister    Diabetes Brother     Social History:  reports that he quit smoking about 8 months ago. His smoking use included cigarettes. He has a 28.50 pack-year smoking history. His smokeless tobacco use includes snuff. He reports current alcohol use. He reports that he does not use drugs.   Physical Exam: BP 92/61   Pulse 90   Ht '5\' 8"'$  (1.727 m)   Wt 106 lb (48.1 kg)   BMI 16.12 kg/m   Constitutional:  Alert, No acute distress.   Assessment & Plan:    1.  Prostate cancer Discussed with patient and head of group home diagnosis of metastatic prostate cancer Recommend medical oncology referral regarding recommendations of any additional therapy besides ADT Would recommend [place]agon as initial ADT Hepatic metastasis not common with prostate cancer and we will also ask medical oncology if any further evaluation recommended   Abbie Sons, MD  [place] [address], [place], [place] [address] [phone]

## 2021-10-26 DIAGNOSIS — C61 Malignant neoplasm of prostate: Secondary | ICD-10-CM | POA: Insufficient documentation

## 2021-10-26 NOTE — Assessment & Plan Note (Addendum)
Patient has dementia and was not able to understand the diagnosis of cancer and the treatment options.  I did place a call to Adam Good, to speak with his legal guardian about further management.  I have left a voicemail with my cell number and waiting for callback. I will finalize the plan after I speak with his legal guardian about goals of care and treatment. I also called and left voicemail for Adam Good who is listed as second contact person. Pt is not consentable.   Personally reviewed CT abdo and bone scan which showed numerous hepatic mets, bony mets and RN lymph node enlargement. 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  He did have a prostate biopsy which showed Gleason 3+3 adenocarcinoma.  However I do not think it is a true representation of his aggressive disease.  Of note, he was found to have PSA of 194 in March 2023.  With his elevated PSA and other comorbidities such as severe aortic stenosis and dementia, I think we can forego another biopsy and treat as metastatic prostate cancer. Considering he has high tumor burden and bony spine mets, he will benefit from [place] with loading dose of 240 mg once followed by 120 mg monthly to avoid flare phenomenon.  Subsequently, we will transition him to Lupron due to better dosing interval. There is a data with overall survival benefit with use of docetaxel in high volume disease. However, this patient has poor performance status and is not a candidate for chemotherapy options. He may be able to tolerate Abiraterone/prednisone and there is PFS and OS benefit based on LATITUDE and STAMPEDE trials. But I will start with [place] first and see how he tolerates then introduce abiraterone.   I will discuss the side effects such as mood changes, osteoporosis, hot flashes, fatigue, HTN, HLD, weight gain, CV risk, changes in electrolytes and hepatotoxicity.   CT chest w contrast to complete staging  process.  Labs today - Psa, testosterone, cbc, cmp Down the line, will get DEXA scan for baseline then every 2 years to monitor bone health   On my chart review, there is no advance care directives. I will place Palliative Care referral to assist with that.

## 2021-11-01 ENCOUNTER — Inpatient Hospital Stay: Payer: Medicare Other | Attending: Internal Medicine | Admitting: Internal Medicine

## 2021-11-01 ENCOUNTER — Telehealth: Payer: Self-pay | Admitting: *Deleted

## 2021-11-01 ENCOUNTER — Inpatient Hospital Stay: Payer: Medicare Other

## 2021-11-01 ENCOUNTER — Encounter: Payer: Self-pay | Admitting: Internal Medicine

## 2021-11-01 VITALS — BP 94/63 | HR 89 | Temp 97.6°F | Resp 16 | Ht 68.0 in | Wt 108.8 lb

## 2021-11-01 DIAGNOSIS — E785 Hyperlipidemia, unspecified: Secondary | ICD-10-CM | POA: Diagnosis not present

## 2021-11-01 DIAGNOSIS — C7951 Secondary malignant neoplasm of bone: Secondary | ICD-10-CM | POA: Diagnosis not present

## 2021-11-01 DIAGNOSIS — M549 Dorsalgia, unspecified: Secondary | ICD-10-CM | POA: Diagnosis not present

## 2021-11-01 DIAGNOSIS — C787 Secondary malignant neoplasm of liver and intrahepatic bile duct: Secondary | ICD-10-CM | POA: Diagnosis not present

## 2021-11-01 DIAGNOSIS — C61 Malignant neoplasm of prostate: Secondary | ICD-10-CM | POA: Diagnosis present

## 2021-11-01 DIAGNOSIS — M545 Low back pain, unspecified: Secondary | ICD-10-CM

## 2021-11-01 DIAGNOSIS — C772 Secondary and unspecified malignant neoplasm of intra-abdominal lymph nodes: Secondary | ICD-10-CM | POA: Diagnosis not present

## 2021-11-01 DIAGNOSIS — Z87891 Personal history of nicotine dependence: Secondary | ICD-10-CM | POA: Insufficient documentation

## 2021-11-01 DIAGNOSIS — R29898 Other symptoms and signs involving the musculoskeletal system: Secondary | ICD-10-CM | POA: Diagnosis not present

## 2021-11-01 DIAGNOSIS — I10 Essential (primary) hypertension: Secondary | ICD-10-CM | POA: Diagnosis not present

## 2021-11-01 DIAGNOSIS — R531 Weakness: Secondary | ICD-10-CM | POA: Diagnosis not present

## 2021-11-01 LAB — COMPREHENSIVE METABOLIC PANEL
ALT: 26 U/L (ref 0–44)
AST: 44 U/L — ABNORMAL HIGH (ref 15–41)
Albumin: 3.6 g/dL (ref 3.5–5.0)
Alkaline Phosphatase: 459 U/L — ABNORMAL HIGH (ref 38–126)
Anion gap: 8 (ref 5–15)
BUN: 22 mg/dL (ref 8–23)
CO2: 24 mmol/L (ref 22–32)
Calcium: 8.7 mg/dL — ABNORMAL LOW (ref 8.9–10.3)
Chloride: 107 mmol/L (ref 98–111)
Creatinine, Ser: 1.08 mg/dL (ref 0.61–1.24)
GFR, Estimated: >60 mL/min (ref >60)
Glucose, Bld: 92 mg/dL (ref 70–99)
Potassium: 3.8 mmol/L (ref 3.5–5.1)
Sodium: 139 mmol/L (ref 135–145)
Total Bilirubin: 1.1 mg/dL (ref 0.3–1.2)
Total Protein: 6.1 g/dL — ABNORMAL LOW (ref 6.5–8.1)

## 2021-11-01 LAB — CBC WITH DIFFERENTIAL/PLATELET
Abs Immature Granulocytes: 0.03 10*3/uL (ref 0.00–0.07)
Basophils Absolute: 0.1 10*3/uL (ref 0.0–0.1)
Basophils Relative: 1 %
Eosinophils Absolute: 0.3 10*3/uL (ref 0.0–0.5)
Eosinophils Relative: 4 %
HCT: 36.5 % — ABNORMAL LOW (ref 39.0–52.0)
Hemoglobin: 11.7 g/dL — ABNORMAL LOW (ref 13.0–17.0)
Immature Granulocytes: 0 %
Lymphocytes Relative: 31 %
Lymphs Abs: 2.4 10*3/uL (ref 0.7–4.0)
MCH: 30.8 pg (ref 26.0–34.0)
MCHC: 32.1 g/dL (ref 30.0–36.0)
MCV: 96.1 fL (ref 80.0–100.0)
Monocytes Absolute: 0.7 10*3/uL (ref 0.1–1.0)
Monocytes Relative: 9 %
Neutro Abs: 4.1 10*3/uL (ref 1.7–7.7)
Neutrophils Relative %: 55 %
Platelets: 237 10*3/uL (ref 150–400)
RBC: 3.8 MIL/uL — ABNORMAL LOW (ref 4.22–5.81)
RDW: 15.1 % (ref 11.5–15.5)
WBC: 7.5 10*3/uL (ref 4.0–10.5)
nRBC: 0 % (ref 0.0–0.2)

## 2021-11-01 LAB — PSA: Prostatic Specific Antigen: 151.82 ng/mL — ABNORMAL HIGH (ref 0.00–4.00)

## 2021-11-01 NOTE — Assessment & Plan Note (Signed)
-   likely secondary to metastatic disease - continue with tramadol 50 mg q6 prn (prescribed by provider at group home)

## 2021-11-01 NOTE — Assessment & Plan Note (Signed)
-   can worsen with ADT treatment. Will closely monitor - continue with lisinopril 40 mg daily

## 2021-11-01 NOTE — Assessment & Plan Note (Signed)
-   can worsen on ADT. Will check lipid panel once ADT is started - continue with lipitor 80 mg daily

## 2021-11-01 NOTE — Assessment & Plan Note (Signed)
-   unclear baseline. Patient and caregiver could not provide information.  - he does have metastatic prostate cancer.  - Ordered MRI LS spine with and without contrast to rule out metastatic involvement.

## 2021-11-01 NOTE — Telephone Encounter (Signed)
I called at 11:30 am and got a message that it is Teodoro Kil and left her a message to call back to speak to MD about what we can do to help him with prostate cancer dx. Left my direct number

## 2021-11-01 NOTE — Progress Notes (Signed)
Adam Good CONSULT NOTE  Patient Care Team: Adam Mohr, NP as PCP - General (Nurse Practitioner)  REFERRING PROVIDER: Dr. Bernardo Good  REASON FOR REFFERAL: prostate cancer  CANCER STAGING  Stage IV prostate cancer with metastasis to bones, liver, lymph nodes   ASSESSMENT & PLAN:  Prostate cancer metastatic to multiple sites [facility]) Patient has dementia and was not able to understand the diagnosis of cancer and the treatment options.  I did place a call to [place], to speak with his legal guardian about further management.  I have left a voicemail with my cell number and waiting for callback. I will finalize the plan after I speak with his legal guardian about goals of care and treatment. I also called and left voicemail for Adam Good who is listed as second contact person. Pt is not consentable.   Personally reviewed CT abdo and bone scan which showed numerous hepatic mets, bony mets and RN lymph node enlargement. 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  He did have a prostate biopsy which showed Gleason 3+3 adenocarcinoma.  However I do not think it is a true representation of his aggressive disease.  Of note, he was found to have PSA of 194 in March 2023.  With his elevated PSA and other comorbidities such as severe aortic stenosis and dementia, I think we can forego another biopsy and treat as metastatic prostate cancer. Considering he has high tumor burden and bony spine mets, he will benefit from [place] with loading dose of 240 mg once followed by 120 mg monthly to avoid flare phenomenon.  Subsequently, we will transition him to Lupron due to better dosing interval. There is a data with overall survival benefit with use of docetaxel in high volume disease. However, this patient has poor performance status and is not a candidate for chemotherapy options. He may be able to tolerate Abiraterone/prednisone and there is  PFS and OS benefit based on LATITUDE and STAMPEDE trials. But I will start with [place] first and see how he tolerates then introduce abiraterone.   I will discuss the side effects such as mood changes, osteoporosis, hot flashes, fatigue, HTN, HLD, weight gain, CV risk, changes in electrolytes and hepatotoxicity.   CT chest w contrast to complete staging process.  Labs today - Psa, testosterone, cbc, cmp Down the line, will get DEXA scan for baseline then every 2 years to monitor bone health   On my chart review, there is no advance care directives. I will place Palliative Care referral to assist with that.    Essential hypertension - can worsen with ADT treatment. Will closely monitor - continue with lisinopril 40 mg daily  Dyslipidemia - can worsen on ADT. Will check lipid panel once ADT is started - continue with lipitor 80 mg daily  Weakness of right lower extremity - unclear baseline. Patient and caregiver could not provide information.  - he does have metastatic prostate cancer.  - Ordered MRI LS spine with and without contrast to rule out metastatic involvement.   Back pain - likely secondary to metastatic disease - continue with tramadol 50 mg q6 prn (prescribed by provider at group home)  Orders Placed This Encounter  Procedures   CT CHEST W CONTRAST    Standing Status:   Future    Standing Expiration Date:   11/02/2022    Order Specific Question:   If indicated for the ordered procedure, I authorize the  administration of contrast media per Radiology protocol    Answer:   Yes    Order Specific Question:   Preferred imaging location?    Answer:   [facility]    Order Specific Question:   Radiology Contrast Protocol - do NOT remove file path    Answer:   [email or web address]   MR Lumbar Spine W Wo Contrast    Standing Status:   Future    Standing Expiration Date:   11/01/2022    Order Specific Question:   If indicated for the  ordered procedure, I authorize the administration of contrast media per Radiology protocol    Answer:   Yes    Order Specific Question:   What is the patient's sedation requirement?    Answer:   No Sedation    Order Specific Question:   Does the patient have a pacemaker or implanted devices?    Answer:   No    Order Specific Question:   Use SRS Protocol?    Answer:   No    Order Specific Question:   Preferred imaging location?    Answer:   [facility] (table limit - 550lbs)   PSA    Standing Status:   Future    Number of Occurrences:   1    Standing Expiration Date:   11/02/2022   Testosterone    Standing Status:   Future    Number of Occurrences:   1    Standing Expiration Date:   11/01/2022   CBC with Differential    Standing Status:   Future    Number of Occurrences:   1    Standing Expiration Date:   11/01/2022   Comprehensive metabolic panel    Standing Status:   Future    Number of Occurrences:   1    Standing Expiration Date:   11/01/2022   Ambulatory Referral to Palliative Care    Referral Priority:   Routine    Referral Type:   Consultation    Referral Reason:   Advance Care Planning    Number of Visits Requested:   1    RTC - TBD after discussing with guardian  The total time spent in the appointment was 70 minutes encounter with patients including review of chart and various tests results, discussions about plan of care and coordination of care plan   All questions were answered. The patient knows to call the clinic with any problems, questions or concerns. No barriers to learning was detected.  Adam Canary, MD 8/16/20233:53 PM   HISTORY OF PRESENTING ILLNESS:  Adam Good 76 y.o. male is here because of new diagnosis of metastatic prostate cancer.  Patient was seen today with care provider from [facility].  Patient has dementia and is hard of hearing.  He was unable to answer my questions appropriately.  He was not aware about the  reason for his visit today and that he has a diagnosis of cancer.  He did mention he has pain in his mid and lower back which is worse in the morning.  He is on tramadol.  Per caregiver, he uses cane to ambulate.  Denies any urinary or bowel incontinence.  I have reviewed his chart and materials related to his cancer extensively and collaborated history with the patient. Summary of oncologic history is as follows: Oncology History Overview Note  Metastatic prostate cancer to liver, bones and retroperitoneal lymph nodes   Prostate cancer metastatic to multiple  sites [facility])  06/08/2021 Initial Diagnosis   Initial visit with Dr. Bernardo Good of Urology for elevated PSA of 194.74   06/27/2021 Procedure   TRUS/transrectal prostate biopsies.    06/27/2021 Pathology Results   Diagnostic Summary   [A] PROSTATE, LEFT BASE:   ACINAR ADENOCARCINOMA, GLEASON 3+3=6  (GG  1), INVOLVING 1 OF 1 CORES, MEASURING 4  MM ( 20%).   [B] PROSTATE, LEFT MID:   ACINAR ADENOCARCINOMA, GLEASON 3+3=6  (GG 1),  INVOLVING 1 OF 2 CORES, MEASURING 4  MM ( 25%).   [C] PROSTATE, LEFT APEX:   ACINAR ADENOCARCINOMA, GLEASON 3+3=6  (GG  1), INVOLVING 2 OF 4 CORES, MEASURING 8  MM ( 42%).   [D] PROSTATE, RIGHT BASE:   NEGATIVE FOR MALIGNANCY.   [E] PROSTATE, RIGHT MID:   ACINAR ADENOCARCINOMA, GLEASON 3+3=6  (GG  1), INVOLVING 1 OF 3 CORES, MEASURING 8  MM ( 73%).   [F] PROSTATE, RIGHT APEX:   ACINAR ADENOCARCINOMA, GLEASON 3+3=6  (GG  1), INVOLVING 2 OF 2 CORES, MEASURING 22  MM ( 92%).   10/02/2021 Imaging   CT abdomen/pelvis  IMPRESSION: 1. Diffuse, heterogeneously lytic and sclerotic osseous metastatic disease involving the included axial skeleton. 2. Numerous hypodense metastatic liver lesions. 3. Numerous enlarged metastatic retroperitoneal lymph nodes. 4. Moderate hiatal hernia with intrathoracic position of the gastric fundus. 5. Mild prostatomegaly. 6. Aortic valve calcifications. Correlate for  echocardiographic evidence of aortic valve dysfunction.  Bone scan - There are multiple foci of intense radiopharmaceutical uptake in the bones, highly suspicious for metastatic disease. Prominent uptake is noted throughout the thoracolumbar spine, scattered throughout the pelvis, especially near the left sacroiliac joint, and in multiple ribs (greatest in approximately the posterior aspect of the right 7th rib).     10/26/2021 Cancer Staging   Staging form: Prostate, AJCC 8th Edition - Clinical: Stage IVB (cM1, Grade Group: 1) - Signed by Adam Canary, MD on 10/26/2021 Histologic grading system: 5 grade system     MEDICAL HISTORY:  Past Medical History:  Diagnosis Date   Alcohol abuse    Alzheimer disease ([place])    Aortic stenosis 06/04/2017   a.) TTE 06/04/2017: mild; MPG 20 mmHg. b.) TTE 08/14/209: mild-mod: MPG 22 mmHg. c.) TTE 03/26/2018: mod: MPG 30 mmHg. d.) TTE 06/15/2021: severe; MPG 44.5 mmHG   Bipolar disorder (HCC)    Cardiac murmur    COPD (chronic obstructive pulmonary disease) ([place])    Demand ischemia of myocardium ([place]) 01/2014   a.) positive troponins in the setting of a fall during intoxication; b.) Myoview 2/16: EF 65-70%, no evi of ischemia or infarct. No RWMA.; c.) MV 9/16: mild ischemia in the mid anterolateral, mid anterior, basal anterolateral and basal anterior segments, patient declined LHC   Dementia ([place])    Diastolic dysfunction    a.) TTE 9/16: EF of 50-55%, G2DD, severely dilated LA, and moderate AS; b.) TTE 3/19: EF of 50-55%, G1DD, mildly dilated LA, mildly calcified MV leaflets, mild AS, mild TR, trace PR, RVSP 46 mmHg, trivial pericardial effusion. c.) TTE 03/26/2018: EF 60-65%; mild LA dilation, mod AS (MPG 30 mmHG); G2DD. d.) TTE 06/08/2021: EF 60-65%; severe AS (MPG 44.5 mmHg); G1DD.   Dyslipidemia    Essential hypertension    Gastroesophageal reflux disease    HLD (hyperlipidemia)    HOH (hard of hearing)    NSTEMI (non-ST elevated myocardial  infarction) ([place]) 11/17/2014   a.) TTE with EF 50-55%; G3DD; severe dilated LA; mod AS; stress testing  revealed mild ischemia in the mid anterolateral, mid anterior, basal and lateral, and basal anterior segments; LHC recommended, however patient declined opting for medical mgmt.   Sleep apnea    Stroke ([place])    Vitamin D deficiency disease     SURGICAL HISTORY: Past Surgical History:  Procedure Laterality Date   HERNIA REPAIR     KNEE SURGERY     PROSTATE BIOPSY N/A 06/27/2021   Procedure: PROSTATE BIOPSY;  Surgeon: Abbie Sons, MD;  Location: [facility] ORS;  Service: Urology;  Laterality: N/A;   TRANSRECTAL ULTRASOUND N/A 06/27/2021   Procedure: TRANSRECTAL ULTRASOUND;  Surgeon: Abbie Sons, MD;  Location: [facility] ORS;  Service: Urology;  Laterality: N/A;    SOCIAL HISTORY: Social History   Socioeconomic History   Marital status: Single    Spouse name: Not on file   Number of children: Not on file   Years of education: Not on file   Highest education level: Not on file  Occupational History   Not on file  Tobacco Use   Smoking status: Former    Packs/day: 0.50    Years: 57.00    Total pack years: 28.50    Types: Cigarettes    Quit date: 02/2021    Years since quitting: 0.7   Smokeless tobacco: Current    Types: Snuff  Vaping Use   Vaping Use: Former  Substance and Sexual Activity   Alcohol use: Yes    Alcohol/week: 0.0 standard drinks of alcohol    Comment: He has been a heavy alcohol drinker for > 30 yrs.  Stats that he quit 3 weeks ago.    Drug use: No   Sexual activity: Not on file  Other Topics Concern   Not on file  Social History Narrative   Not on file   Social Determinants of Health   Financial Resource Strain: Not on file  Food Insecurity: Not on file  Transportation Needs: Not on file  Physical Activity: Not on file  Stress: Not on file  Social Connections: Not on file  Intimate Partner Violence: Not on file    FAMILY HISTORY: Family History   Problem Relation Age of Onset   Throat cancer Mother    Cancer Father    CAD Sister    Hypertension Sister    Diabetes Brother     ALLERGIES:  has No Known Allergies.  MEDICATIONS:  Current Outpatient Medications  Medication Sig Dispense Refill   acetaminophen (TYLENOL) 500 MG tablet Take 500 mg by mouth every 6 (six) hours as needed.     ADVAIR DISKUS 250-50 MCG/ACT AEPB Inhale 1 puff into the lungs 2 (two) times daily.     alum & mag hydroxide-simeth (MAALOX/MYLANTA) 200-200-20 MG/5ML suspension Take by mouth every 6 (six) hours as needed for indigestion or heartburn.     aspirin 81 MG chewable tablet Chew 81 mg by mouth daily.     atorvastatin (LIPITOR) 80 MG tablet Take 80 mg by mouth at bedtime.     guaiFENesin (ROBAFEN) 100 MG/5ML liquid Take 5 mLs by mouth every 4 (four) hours as needed for cough or to loosen phlegm.     lisinopril (ZESTRIL) 40 MG tablet Take 40 mg by mouth daily.     loperamide (IMODIUM) 2 MG capsule Take by mouth as needed for diarrhea or loose stools.     magnesium hydroxide (MILK OF MAGNESIA) 400 MG/5ML suspension Take 30 mLs by mouth daily as needed for mild constipation.  megestrol (MEGACE) 20 MG tablet Take 20 mg by mouth daily.     Neomycin-Bacitracin-Polymyxin (HCA TRIPLE ANTIBIOTIC OINTMENT EX) Apply topically.     thiamine (VITAMIN B-1) 100 MG tablet Take 100 mg by mouth daily.     traMADol (ULTRAM) 50 MG tablet Take by mouth every 6 (six) hours as needed.     No current facility-administered medications for this visit.    REVIEW OF SYSTEMS:   Pertinent information mentioned in HPI All other systems were reviewed with the patient and are negative.  PHYSICAL EXAMINATION: ECOG PERFORMANCE STATUS: 3 - Symptomatic, >50% confined to bed  There were no vitals filed for this visit. There were no vitals filed for this visit.  GENERAL:alert, no distress and comfortable SKIN: skin color, texture, turgor are normal, no rashes or significant  lesions EYES: normal, conjunctiva are pink and non-injected, sclera clear OROPHARYNX:no exudate, no erythema and lips, buccal mucosa, and tongue normal  NECK: supple, thyroid normal size, non-tender, without nodularity LYMPH:  no palpable lymphadenopathy in the cervical, axillary or inguinal LUNGS: clear to auscultation and percussion with normal breathing effort HEART: regular rate & rhythm and no murmurs and no lower extremity edema ABDOMEN:abdomen soft, non-tender and normal bowel sounds Musculoskeletal:no cyanosis of digits and no clubbing  PSYCH: alert & oriented x 2 to place and self NEURO: RLE weakness 3/5. Sensation intact  LABORATORY DATA:  I have reviewed the data as listed Lab Results  Component Value Date   WBC 7.5 11/01/2021   HGB 11.7 (L) 11/01/2021   HCT 36.5 (L) 11/01/2021   MCV 96.1 11/01/2021   PLT 237 11/01/2021   Recent Labs    06/22/21 1111 10/02/21 1132 11/01/21 1034  NA 141  --  139  K 3.9  --  3.8  CL 109  --  107  CO2 24  --  24  GLUCOSE 147*  --  92  BUN 26*  --  22  CREATININE 1.39* 1.20 1.08  CALCIUM 8.9  --  8.7*  GFRNONAA 53*  --  >60  PROT  --   --  6.1*  ALBUMIN  --   --  3.6  AST  --   --  44*  ALT  --   --  26  ALKPHOS  --   --  459*  BILITOT  --   --  1.1    RADIOGRAPHIC STUDIES: I have personally reviewed the radiological images as listed and agreed with the findings in the report. No results found.

## 2021-11-02 LAB — TESTOSTERONE: Testosterone: 134 ng/dL — ABNORMAL LOW (ref 264–916)

## 2021-11-06 ENCOUNTER — Telehealth: Payer: Self-pay | Admitting: Internal Medicine

## 2021-11-06 NOTE — Telephone Encounter (Signed)
I spoke with Ivan Anchors, Director at [facility] for [employer] who is in charge of making decisions and providing consent for the treatment.   I discussed about stage IV prostate cancer, treatment options and side effects. Refer to my initial H/P for details. I will start with Firmagon 240 mg loading dose followed by 80 mg monthly. Verbal consent by provided by Ivan Anchors.   Plan is ordered. We will have patient come in on Friday for first injection. Follow up in 5 weeks for MD visit, labs and injection. Information will be conveyed to [place] (caregiver) at group home.

## 2021-11-09 ENCOUNTER — Inpatient Hospital Stay: Payer: Medicare Other

## 2021-11-09 ENCOUNTER — Ambulatory Visit: Payer: Medicare Other

## 2021-11-10 ENCOUNTER — Ambulatory Visit: Payer: Medicare Other

## 2021-11-16 ENCOUNTER — Ambulatory Visit: Admission: RE | Admit: 2021-11-16 | Payer: Medicare Other | Source: Ambulatory Visit

## 2021-11-16 ENCOUNTER — Other Ambulatory Visit: Payer: Medicare Other

## 2021-12-11 ENCOUNTER — Inpatient Hospital Stay (HOSPITAL_BASED_OUTPATIENT_CLINIC_OR_DEPARTMENT_OTHER): Payer: Medicare Other | Admitting: Internal Medicine

## 2021-12-11 ENCOUNTER — Encounter: Payer: Self-pay | Admitting: Internal Medicine

## 2021-12-11 ENCOUNTER — Inpatient Hospital Stay: Payer: Medicare Other

## 2021-12-11 ENCOUNTER — Inpatient Hospital Stay: Payer: Medicare Other | Attending: Internal Medicine

## 2021-12-11 VITALS — BP 123/88 | HR 74 | Temp 96.6°F | Resp 20 | Wt 105.7 lb

## 2021-12-11 DIAGNOSIS — I10 Essential (primary) hypertension: Secondary | ICD-10-CM | POA: Diagnosis not present

## 2021-12-11 DIAGNOSIS — Z5111 Encounter for antineoplastic chemotherapy: Secondary | ICD-10-CM | POA: Insufficient documentation

## 2021-12-11 DIAGNOSIS — C772 Secondary and unspecified malignant neoplasm of intra-abdominal lymph nodes: Secondary | ICD-10-CM | POA: Diagnosis not present

## 2021-12-11 DIAGNOSIS — E785 Hyperlipidemia, unspecified: Secondary | ICD-10-CM

## 2021-12-11 DIAGNOSIS — R29898 Other symptoms and signs involving the musculoskeletal system: Secondary | ICD-10-CM | POA: Diagnosis not present

## 2021-12-11 DIAGNOSIS — C61 Malignant neoplasm of prostate: Secondary | ICD-10-CM

## 2021-12-11 DIAGNOSIS — C787 Secondary malignant neoplasm of liver and intrahepatic bile duct: Secondary | ICD-10-CM | POA: Insufficient documentation

## 2021-12-11 DIAGNOSIS — C7951 Secondary malignant neoplasm of bone: Secondary | ICD-10-CM | POA: Insufficient documentation

## 2021-12-11 LAB — CBC WITH DIFFERENTIAL/PLATELET
Abs Immature Granulocytes: 0.02 10*3/uL (ref 0.00–0.07)
Basophils Absolute: 0.1 10*3/uL (ref 0.0–0.1)
Basophils Relative: 1 %
Eosinophils Absolute: 0.2 10*3/uL (ref 0.0–0.5)
Eosinophils Relative: 3 %
HCT: 34.5 % — ABNORMAL LOW (ref 39.0–52.0)
Hemoglobin: 11.4 g/dL — ABNORMAL LOW (ref 13.0–17.0)
Immature Granulocytes: 0 %
Lymphocytes Relative: 30 %
Lymphs Abs: 2 10*3/uL (ref 0.7–4.0)
MCH: 30.3 pg (ref 26.0–34.0)
MCHC: 33 g/dL (ref 30.0–36.0)
MCV: 91.8 fL (ref 80.0–100.0)
Monocytes Absolute: 0.5 10*3/uL (ref 0.1–1.0)
Monocytes Relative: 8 %
Neutro Abs: 3.8 10*3/uL (ref 1.7–7.7)
Neutrophils Relative %: 58 %
Platelets: 269 10*3/uL (ref 150–400)
RBC: 3.76 MIL/uL — ABNORMAL LOW (ref 4.22–5.81)
RDW: 13.7 % (ref 11.5–15.5)
WBC: 6.6 10*3/uL (ref 4.0–10.5)
nRBC: 0 % (ref 0.0–0.2)

## 2021-12-11 LAB — COMPREHENSIVE METABOLIC PANEL
ALT: 20 U/L (ref 0–44)
AST: 41 U/L (ref 15–41)
Albumin: 3.2 g/dL — ABNORMAL LOW (ref 3.5–5.0)
Alkaline Phosphatase: 513 U/L — ABNORMAL HIGH (ref 38–126)
Anion gap: 4 — ABNORMAL LOW (ref 5–15)
BUN: 21 mg/dL (ref 8–23)
CO2: 25 mmol/L (ref 22–32)
Calcium: 8.5 mg/dL — ABNORMAL LOW (ref 8.9–10.3)
Chloride: 107 mmol/L (ref 98–111)
Creatinine, Ser: 1.11 mg/dL (ref 0.61–1.24)
GFR, Estimated: >60 mL/min (ref >60)
Glucose, Bld: 88 mg/dL (ref 70–99)
Potassium: 4.1 mmol/L (ref 3.5–5.1)
Sodium: 136 mmol/L (ref 135–145)
Total Bilirubin: 0.7 mg/dL (ref 0.3–1.2)
Total Protein: 5.7 g/dL — ABNORMAL LOW (ref 6.5–8.1)

## 2021-12-11 LAB — PSA: Prostatic Specific Antigen: 245 ng/mL — ABNORMAL HIGH (ref 0.00–4.00)

## 2021-12-11 MED ORDER — DEGARELIX ACETATE(240 MG DOSE) 120 MG/VIAL ~~LOC~~ SOLR
240.0000 mg | Freq: Once | SUBCUTANEOUS | Status: AC
  Administered 2021-12-11: 240 mg via SUBCUTANEOUS
  Filled 2021-12-11: qty 6

## 2021-12-11 NOTE — Progress Notes (Signed)
Adam Good NOTE  Patient Care Team: Terrill Mohr, NP as PCP - General (Nurse Practitioner)  REFERRING PROVIDER: Dr. 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   #Prostate cancer metastatic to multiple sites [facility]) -High-volume disease with metastasis to liver, diffuse bony mets and retroperitoneal lymph nodes. -Status post prostate biopsy on 06/27/2021 which showed 3+3 adenocarcinoma.  Likely not a true representation of his disease. -PSA elevated to 194 in March 2023.  PSA from 11/01/2021 151 - Considering he has high tumor burden and bony spine mets, he will benefit from McBride with loading dose of 240 mg once followed by 120 mg monthly to avoid flare phenomenon.  Has been noncompliance with the treatment.  Patient did not show up for multiple appointment scheduled for injection.  We will proceed today with loading dose of Firmagon 240 mg today.  He is not a candidate for chemotherapy due to dementia, poor functional status and multiple comorbidities.  May consider adding second-generation AR down the line if he tolerates [place] and is compliant with his treatment.   -CT chest with contrast was ordered to complete the staging process.  Patient did not show up to the appointment and the order was canceled.  We will reschedule it.  Essential hypertension - can worsen with ADT treatment. Will closely monitor - continue with lisinopril 40 mg daily   Dyslipidemia - can worsen on ADT. Will check lipid panel once ADT is started - continue with lipitor 80 mg daily   Weakness of right lower extremity - unclear baseline. Patient and caregiver could  not provide information.  -he does have metastatic prostate cancer.  -Preauth was not done.  We will reschedule MRI LS spine with and without contrast to rule out metastatic involvement.    Back pain - likely secondary to metastatic disease - continue with tramadol 50 mg q6 prn (prescribed by provider at group home)  RTC in 4 weeks for md visit, labs, firmagon   Orders Placed This Encounter  Procedures   CBC with Differential    Standing Status:   Future    Number of Occurrences:   1    Standing Expiration Date:   12/11/2022   PSA    Standing Status:   Future    Number of Occurrences:   1    Standing Expiration Date:   12/12/2022   Comprehensive metabolic panel    Standing Status:   Future    Number of Occurrences:   1    Standing Expiration Date:   12/11/2022   CBC with Differential/Platelet    Standing Status:   Future    Standing Expiration Date:   12/11/2022   Comprehensive metabolic panel    Standing Status:   Future    Standing Expiration Date:   12/11/2022   PSA    Standing Status:   Future    Standing Expiration Date:   12/12/2022    RTC - TBD after discussing with guardian  The total time spent in the appointment was 25 minutes encounter with patients including review of chart and various tests results, discussions about plan of care and coordination of care plan   All questions were  answered. The patient knows to call the clinic with any problems, questions or concerns. No barriers to learning was detected.  [name], MD 9/25/20232:07 PM   HISTORY OF PRESENTING ILLNESS:  Adam EKHOLM 76 y.o. male is here because of new diagnosis of metastatic prostate cancer.  Patient was seen today with care provider from [facility].  Patient has dementia and is hard of hearing.  He was unable to answer my questions appropriately.  He was not aware about the reason for his visit today and that he has a diagnosis of cancer.  He did mention he has pain in  his mid and lower back which is worse in the morning.  He is on tramadol.  Per caregiver, he uses cane to ambulate.  Denies any urinary or bowel incontinence.  I have reviewed his chart and materials related to his cancer extensively and collaborated history with the patient. Summary of oncologic history is as follows: Oncology History Overview Note  Metastatic prostate cancer to liver, bones and retroperitoneal lymph nodes   Prostate cancer metastatic to multiple sites [facility])  06/08/2021 Initial Diagnosis   Initial visit with Dr. Bernardo Heater of Urology for elevated PSA of 194.74   06/27/2021 Procedure   TRUS/transrectal prostate biopsies.    06/27/2021 Pathology Results   Diagnostic Summary   [A] PROSTATE, LEFT BASE:   ACINAR ADENOCARCINOMA, GLEASON 3+3=6  (GG  1), INVOLVING 1 OF 1 CORES, MEASURING 4  MM ( 20%).   [B] PROSTATE, LEFT MID:   ACINAR ADENOCARCINOMA, GLEASON 3+3=6  (GG 1),  INVOLVING 1 OF 2 CORES, MEASURING 4  MM ( 25%).   [C] PROSTATE, LEFT APEX:   ACINAR ADENOCARCINOMA, GLEASON 3+3=6  (GG  1), INVOLVING 2 OF 4 CORES, MEASURING 8  MM ( 42%).   [D] PROSTATE, RIGHT BASE:   NEGATIVE FOR MALIGNANCY.   [E] PROSTATE, RIGHT MID:   ACINAR ADENOCARCINOMA, GLEASON 3+3=6  (GG  1), INVOLVING 1 OF 3 CORES, MEASURING 8  MM ( 73%).   [F] PROSTATE, RIGHT APEX:   ACINAR ADENOCARCINOMA, GLEASON 3+3=6  (GG  1), INVOLVING 2 OF 2 CORES, MEASURING 22  MM ( 92%).   10/02/2021 Imaging   CT abdomen/pelvis  IMPRESSION: 1. Diffuse, heterogeneously lytic and sclerotic osseous metastatic disease involving the included axial skeleton. 2. Numerous hypodense metastatic liver lesions. 3. Numerous enlarged metastatic retroperitoneal lymph nodes. 4. Moderate hiatal hernia with intrathoracic position of the gastric fundus. 5. Mild prostatomegaly. 6. Aortic valve calcifications. Correlate for echocardiographic evidence of aortic valve dysfunction.  Bone scan - There are multiple foci of intense  radiopharmaceutical uptake in the bones, highly suspicious for metastatic disease. Prominent uptake is noted throughout the thoracolumbar spine, scattered throughout the pelvis, especially near the left sacroiliac joint, and in multiple ribs (greatest in approximately the posterior aspect of the right 7th rib).     10/26/2021 Cancer Staging   Staging form: Prostate, AJCC 8th Edition - Clinical: Stage IVB (cM1, Grade Group: 1) - Signed by [name], MD on 10/26/2021 Histologic grading system: 5 grade system     MEDICAL HISTORY:  Past Medical History:  Diagnosis Date   Alcohol abuse    Alzheimer disease ([place])    Aortic stenosis 06/04/2017   a.) TTE 06/04/2017: mild; MPG 20 mmHg. b.) TTE 08/14/209: mild-mod: MPG 22 mmHg. c.) TTE 03/26/2018: mod: MPG 30 mmHg. d.) TTE 06/15/2021: severe; MPG 44.5 mmHG   Bipolar disorder (HCC)    Cardiac murmur  COPD (chronic obstructive pulmonary disease) ([place])    Demand ischemia of myocardium ([place]) 01/2014   a.) positive troponins in the setting of a fall during intoxication; b.) Myoview 2/16: EF 65-70%, no evi of ischemia or infarct. No RWMA.; c.) MV 9/16: mild ischemia in the mid anterolateral, mid anterior, basal anterolateral and basal anterior segments, patient declined LHC   Dementia ([place])    Diastolic dysfunction    a.) TTE 9/16: EF of 50-55%, G2DD, severely dilated LA, and moderate AS; b.) TTE 3/19: EF of 50-55%, G1DD, mildly dilated LA, mildly calcified MV leaflets, mild AS, mild TR, trace PR, RVSP 46 mmHg, trivial pericardial effusion. c.) TTE 03/26/2018: EF 60-65%; mild LA dilation, mod AS (MPG 30 mmHG); G2DD. d.) TTE 06/08/2021: EF 60-65%; severe AS (MPG 44.5 mmHg); G1DD.   Dyslipidemia    Essential hypertension    Gastroesophageal reflux disease    HLD (hyperlipidemia)    HOH (hard of hearing)    NSTEMI (non-ST elevated myocardial infarction) ([place]) 11/17/2014   a.) TTE with EF 50-55%; G3DD; severe dilated LA; mod AS; stress testing  revealed mild ischemia in the mid anterolateral, mid anterior, basal and lateral, and basal anterior segments; LHC recommended, however patient declined opting for medical mgmt.   Prostate cancer ([place])    Sleep apnea    Stroke ([place])    Vitamin D deficiency disease     SURGICAL HISTORY: Past Surgical History:  Procedure Laterality Date   HERNIA REPAIR     KNEE SURGERY     PROSTATE BIOPSY N/A 06/27/2021   Procedure: PROSTATE BIOPSY;  Surgeon: Abbie Sons, MD;  Location: [facility] ORS;  Service: Urology;  Laterality: N/A;   TRANSRECTAL ULTRASOUND N/A 06/27/2021   Procedure: TRANSRECTAL ULTRASOUND;  Surgeon: Abbie Sons, MD;  Location: [facility] ORS;  Service: Urology;  Laterality: N/A;    SOCIAL HISTORY: Social History   Socioeconomic History   Marital status: Single    Spouse name: Not on file   Number of children: Not on file   Years of education: Not on file   Highest education level: Not on file  Occupational History   Not on file  Tobacco Use   Smoking status: Former    Packs/day: 0.50    Years: 57.00    Total pack years: 28.50    Types: Cigarettes    Quit date: 02/2021    Years since quitting: 0.8   Smokeless tobacco: Current    Types: Snuff  Vaping Use   Vaping Use: Former  Substance and Sexual Activity   Alcohol use: Not Currently    Comment: He has been a heavy alcohol drinker for > 30 yrs.  Stats that he quit 3 weeks ago.    Drug use: No   Sexual activity: Not Currently  Other Topics Concern   Not on file  Social History Narrative   Not on file   Social Determinants of Health   Financial Resource Strain: Not on file  Food Insecurity: Not on file  Transportation Needs: Not on file  Physical Activity: Not on file  Stress: Not on file  Social Connections: Not on file  Intimate Partner Violence: Not on file    FAMILY HISTORY: Family History  Problem Relation Age of Onset   Throat cancer Mother    Cancer Father    CAD Sister    Hypertension Sister     Diabetes Brother     ALLERGIES:  has No Known Allergies.  MEDICATIONS:  Current Outpatient  Medications  Medication Sig Dispense Refill   acetaminophen (TYLENOL) 500 MG tablet Take 500 mg by mouth every 6 (six) hours as needed.     ADVAIR DISKUS 250-50 MCG/ACT AEPB Inhale 1 puff into the lungs 2 (two) times daily.     alum & mag hydroxide-simeth (MAALOX/MYLANTA) 200-200-20 MG/5ML suspension Take by mouth every 6 (six) hours as needed for indigestion or heartburn.     aspirin 81 MG chewable tablet Chew 81 mg by mouth daily.     atorvastatin (LIPITOR) 80 MG tablet Take 80 mg by mouth at bedtime.     guaiFENesin (ROBAFEN) 100 MG/5ML liquid Take 5 mLs by mouth every 4 (four) hours as needed for cough or to loosen phlegm.     lisinopril (ZESTRIL) 40 MG tablet Take 40 mg by mouth daily.     loperamide (IMODIUM) 2 MG capsule Take by mouth as needed for diarrhea or loose stools.     magnesium hydroxide (MILK OF MAGNESIA) 400 MG/5ML suspension Take 30 mLs by mouth daily as needed for mild constipation.     megestrol (MEGACE) 20 MG tablet Take 20 mg by mouth daily.     Neomycin-Bacitracin-Polymyxin (HCA TRIPLE ANTIBIOTIC OINTMENT EX) Apply topically.     thiamine (VITAMIN B-1) 100 MG tablet Take 100 mg by mouth daily.     traMADol (ULTRAM) 50 MG tablet Take by mouth every 6 (six) hours as needed.     No current facility-administered medications for this visit.    REVIEW OF SYSTEMS:   Pertinent information mentioned in HPI All other systems were reviewed with the patient and are negative.  PHYSICAL EXAMINATION: ECOG PERFORMANCE STATUS: 3 - Symptomatic, >50% confined to bed  Vitals:   12/11/21 1312  BP: 123/88  Pulse: 74  Resp: 20  Temp: (!) 96.6 F (35.9 C)  SpO2: 100%   Filed Weights   12/11/21 1312  Weight: 105 lb 11.2 oz (47.9 kg)    GENERAL:alert, no distress and comfortable SKIN: skin color, texture, turgor are normal, no rashes or significant lesions EYES: normal,  conjunctiva are pink and non-injected, sclera clear OROPHARYNX:no exudate, no erythema and lips, buccal mucosa, and tongue normal  NECK: supple, thyroid normal size, non-tender, without nodularity LYMPH:  no palpable lymphadenopathy in the cervical, axillary or inguinal LUNGS: clear to auscultation and percussion with normal breathing effort HEART: regular rate & rhythm and no murmurs and no lower extremity edema ABDOMEN:abdomen soft, non-tender and normal bowel sounds Musculoskeletal:no cyanosis of digits and no clubbing  PSYCH: alert & oriented x 2 to place and self NEURO: RLE weakness 3/5. Sensation intact  LABORATORY DATA:  I have reviewed the data as listed Lab Results  Component Value Date   WBC 6.6 12/11/2021   HGB 11.4 (L) 12/11/2021   HCT 34.5 (L) 12/11/2021   MCV 91.8 12/11/2021   PLT 269 12/11/2021   Recent Labs    06/22/21 1111 10/02/21 1132 11/01/21 1034 12/11/21 1256  NA 141  --  139 136  K 3.9  --  3.8 4.1  CL 109  --  107 107  CO2 24  --  24 25  GLUCOSE 147*  --  92 88  BUN 26*  --  22 21  CREATININE 1.39* 1.20 1.08 1.11  CALCIUM 8.9  --  8.7* 8.5*  GFRNONAA 53*  --  >60 >60  PROT  --   --  6.1* 5.7*  ALBUMIN  --   --  3.6 3.2*  AST  --   --  44* 41  ALT  --   --  26 20  ALKPHOS  --   --  459* 513*  BILITOT  --   --  1.1 0.7    RADIOGRAPHIC STUDIES: I have personally reviewed the radiological images as listed and agreed with the findings in the report. No results found.

## 2021-12-26 ENCOUNTER — Ambulatory Visit: Payer: Medicare Other

## 2021-12-26 ENCOUNTER — Ambulatory Visit: Admission: RE | Admit: 2021-12-26 | Payer: Medicare Other | Source: Ambulatory Visit

## 2022-01-08 ENCOUNTER — Inpatient Hospital Stay: Payer: Medicare Other

## 2022-01-08 ENCOUNTER — Inpatient Hospital Stay: Payer: Medicare Other | Admitting: Internal Medicine

## 2022-01-09 ENCOUNTER — Inpatient Hospital Stay: Payer: Medicare Other | Attending: Internal Medicine

## 2022-01-09 ENCOUNTER — Encounter: Payer: Self-pay | Admitting: Internal Medicine

## 2022-01-09 ENCOUNTER — Inpatient Hospital Stay: Payer: Medicare Other

## 2022-01-09 ENCOUNTER — Inpatient Hospital Stay (HOSPITAL_BASED_OUTPATIENT_CLINIC_OR_DEPARTMENT_OTHER): Payer: Medicare Other | Admitting: Internal Medicine

## 2022-01-09 VITALS — BP 123/86 | HR 92 | Temp 96.6°F | Resp 20 | Wt 102.6 lb

## 2022-01-09 DIAGNOSIS — I10 Essential (primary) hypertension: Secondary | ICD-10-CM | POA: Diagnosis not present

## 2022-01-09 DIAGNOSIS — C61 Malignant neoplasm of prostate: Secondary | ICD-10-CM | POA: Insufficient documentation

## 2022-01-09 DIAGNOSIS — E785 Hyperlipidemia, unspecified: Secondary | ICD-10-CM | POA: Diagnosis not present

## 2022-01-09 DIAGNOSIS — Z79818 Long term (current) use of other agents affecting estrogen receptors and estrogen levels: Secondary | ICD-10-CM

## 2022-01-09 DIAGNOSIS — C772 Secondary and unspecified malignant neoplasm of intra-abdominal lymph nodes: Secondary | ICD-10-CM | POA: Insufficient documentation

## 2022-01-09 DIAGNOSIS — Z5111 Encounter for antineoplastic chemotherapy: Secondary | ICD-10-CM | POA: Diagnosis present

## 2022-01-09 DIAGNOSIS — C7951 Secondary malignant neoplasm of bone: Secondary | ICD-10-CM | POA: Diagnosis not present

## 2022-01-09 DIAGNOSIS — C787 Secondary malignant neoplasm of liver and intrahepatic bile duct: Secondary | ICD-10-CM | POA: Insufficient documentation

## 2022-01-09 LAB — CBC WITH DIFFERENTIAL/PLATELET
Abs Immature Granulocytes: 0.02 10*3/uL (ref 0.00–0.07)
Basophils Absolute: 0.1 10*3/uL (ref 0.0–0.1)
Basophils Relative: 1 %
Eosinophils Absolute: 0.1 10*3/uL (ref 0.0–0.5)
Eosinophils Relative: 2 %
HCT: 39.1 % (ref 39.0–52.0)
Hemoglobin: 12.8 g/dL — ABNORMAL LOW (ref 13.0–17.0)
Immature Granulocytes: 0 %
Lymphocytes Relative: 28 %
Lymphs Abs: 2.2 10*3/uL (ref 0.7–4.0)
MCH: 30.3 pg (ref 26.0–34.0)
MCHC: 32.7 g/dL (ref 30.0–36.0)
MCV: 92.7 fL (ref 80.0–100.0)
Monocytes Absolute: 0.7 10*3/uL (ref 0.1–1.0)
Monocytes Relative: 9 %
Neutro Abs: 4.8 10*3/uL (ref 1.7–7.7)
Neutrophils Relative %: 60 %
Platelets: 231 10*3/uL (ref 150–400)
RBC: 4.22 MIL/uL (ref 4.22–5.81)
RDW: 15.7 % — ABNORMAL HIGH (ref 11.5–15.5)
WBC: 7.9 10*3/uL (ref 4.0–10.5)
nRBC: 0 % (ref 0.0–0.2)

## 2022-01-09 LAB — COMPREHENSIVE METABOLIC PANEL
ALT: 18 U/L (ref 0–44)
AST: 44 U/L — ABNORMAL HIGH (ref 15–41)
Albumin: 3.6 g/dL (ref 3.5–5.0)
Alkaline Phosphatase: 603 U/L — ABNORMAL HIGH (ref 38–126)
Anion gap: 7 (ref 5–15)
BUN: 19 mg/dL (ref 8–23)
CO2: 23 mmol/L (ref 22–32)
Calcium: 8.8 mg/dL — ABNORMAL LOW (ref 8.9–10.3)
Chloride: 107 mmol/L (ref 98–111)
Creatinine, Ser: 1.06 mg/dL (ref 0.61–1.24)
GFR, Estimated: >60 mL/min (ref >60)
Glucose, Bld: 79 mg/dL (ref 70–99)
Potassium: 3.7 mmol/L (ref 3.5–5.1)
Sodium: 137 mmol/L (ref 135–145)
Total Bilirubin: 0.8 mg/dL (ref 0.3–1.2)
Total Protein: 6.3 g/dL — ABNORMAL LOW (ref 6.5–8.1)

## 2022-01-09 LAB — PSA: Prostatic Specific Antigen: 191 ng/mL — ABNORMAL HIGH (ref 0.00–4.00)

## 2022-01-09 MED ORDER — DEGARELIX ACETATE 80 MG ~~LOC~~ SOLR
80.0000 mg | Freq: Once | SUBCUTANEOUS | Status: AC
  Administered 2022-01-09: 80 mg via SUBCUTANEOUS
  Filled 2022-01-09: qty 4

## 2022-01-09 NOTE — Progress Notes (Signed)
[facility] NOTE  Patient Care Team: Terrill Mohr, NP as PCP - General (Nurse Practitioner)  REFERRING PROVIDER: Dr. 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   #Prostate cancer metastatic to multiple sites [facility]), Stage IV -High-volume disease with metastasis to liver, diffuse bony mets and retroperitoneal lymph nodes. -Status post prostate biopsy on 06/27/2021 which showed 3+3 adenocarcinoma.  Likely not a true representation of his disease. -PSA elevated to 194 in March 2023.  PSA from 12/12/21 prior to starting treatment 245  -Patient had multiple no-shows for starting Firmagon.  Eventually, he was started on 12/12/2021 with firmagon loading dose of 240 mg.  He tolerated it well.  Labs reviewed.  We will proceed with maintenance dose of 80 mg Firmagon today.  PSA pending from today.  - He is not a candidate for chemotherapy due to dementia, poor functional status and multiple comorbidities.  May consider adding second-generation AR down the line if he tolerates [place] and is compliant with his treatment.   -I do not have CT chest for complete staging.  Patient was no-show multiple times to CT chest appointment.  He is now already started on the treatment.  I will hold off on further scheduling.  Monitor PSA.  Essential hypertension - can worsen with ADT treatment. Will closely monitor - continue with lisinopril 40 mg daily   Dyslipidemia - continue with lipitor 80 mg daily   Weakness of right lower extremity -Per caregiver, this has been chronic. -I ordered MRI L-spine to rule out any metastatic process.  Patient was no-show for the  appointments. His weakness is stable.    Back pain - likely secondary to metastatic disease - continue with tramadol 50 mg q6 prn (prescribed by provider at group home)  RTC in 4 weeks for md visit, labs, firmagon   Orders Placed This Encounter  Procedures   CBC with Differential/Platelet    Standing Status:   Future    Standing Expiration Date:   01/10/2023   Comprehensive metabolic panel    Standing Status:   Future    Standing Expiration Date:   01/10/2023   PSA    Standing Status:   Future    Standing Expiration Date:   01/10/2023   The total time spent in the appointment was 30 minutes encounter with patients including review of chart and various tests results, discussions about plan of care and coordination of care plan   All questions were answered. The patient knows to call the clinic with any problems, questions or concerns. No barriers to learning was detected.  [name], MD 10/24/202311:58 AM   HISTORY OF PRESENTING ILLNESS:  Adam Good 76 y.o. male is here because of new diagnosis of metastatic prostate cancer.   INTERVAL HISTORY-  Patient was seen today with care provider from [facility].  Patient has dementia and is hard of hearing.  He was unable to answer my questions appropriately.  Per caregiver, patient tolerated Firmagon injection well.  Denies any hot flashes.  Denies any shortness of breath, chest pain, fever, chills.  He continues to have right lower extremity weakness which per the caregiver has been chronic.  I have reviewed his chart and materials related to his cancer extensively and collaborated history with the patient. Summary of oncologic history is as follows: Oncology History Overview Note  Metastatic prostate cancer to liver, bones and retroperitoneal lymph nodes   Prostate cancer metastatic to multiple sites [facility])  06/08/2021 Initial Diagnosis   Initial visit with Dr. Bernardo Heater of Urology for elevated PSA of  194.74   06/27/2021 Procedure   TRUS/transrectal prostate biopsies.    06/27/2021 Pathology Results   Diagnostic Summary   [A] PROSTATE, LEFT BASE:   ACINAR ADENOCARCINOMA, GLEASON 3+3=6  (GG  1), INVOLVING 1 OF 1 CORES, MEASURING 4  MM ( 20%).   [B] PROSTATE, LEFT MID:   ACINAR ADENOCARCINOMA, GLEASON 3+3=6  (GG 1),  INVOLVING 1 OF 2 CORES, MEASURING 4  MM ( 25%).   [C] PROSTATE, LEFT APEX:   ACINAR ADENOCARCINOMA, GLEASON 3+3=6  (GG  1), INVOLVING 2 OF 4 CORES, MEASURING 8  MM ( 42%).   [D] PROSTATE, RIGHT BASE:   NEGATIVE FOR MALIGNANCY.   [E] PROSTATE, RIGHT MID:   ACINAR ADENOCARCINOMA, GLEASON 3+3=6  (GG  1), INVOLVING 1 OF 3 CORES, MEASURING 8  MM ( 73%).   [F] PROSTATE, RIGHT APEX:   ACINAR ADENOCARCINOMA, GLEASON 3+3=6  (GG  1), INVOLVING 2 OF 2 CORES, MEASURING 22  MM ( 92%).   10/02/2021 Imaging   CT abdomen/pelvis  IMPRESSION: 1. Diffuse, heterogeneously lytic and sclerotic osseous metastatic disease involving the included axial skeleton. 2. Numerous hypodense metastatic liver lesions. 3. Numerous enlarged metastatic retroperitoneal lymph nodes. 4. Moderate hiatal hernia with intrathoracic position of the gastric fundus. 5. Mild prostatomegaly. 6. Aortic valve calcifications. Correlate for echocardiographic evidence of aortic valve dysfunction.  Bone scan - There are multiple foci of intense radiopharmaceutical uptake in the bones, highly suspicious for metastatic disease. Prominent uptake is noted throughout the thoracolumbar spine, scattered throughout the pelvis, especially near the left sacroiliac joint, and in multiple ribs (greatest in approximately the posterior aspect of the right 7th rib).     10/26/2021 Cancer Staging   Staging form: Prostate, AJCC 8th Edition - Clinical: Stage IVB (cM1, Grade Group: 1) - Signed by [name], MD on 10/26/2021 Histologic grading system: 5 grade system     MEDICAL HISTORY:  Past Medical History:  Diagnosis Date    Alcohol abuse    Alzheimer disease ([place])    Aortic stenosis 06/04/2017   a.) TTE 06/04/2017: mild; MPG 20 mmHg. b.) TTE 08/14/209: mild-mod: MPG 22 mmHg. c.) TTE 03/26/2018: mod: MPG 30 mmHg. d.) TTE 06/15/2021: severe; MPG 44.5 mmHG   Bipolar disorder (HCC)    Cardiac murmur    COPD (chronic obstructive pulmonary disease) ([place])    Demand ischemia of myocardium 01/2014   a.) positive troponins in the setting of a fall during intoxication; b.) Myoview 2/16: EF 65-70%, no evi of ischemia or infarct. No RWMA.; c.) MV 9/16: mild ischemia in the mid anterolateral, mid anterior, basal anterolateral and basal anterior segments, patient declined LHC   Dementia ([place])    Diastolic dysfunction    a.) TTE 9/16: EF of 50-55%, G2DD, severely dilated LA, and moderate AS; b.) TTE 3/19: EF of 50-55%, G1DD, mildly dilated LA, mildly calcified MV leaflets, mild AS, mild TR, trace PR, RVSP 46 mmHg, trivial pericardial effusion. c.) TTE 03/26/2018: EF 60-65%; mild LA dilation, mod AS (MPG 30 mmHG); G2DD. d.) TTE 06/08/2021: EF 60-65%; severe AS (MPG 44.5 mmHg); G1DD.   Dyslipidemia  Essential hypertension    Gastroesophageal reflux disease    HLD (hyperlipidemia)    HOH (hard of hearing)    NSTEMI (non-ST elevated myocardial infarction) ([place]) 11/17/2014   a.) TTE with EF 50-55%; G3DD; severe dilated LA; mod AS; stress testing revealed mild ischemia in the mid anterolateral, mid anterior, basal and lateral, and basal anterior segments; LHC recommended, however patient declined opting for medical mgmt.   Prostate cancer ([place])    Sleep apnea    Stroke ([place])    Vitamin D deficiency disease     SURGICAL HISTORY: Past Surgical History:  Procedure Laterality Date   HERNIA REPAIR     KNEE SURGERY     PROSTATE BIOPSY N/A 06/27/2021   Procedure: PROSTATE BIOPSY;  Surgeon: Abbie Sons, MD;  Location: [facility] ORS;  Service: Urology;  Laterality: N/A;   TRANSRECTAL ULTRASOUND N/A 06/27/2021   Procedure: TRANSRECTAL  ULTRASOUND;  Surgeon: Abbie Sons, MD;  Location: [facility] ORS;  Service: Urology;  Laterality: N/A;    SOCIAL HISTORY: Social History   Socioeconomic History   Marital status: Single    Spouse name: Not on file   Number of children: Not on file   Years of education: Not on file   Highest education level: Not on file  Occupational History   Not on file  Tobacco Use   Smoking status: Former    Packs/day: 0.50    Years: 57.00    Total pack years: 28.50    Types: Cigarettes    Quit date: 02/2021    Years since quitting: 0.8   Smokeless tobacco: Current    Types: Snuff  Vaping Use   Vaping Use: Former  Substance and Sexual Activity   Alcohol use: Not Currently    Comment: He has been a heavy alcohol drinker for > 30 yrs.  Stats that he quit 3 weeks ago.    Drug use: No   Sexual activity: Not Currently  Other Topics Concern   Not on file  Social History Narrative   Not on file   Social Determinants of Health   Financial Resource Strain: Not on file  Food Insecurity: Not on file  Transportation Needs: Not on file  Physical Activity: Not on file  Stress: Not on file  Social Connections: Not on file  Intimate Partner Violence: Not on file    FAMILY HISTORY: Family History  Problem Relation Age of Onset   Throat cancer Mother    Cancer Father    CAD Sister    Hypertension Sister    Diabetes Brother     ALLERGIES:  has No Known Allergies.  MEDICATIONS:  Current Outpatient Medications  Medication Sig Dispense Refill   acetaminophen (TYLENOL) 500 MG tablet Take 500 mg by mouth every 6 (six) hours as needed.     ADVAIR DISKUS 250-50 MCG/ACT AEPB Inhale 1 puff into the lungs 2 (two) times daily.     alum & mag hydroxide-simeth (MAALOX/MYLANTA) 200-200-20 MG/5ML suspension Take by mouth every 6 (six) hours as needed for indigestion or heartburn.     aspirin 81 MG chewable tablet Chew 81 mg by mouth daily.     atorvastatin (LIPITOR) 80 MG tablet Take 80 mg by mouth  at bedtime.     guaiFENesin (ROBAFEN) 100 MG/5ML liquid Take 5 mLs by mouth every 4 (four) hours as needed for cough or to loosen phlegm.     lisinopril (ZESTRIL) 40 MG tablet Take 40 mg by mouth daily.  loperamide (IMODIUM) 2 MG capsule Take by mouth as needed for diarrhea or loose stools.     magnesium hydroxide (MILK OF MAGNESIA) 400 MG/5ML suspension Take 30 mLs by mouth daily as needed for mild constipation.     megestrol (MEGACE) 20 MG tablet Take 20 mg by mouth daily.     Neomycin-Bacitracin-Polymyxin (HCA TRIPLE ANTIBIOTIC OINTMENT EX) Apply topically.     thiamine (VITAMIN B-1) 100 MG tablet Take 100 mg by mouth daily.     traMADol (ULTRAM) 50 MG tablet Take by mouth every 6 (six) hours as needed.     No current facility-administered medications for this visit.    REVIEW OF SYSTEMS:   Pertinent information mentioned in HPI All other systems were reviewed with the patient and are negative.  PHYSICAL EXAMINATION: ECOG PERFORMANCE STATUS: 3 - Symptomatic, >50% confined to bed  Vitals:   01/09/22 1046  BP: 123/86  Pulse: 92  Resp: 20  Temp: (!) 96.6 F (35.9 C)  SpO2: 100%   Filed Weights   01/09/22 1046  Weight: 102 lb 9.6 oz (46.5 kg)    GENERAL:alert, no distress and comfortable SKIN: skin color, texture, turgor are normal, no rashes or significant lesions EYES: normal, conjunctiva are pink and non-injected, sclera clear OROPHARYNX:no exudate, no erythema and lips, buccal mucosa, and tongue normal  NECK: supple, thyroid normal size, non-tender, without nodularity LYMPH:  no palpable lymphadenopathy in the cervical, axillary or inguinal LUNGS: clear to auscultation and percussion with normal breathing effort HEART: regular rate & rhythm and no murmurs and no lower extremity edema ABDOMEN:abdomen soft, non-tender and normal bowel sounds Musculoskeletal:no cyanosis of digits and no clubbing  PSYCH: alert & oriented x 2 to place and self NEURO: RLE weakness 3/5.  Sensation intact  LABORATORY DATA:  I have reviewed the data as listed Lab Results  Component Value Date   WBC 7.9 01/09/2022   HGB 12.8 (L) 01/09/2022   HCT 39.1 01/09/2022   MCV 92.7 01/09/2022   PLT 231 01/09/2022   Recent Labs    11/01/21 1034 12/11/21 1256 01/09/22 1034  NA 139 136 137  K 3.8 4.1 3.7  CL 107 107 107  CO2 '24 25 23  '$ GLUCOSE 92 88 79  BUN '22 21 19  '$ CREATININE 1.08 1.11 1.06  CALCIUM 8.7* 8.5* 8.8*  GFRNONAA >60 >60 >60  PROT 6.1* 5.7* 6.3*  ALBUMIN 3.6 3.2* 3.6  AST 44* 41 44*  ALT '26 20 18  '$ ALKPHOS 459* 513* 603*  BILITOT 1.1 0.7 0.8    RADIOGRAPHIC STUDIES: I have personally reviewed the radiological images as listed and agreed with the findings in the report. No results found.

## 2022-02-06 ENCOUNTER — Inpatient Hospital Stay: Payer: Medicare Other

## 2022-02-06 ENCOUNTER — Inpatient Hospital Stay: Payer: Medicare Other | Admitting: Internal Medicine

## 2022-02-13 ENCOUNTER — Telehealth: Payer: Self-pay

## 2022-02-13 ENCOUNTER — Inpatient Hospital Stay: Payer: Medicare Other | Admitting: Pharmacist

## 2022-02-13 ENCOUNTER — Inpatient Hospital Stay: Payer: Medicare Other | Attending: Internal Medicine

## 2022-02-13 ENCOUNTER — Inpatient Hospital Stay (HOSPITAL_BASED_OUTPATIENT_CLINIC_OR_DEPARTMENT_OTHER): Payer: Medicare Other | Admitting: Internal Medicine

## 2022-02-13 ENCOUNTER — Encounter: Payer: Self-pay | Admitting: Internal Medicine

## 2022-02-13 ENCOUNTER — Other Ambulatory Visit (HOSPITAL_COMMUNITY): Payer: Self-pay

## 2022-02-13 ENCOUNTER — Inpatient Hospital Stay: Payer: Medicare Other

## 2022-02-13 VITALS — BP 123/87 | HR 83 | Temp 97.6°F | Resp 18 | Wt 105.7 lb

## 2022-02-13 DIAGNOSIS — C772 Secondary and unspecified malignant neoplasm of intra-abdominal lymph nodes: Secondary | ICD-10-CM | POA: Insufficient documentation

## 2022-02-13 DIAGNOSIS — Z79818 Long term (current) use of other agents affecting estrogen receptors and estrogen levels: Secondary | ICD-10-CM | POA: Diagnosis not present

## 2022-02-13 DIAGNOSIS — C7951 Secondary malignant neoplasm of bone: Secondary | ICD-10-CM | POA: Insufficient documentation

## 2022-02-13 DIAGNOSIS — C61 Malignant neoplasm of prostate: Secondary | ICD-10-CM | POA: Insufficient documentation

## 2022-02-13 DIAGNOSIS — Z5111 Encounter for antineoplastic chemotherapy: Secondary | ICD-10-CM | POA: Diagnosis present

## 2022-02-13 DIAGNOSIS — C787 Secondary malignant neoplasm of liver and intrahepatic bile duct: Secondary | ICD-10-CM | POA: Insufficient documentation

## 2022-02-13 LAB — CBC WITH DIFFERENTIAL/PLATELET
Abs Immature Granulocytes: 0.04 10*3/uL (ref 0.00–0.07)
Basophils Absolute: 0.1 10*3/uL (ref 0.0–0.1)
Basophils Relative: 1 %
Eosinophils Absolute: 0.2 10*3/uL (ref 0.0–0.5)
Eosinophils Relative: 3 %
HCT: 38.6 % — ABNORMAL LOW (ref 39.0–52.0)
Hemoglobin: 12.6 g/dL — ABNORMAL LOW (ref 13.0–17.0)
Immature Granulocytes: 1 %
Lymphocytes Relative: 25 %
Lymphs Abs: 1.9 10*3/uL (ref 0.7–4.0)
MCH: 31.7 pg (ref 26.0–34.0)
MCHC: 32.6 g/dL (ref 30.0–36.0)
MCV: 97 fL (ref 80.0–100.0)
Monocytes Absolute: 0.8 10*3/uL (ref 0.1–1.0)
Monocytes Relative: 10 %
Neutro Abs: 4.7 10*3/uL (ref 1.7–7.7)
Neutrophils Relative %: 60 %
Platelets: 192 10*3/uL (ref 150–400)
RBC: 3.98 MIL/uL — ABNORMAL LOW (ref 4.22–5.81)
RDW: 16 % — ABNORMAL HIGH (ref 11.5–15.5)
WBC: 7.8 10*3/uL (ref 4.0–10.5)
nRBC: 0 % (ref 0.0–0.2)

## 2022-02-13 LAB — COMPREHENSIVE METABOLIC PANEL
ALT: 25 U/L (ref 0–44)
AST: 69 U/L — ABNORMAL HIGH (ref 15–41)
Albumin: 3.6 g/dL (ref 3.5–5.0)
Alkaline Phosphatase: 525 U/L — ABNORMAL HIGH (ref 38–126)
Anion gap: 10 (ref 5–15)
BUN: 33 mg/dL — ABNORMAL HIGH (ref 8–23)
CO2: 21 mmol/L — ABNORMAL LOW (ref 22–32)
Calcium: 8.3 mg/dL — ABNORMAL LOW (ref 8.9–10.3)
Chloride: 106 mmol/L (ref 98–111)
Creatinine, Ser: 1.24 mg/dL (ref 0.61–1.24)
GFR, Estimated: >60 mL/min (ref >60)
Glucose, Bld: 74 mg/dL (ref 70–99)
Potassium: 3.6 mmol/L (ref 3.5–5.1)
Sodium: 137 mmol/L (ref 135–145)
Total Bilirubin: 0.7 mg/dL (ref 0.3–1.2)
Total Protein: 6 g/dL — ABNORMAL LOW (ref 6.5–8.1)

## 2022-02-13 LAB — PSA: Prostatic Specific Antigen: 257 ng/mL — ABNORMAL HIGH (ref 0.00–4.00)

## 2022-02-13 MED ORDER — LEUPROLIDE ACETATE (6 MONTH) 45 MG IM KIT: 45.0000 mg | PACK | Freq: Once | INTRAMUSCULAR | Status: DC

## 2022-02-13 MED ORDER — ENZALUTAMIDE 80 MG PO TABS: 160.0000 mg | ORAL_TABLET | Freq: Every day | ORAL | 0 refills | Status: DC

## 2022-02-13 MED ORDER — ENZALUTAMIDE 40 MG PO TABS
160.0000 mg | ORAL_TABLET | Freq: Every day | ORAL | 0 refills | Status: DC
  Filled 2022-02-13: qty 60, 15d supply, fill #0
  Filled 2022-02-13: qty 120, 30d supply, fill #0

## 2022-02-13 MED ORDER — LEUPROLIDE ACETATE (6 MONTH) 45 MG ~~LOC~~ KIT
45.0000 mg | PACK | Freq: Once | SUBCUTANEOUS | Status: AC
  Administered 2022-02-13: 45 mg via SUBCUTANEOUS
  Filled 2022-02-13: qty 45

## 2022-02-13 NOTE — Progress Notes (Signed)
Patient here today for follow up regarding prostate cancer. Patient denies concerns today.

## 2022-02-13 NOTE — Telephone Encounter (Signed)
Oral Oncology Patient Advocate Encounter   Received notification that prior authorization for [facility] [employer] is required.   PA submitted on 02/13/22  Key [id number]  Status is pending     Berdine Addison, [place] Patient [place]  [phone] (phone) [phone] (fax) 02/13/2022 12:13 PM

## 2022-02-13 NOTE — Telephone Encounter (Signed)
Oral Oncology Patient Advocate Encounter  Prior Authorization for [facility] [employer] has been approved.    PA# [id number]  Effective dates: 01.01.23 through 11.27.24  Patients co-pay is $4.30.    Berdine Addison, [facility] Patient [place]  [phone] (phone) [phone] (fax) 02/13/2022 12:51 PM

## 2022-02-13 NOTE — Progress Notes (Signed)
[facility]  [phone] [phone]  Patient Care Team: Terrill Mohr, NP as PCP - General (Nurse Practitioner)   Name of the patient: Adam Good  [id number]  Oct 13, 1945   Date of visit: 02/13/22  HPI: Patient is a 76 y.o. male with metastatic castration sensitive prostate cancer. Planned treatment with ADT (already started) and Xtandi (enzalutamide).   Reason for Consult: Xtandi (enzalutamide) oral chemotherapy education.   PAST MEDICAL HISTORY: Past Medical History:  Diagnosis Date   Alcohol abuse    Alzheimer disease ([place])    Aortic stenosis 06/04/2017   a.) TTE 06/04/2017: mild; MPG 20 mmHg. b.) TTE 08/14/209: mild-mod: MPG 22 mmHg. c.) TTE 03/26/2018: mod: MPG 30 mmHg. d.) TTE 06/15/2021: severe; MPG 44.5 mmHG   Bipolar disorder (HCC)    Cardiac murmur    COPD (chronic obstructive pulmonary disease) ([place])    Demand ischemia of myocardium 01/2014   a.) positive troponins in the setting of a fall during intoxication; b.) Myoview 2/16: EF 65-70%, no evi of ischemia or infarct. No RWMA.; c.) MV 9/16: mild ischemia in the mid anterolateral, mid anterior, basal anterolateral and basal anterior segments, patient declined LHC   Dementia ([place])    Diastolic dysfunction    a.) TTE 9/16: EF of 50-55%, G2DD, severely dilated LA, and moderate AS; b.) TTE 3/19: EF of 50-55%, G1DD, mildly dilated LA, mildly calcified MV leaflets, mild AS, mild TR, trace PR, RVSP 46 mmHg, trivial pericardial effusion. c.) TTE 03/26/2018: EF 60-65%; mild LA dilation, mod AS (MPG 30 mmHG); G2DD. d.) TTE 06/08/2021: EF 60-65%; severe AS (MPG 44.5 mmHg); G1DD.   Dyslipidemia    Essential hypertension    Gastroesophageal reflux disease    HLD (hyperlipidemia)    HOH (hard of hearing)    NSTEMI (non-ST elevated myocardial infarction) ([place]) 11/17/2014   a.) TTE with EF 50-55%; G3DD; severe dilated LA; mod AS; stress testing revealed mild ischemia  in the mid anterolateral, mid anterior, basal and lateral, and basal anterior segments; LHC recommended, however patient declined opting for medical mgmt.   Prostate cancer [facility])    Sleep apnea    Stroke [facility])    Vitamin D deficiency disease     HEMATOLOGY/ONCOLOGY HISTORY:  Oncology History Overview Note  Metastatic prostate cancer to liver, bones and retroperitoneal lymph nodes   Prostate cancer metastatic to multiple sites [facility])  06/08/2021 Initial Diagnosis   Initial visit with Dr. Bernardo Heater of Urology for elevated PSA of 194.74   06/27/2021 Procedure   TRUS/transrectal prostate biopsies.    06/27/2021 Pathology Results   Diagnostic Summary   [A] PROSTATE, LEFT BASE:   ACINAR ADENOCARCINOMA, GLEASON 3+3=6  (GG  1), INVOLVING 1 OF 1 CORES, MEASURING 4  MM ( 20%).   [B] PROSTATE, LEFT MID:   ACINAR ADENOCARCINOMA, GLEASON 3+3=6  (GG 1),  INVOLVING 1 OF 2 CORES, MEASURING 4  MM ( 25%).   [C] PROSTATE, LEFT APEX:   ACINAR ADENOCARCINOMA, GLEASON 3+3=6  (GG  1), INVOLVING 2 OF 4 CORES, MEASURING 8  MM ( 42%).   [D] PROSTATE, RIGHT BASE:   NEGATIVE FOR MALIGNANCY.   [E] PROSTATE, RIGHT MID:   ACINAR ADENOCARCINOMA, GLEASON 3+3=6  (GG  1), INVOLVING 1 OF 3 CORES, MEASURING 8  MM ( 73%).   [F] PROSTATE, RIGHT APEX:   ACINAR ADENOCARCINOMA, GLEASON 3+3=6  (GG  1), INVOLVING 2 OF 2 CORES, MEASURING 22  MM ( 92%).   10/02/2021 Imaging  CT abdomen/pelvis  IMPRESSION: 1. Diffuse, heterogeneously lytic and sclerotic osseous metastatic disease involving the included axial skeleton. 2. Numerous hypodense metastatic liver lesions. 3. Numerous enlarged metastatic retroperitoneal lymph nodes. 4. Moderate hiatal hernia with intrathoracic position of the gastric fundus. 5. Mild prostatomegaly. 6. Aortic valve calcifications. Correlate for echocardiographic evidence of aortic valve dysfunction.  Bone scan - There are multiple foci of intense radiopharmaceutical uptake in the bones, highly  suspicious for metastatic disease. Prominent uptake is noted throughout the thoracolumbar spine, scattered throughout the pelvis, especially near the left sacroiliac joint, and in multiple ribs (greatest in approximately the posterior aspect of the right 7th rib).     10/26/2021 Cancer Staging   Staging form: Prostate, AJCC 8th Edition - Clinical: Stage IVB (cM1, Grade Group: 1) - Signed by [name], MD on 10/26/2021 Histologic grading system: 5 grade system     ALLERGIES:  has No Known Allergies.  MEDICATIONS:  Current Outpatient Medications  Medication Sig Dispense Refill   acetaminophen (TYLENOL) 500 MG tablet Take 500 mg by mouth every 6 (six) hours as needed.     ADVAIR DISKUS 250-50 MCG/ACT AEPB Inhale 1 puff into the lungs 2 (two) times daily.     alum & mag hydroxide-simeth (MAALOX/MYLANTA) 200-200-20 MG/5ML suspension Take by mouth every 6 (six) hours as needed for indigestion or heartburn.     aspirin 81 MG chewable tablet Chew 81 mg by mouth daily.     atorvastatin (LIPITOR) 80 MG tablet Take 80 mg by mouth at bedtime.     enzalutamide (XTANDI) 80 MG tablet Take 2 tablets (160 mg total) by mouth daily. 60 tablet 0   guaiFENesin (ROBAFEN) 100 MG/5ML liquid Take 5 mLs by mouth every 4 (four) hours as needed for cough or to loosen phlegm. (Patient not taking: Reported on 02/13/2022)     lisinopril (ZESTRIL) 40 MG tablet Take 40 mg by mouth daily.     loperamide (IMODIUM) 2 MG capsule Take by mouth as needed for diarrhea or loose stools.     magnesium hydroxide (MILK OF MAGNESIA) 400 MG/5ML suspension Take 30 mLs by mouth daily as needed for mild constipation.     megestrol (MEGACE) 20 MG tablet Take 20 mg by mouth daily.     Neomycin-Bacitracin-Polymyxin (HCA TRIPLE ANTIBIOTIC OINTMENT EX) Apply topically.     thiamine (VITAMIN B-1) 100 MG tablet Take 100 mg by mouth daily.     traMADol (ULTRAM) 50 MG tablet Take by mouth every 6 (six) hours as needed. (Patient not taking:  Reported on 02/13/2022)     No current facility-administered medications for this visit.   Facility-Administered Medications Ordered in Other Visits  Medication Dose Route Frequency Provider Last Rate Last Admin   leuprolide (6 Month) (ELIGARD) injection 45 mg  45 mg Subcutaneous Once [name], MD        VITAL SIGNS: There were no vitals taken for this visit. There were no vitals filed for this visit.  Estimated body mass index is 16.07 kg/m as calculated from the following:   Height as of 11/01/21: '5\' 8"'$  (1.727 m).   Weight as of an earlier encounter on 02/13/22: 47.9 kg (105 lb 11.2 oz).  LABS: CBC:    Component Value Date/Time   WBC 7.8 02/13/2022 1024   HGB 12.6 (L) 02/13/2022 1024   HGB 12.2 (L) 01/21/2014 0436   HCT 38.6 (L) 02/13/2022 1024   HCT 35.5 (L) 01/21/2014 0436   PLT 192 02/13/2022 1024   PLT  130 (L) 01/21/2014 0436   MCV 97.0 02/13/2022 1024   MCV 97 01/21/2014 0436   NEUTROABS 4.7 02/13/2022 1024   NEUTROABS 3.3 01/21/2014 0436   LYMPHSABS 1.9 02/13/2022 1024   LYMPHSABS 2.5 01/21/2014 0436   MONOABS 0.8 02/13/2022 1024   MONOABS 0.7 01/21/2014 0436   EOSABS 0.2 02/13/2022 1024   EOSABS 0.1 01/21/2014 0436   BASOSABS 0.1 02/13/2022 1024   BASOSABS 0.0 01/21/2014 0436   Comprehensive Metabolic Panel:    Component Value Date/Time   NA 137 02/13/2022 1024   NA 140 01/21/2014 0436   K 3.6 02/13/2022 1024   K 3.8 01/21/2014 0436   CL 106 02/13/2022 1024   CL 108 (H) 01/21/2014 0436   CO2 21 (L) 02/13/2022 1024   CO2 25 01/21/2014 0436   BUN 33 (H) 02/13/2022 1024   BUN 18 01/21/2014 0436   CREATININE 1.24 02/13/2022 1024   CREATININE 0.94 01/21/2014 0436   GLUCOSE 74 02/13/2022 1024   GLUCOSE 83 01/21/2014 0436   CALCIUM 8.3 (L) 02/13/2022 1024   CALCIUM 7.7 (L) 01/21/2014 0436   AST 69 (H) 02/13/2022 1024   ALT 25 02/13/2022 1024   ALKPHOS 525 (H) 02/13/2022 1024   BILITOT 0.7 02/13/2022 1024   PROT 6.0 (L) 02/13/2022 1024   ALBUMIN  3.6 02/13/2022 1024     Present during today's visit: patient and group home caregiver [facility] plan: Patient will start Xtandi once medication is in hand   Patient Education I spoke with patient for overview of new oral chemotherapy medication: enzalutamide   Administration: Counseled patient and Adam Good on administration, dosing, side effects, monitoring, drug-food interactions, safe handling, storage, and disposal. Patient will take 4 tablets (160 mg total) by mouth daily.  Side Effects: Side effects include but not limited to: fatigue and low risk of seizure.    Drug-drug Interactions (DDI): Enzalutamide may decrease the serum concentration of atorvastatin and tramadol. Monitor patient for decreased effectiveness of the listed medications. No baseline dose adjustment needed.   Adherence: After discussion with Adam Good no patient barriers to medication adherence identified. His medication is administered by group home caregivers  Reviewed with patient importance of keeping a medication schedule and plan for any missed doses.  Adam Good voiced understanding and appreciation. All questions answered. Medication handout provided.  Provided [facility] with Oral Chemotherapy Navigation Clinic phone number. Adam Good knows to call the office with questions or concerns. Oral Chemotherapy Navigation Clinic will continue to follow.  Adam Good expressed understanding and was in agreement with this plan. He also understands that He can call clinic at any time with any questions, concerns, or complaints.   Medication Access Issues: Prescription pending BI, it appears that patient has Medicaid  Follow-up plan: RTC in 6 weeks  Thank you for allowing me to participate in the care of this patient.   Time Total: 15 mins  Visit consisted of counseling and education on dealing with issues of symptom management in the setting of serious and potentially life-threatening illness.Greater than 50%  of  this time was spent counseling and coordinating care related to the above assessment and plan.  Signed by: Darl Pikes, PharmD, BCPS, Salley Slaughter, CPP Hematology/Oncology Clinical Pharmacist Practitioner [place]/DB/AP [facility] [phone]  02/13/2022 11:27 AM

## 2022-02-13 NOTE — Progress Notes (Signed)
[place] NOTE  Patient Care Team: Terrill Mohr, NP as PCP - General (Nurse Practitioner)  REFERRING PROVIDER: Dr. 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), Stage IV -High-volume disease with metastasis to liver, diffuse bony mets and retroperitoneal lymph nodes. -Status post prostate biopsy on 06/27/2021 which showed 3+3 adenocarcinoma.  Likely not a true representation of his disease. -PSA elevated to 194 in March 2023.  PSA from 12/12/21 prior to starting treatment 245  -Patient had multiple no-shows for starting Firmagon.  Eventually, he was started on 12/12/2021 with firmagon loading dose of 240 mg and received maintenance Firmagon x 1.  He is tolerating well and was transitioned to Eligard 28-monthinjection on 02/13/2022.  I will add enzalutamide 160 mg daily.  Side effects such as hot flashes, mood changes, falls, seizure, hypertension, heart issues were discussed. He is not a candidate for chemotherapy due to dementia, and multiple comorbidities.   -I do not have CT chest for complete staging.  Patient was no-show multiple times to CT chest appointment.  He is now already started on the treatment.  I will hold off on further scheduling.  Monitor PSA.  Essential hypertension - can worsen with ADT treatment. Will closely monitor - continue with lisinopril 40 mg daily   Dyslipidemia - continue with lipitor 80 mg daily   Weakness of right lower extremity -Per caregiver, this has been chronic. -I ordered MRI L-spine to rule out any metastatic process.  Patient was  no-show for the appointments. His weakness is stable.    Back pain - likely secondary to metastatic disease - continue with tramadol 50 mg q6 prn (prescribed by provider at group home)  RTC in 6 weeks for MD visit, labs  Orders Placed This Encounter  Procedures   CBC with Differential    Standing Status:   Future    Standing Expiration Date:   02/13/2023   Comprehensive metabolic panel    Standing Status:   Future    Standing Expiration Date:   02/13/2023   PSA    Standing Status:   Future    Standing Expiration Date:   02/14/2023   The total time spent in the appointment was 30 minutes encounter with patients including review of chart and various tests results, discussions about plan of care and coordination of care plan   All questions were answered. The patient knows to call the clinic with any problems, questions or concerns. No barriers to learning was detected.  KJane Canary MD 11/28/202311:39 AM   HISTORY OF PRESENTING ILLNESS:  Adam COKLEY89y.o. male is here because of new diagnosis of metastatic prostate cancer.   INTERVAL HISTORY-  Patient was seen today with [facility].  Patient has dementia and is hard of hearing.  He was unable to answer my questions appropriately.  Per caregiver, patient tolerated Firmagon injection well.  Denies any hot flashes.  Denies any shortness of breath, chest pain, fever, chills.    I have reviewed his chart and materials related to his cancer extensively and collaborated history with the patient.  Summary of oncologic history is as follows: Oncology History Overview Note  Metastatic prostate cancer to liver, bones and retroperitoneal lymph nodes   Prostate cancer metastatic to multiple sites [facility])  06/08/2021 Initial Diagnosis   Initial visit with Dr. Bernardo Heater of Urology for elevated PSA of 194.74   06/27/2021 Procedure   TRUS/transrectal prostate biopsies.    06/27/2021 Pathology Results    Diagnostic Summary   [A] PROSTATE, LEFT BASE:   ACINAR ADENOCARCINOMA, GLEASON 3+3=6  (GG  1), INVOLVING 1 OF 1 CORES, MEASURING 4  MM ( 20%).   [B] PROSTATE, LEFT MID:   ACINAR ADENOCARCINOMA, GLEASON 3+3=6  (GG 1),  INVOLVING 1 OF 2 CORES, MEASURING 4  MM ( 25%).   [C] PROSTATE, LEFT APEX:   ACINAR ADENOCARCINOMA, GLEASON 3+3=6  (GG  1), INVOLVING 2 OF 4 CORES, MEASURING 8  MM ( 42%).   [D] PROSTATE, RIGHT BASE:   NEGATIVE FOR MALIGNANCY.   [E] PROSTATE, RIGHT MID:   ACINAR ADENOCARCINOMA, GLEASON 3+3=6  (GG  1), INVOLVING 1 OF 3 CORES, MEASURING 8  MM ( 73%).   [F] PROSTATE, RIGHT APEX:   ACINAR ADENOCARCINOMA, GLEASON 3+3=6  (GG  1), INVOLVING 2 OF 2 CORES, MEASURING 22  MM ( 92%).   10/02/2021 Imaging   CT abdomen/pelvis  IMPRESSION: 1. Diffuse, heterogeneously lytic and sclerotic osseous metastatic disease involving the included axial skeleton. 2. Numerous hypodense metastatic liver lesions. 3. Numerous enlarged metastatic retroperitoneal lymph nodes. 4. Moderate hiatal hernia with intrathoracic position of the gastric fundus. 5. Mild prostatomegaly. 6. Aortic valve calcifications. Correlate for echocardiographic evidence of aortic valve dysfunction.  Bone scan - There are multiple foci of intense radiopharmaceutical uptake in the bones, highly suspicious for metastatic disease. Prominent uptake is noted throughout the thoracolumbar spine, scattered throughout the pelvis, especially near the left sacroiliac joint, and in multiple ribs (greatest in approximately the posterior aspect of the right 7th rib).     10/26/2021 Cancer Staging   Staging form: Prostate, AJCC 8th Edition - Clinical: Stage IVB (cM1, Grade Group: 1) - Signed by [name], MD on 10/26/2021 Histologic grading system: 5 grade system     MEDICAL HISTORY:  Past Medical History:  Diagnosis Date   Alcohol abuse    Alzheimer disease ([place])    Aortic stenosis 06/04/2017   a.) TTE 06/04/2017: mild; MPG  20 mmHg. b.) TTE 08/14/209: mild-mod: MPG 22 mmHg. c.) TTE 03/26/2018: mod: MPG 30 mmHg. d.) TTE 06/15/2021: severe; MPG 44.5 mmHG   Bipolar disorder (HCC)    Cardiac murmur    COPD (chronic obstructive pulmonary disease) ([place])    Demand ischemia of myocardium 01/2014   a.) positive troponins in the setting of a fall during intoxication; b.) Myoview 2/16: EF 65-70%, no evi of ischemia or infarct. No RWMA.; c.) MV 9/16: mild ischemia in the mid anterolateral, mid anterior, basal anterolateral and basal anterior segments, patient declined LHC   Dementia ([place])    Diastolic dysfunction    a.) TTE 9/16: EF of 50-55%, G2DD, severely dilated LA, and moderate AS; b.) TTE 3/19: EF of 50-55%, G1DD, mildly dilated LA, mildly calcified MV leaflets, mild AS, mild TR, trace PR, RVSP 46 mmHg, trivial pericardial effusion. c.) TTE 03/26/2018: EF 60-65%; mild LA dilation, mod AS (MPG 30 mmHG); G2DD. d.) TTE 06/08/2021: EF 60-65%; severe AS (MPG 44.5 mmHg); G1DD.   Dyslipidemia    Essential hypertension    Gastroesophageal reflux disease    HLD (hyperlipidemia)    HOH (  hard of hearing)    NSTEMI (non-ST elevated myocardial infarction) ([place]) 11/17/2014   a.) TTE with EF 50-55%; G3DD; severe dilated LA; mod AS; stress testing revealed mild ischemia in the mid anterolateral, mid anterior, basal and lateral, and basal anterior segments; LHC recommended, however patient declined opting for medical mgmt.   Prostate cancer ([place])    Sleep apnea    Stroke ([place])    Vitamin D deficiency disease     SURGICAL HISTORY: Past Surgical History:  Procedure Laterality Date   HERNIA REPAIR     KNEE SURGERY     PROSTATE BIOPSY N/A 06/27/2021   Procedure: PROSTATE BIOPSY;  Surgeon: Abbie Sons, MD;  Location: [facility] ORS;  Service: Urology;  Laterality: N/A;   TRANSRECTAL ULTRASOUND N/A 06/27/2021   Procedure: TRANSRECTAL ULTRASOUND;  Surgeon: Abbie Sons, MD;  Location: [facility] ORS;  Service: Urology;  Laterality: N/A;     SOCIAL HISTORY: Social History   Socioeconomic History   Marital status: Single    Spouse name: Not on file   Number of children: Not on file   Years of education: Not on file   Highest education level: Not on file  Occupational History   Not on file  Tobacco Use   Smoking status: Former    Packs/day: 0.50    Years: 57.00    Total pack years: 28.50    Types: Cigarettes    Quit date: 02/2021    Years since quitting: 0.9   Smokeless tobacco: Current    Types: Snuff  Vaping Use   Vaping Use: Former  Substance and Sexual Activity   Alcohol use: Not Currently    Comment: He has been a heavy alcohol drinker for > 30 yrs.  Stats that he quit 3 weeks ago.    Drug use: No   Sexual activity: Not Currently  Other Topics Concern   Not on file  Social History Narrative   Not on file   Social Determinants of Health   Financial Resource Strain: Not on file  Food Insecurity: Not on file  Transportation Needs: Not on file  Physical Activity: Not on file  Stress: Not on file  Social Connections: Not on file  Intimate Partner Violence: Not on file    FAMILY HISTORY: Family History  Problem Relation Age of Onset   Throat cancer Mother    Cancer Father    CAD Sister    Hypertension Sister    Diabetes Brother     ALLERGIES:  has No Known Allergies.  MEDICATIONS:  Current Outpatient Medications  Medication Sig Dispense Refill   acetaminophen (TYLENOL) 500 MG tablet Take 500 mg by mouth every 6 (six) hours as needed.     ADVAIR DISKUS 250-50 MCG/ACT AEPB Inhale 1 puff into the lungs 2 (two) times daily.     alum & mag hydroxide-simeth (MAALOX/MYLANTA) 200-200-20 MG/5ML suspension Take by mouth every 6 (six) hours as needed for indigestion or heartburn.     aspirin 81 MG chewable tablet Chew 81 mg by mouth daily.     atorvastatin (LIPITOR) 80 MG tablet Take 80 mg by mouth at bedtime.     lisinopril (ZESTRIL) 40 MG tablet Take 40 mg by mouth daily.     loperamide  (IMODIUM) 2 MG capsule Take by mouth as needed for diarrhea or loose stools.     magnesium hydroxide (MILK OF MAGNESIA) 400 MG/5ML suspension Take 30 mLs by mouth daily as needed for mild constipation.  megestrol (MEGACE) 20 MG tablet Take 20 mg by mouth daily.     Neomycin-Bacitracin-Polymyxin (HCA TRIPLE ANTIBIOTIC OINTMENT EX) Apply topically.     thiamine (VITAMIN B-1) 100 MG tablet Take 100 mg by mouth daily.     enzalutamide (XTANDI) 40 MG tablet Take 4 tablets (160 mg total) by mouth daily. 120 tablet 0   guaiFENesin (ROBAFEN) 100 MG/5ML liquid Take 5 mLs by mouth every 4 (four) hours as needed for cough or to loosen phlegm. (Patient not taking: Reported on 02/13/2022)     traMADol (ULTRAM) 50 MG tablet Take by mouth every 6 (six) hours as needed. (Patient not taking: Reported on 02/13/2022)     No current facility-administered medications for this visit.    REVIEW OF SYSTEMS:   Pertinent information mentioned in HPI All other systems were reviewed with the patient and are negative.  PHYSICAL EXAMINATION: ECOG PERFORMANCE STATUS: 3 - Symptomatic, >50% confined to bed  Vitals:   02/13/22 1046  BP: 123/87  Pulse: 83  Resp: 18  Temp: 97.6 F (36.4 C)  SpO2: 100%   Filed Weights   02/13/22 1046  Weight: 105 lb 11.2 oz (47.9 kg)    GENERAL:alert, no distress and comfortable SKIN: skin color, texture, turgor are normal, no rashes or significant lesions EYES: normal, conjunctiva are pink and non-injected, sclera clear OROPHARYNX:no exudate, no erythema and lips, buccal mucosa, and tongue normal  NECK: supple, thyroid normal size, non-tender, without nodularity LYMPH:  no palpable lymphadenopathy in the cervical, axillary or inguinal LUNGS: clear to auscultation and percussion with normal breathing effort HEART: regular rate & rhythm and no murmurs and no lower extremity edema ABDOMEN:abdomen soft, non-tender and normal bowel sounds Musculoskeletal:no cyanosis of digits  and no clubbing  PSYCH: alert & oriented x 2 to place and self NEURO: RLE weakness 3/5. Sensation intact  LABORATORY DATA:  I have reviewed the data as listed Lab Results  Component Value Date   WBC 7.8 02/13/2022   HGB 12.6 (L) 02/13/2022   HCT 38.6 (L) 02/13/2022   MCV 97.0 02/13/2022   PLT 192 02/13/2022   Recent Labs    12/11/21 1256 01/09/22 1034 02/13/22 1024  NA 136 137 137  K 4.1 3.7 3.6  CL 107 107 106  CO2 25 23 21*  GLUCOSE 88 79 74  BUN 21 19 33*  CREATININE 1.11 1.06 1.24  CALCIUM 8.5* 8.8* 8.3*  GFRNONAA >60 >60 >60  PROT 5.7* 6.3* 6.0*  ALBUMIN 3.2* 3.6 3.6  AST 41 44* 69*  ALT '20 18 25  '$ ALKPHOS 513* 603* 525*  BILITOT 0.7 0.8 0.7    RADIOGRAPHIC STUDIES: I have personally reviewed the radiological images as listed and agreed with the findings in the report. No results found.

## 2022-02-14 ENCOUNTER — Other Ambulatory Visit (HOSPITAL_COMMUNITY): Payer: Self-pay

## 2022-02-26 ENCOUNTER — Other Ambulatory Visit (HOSPITAL_COMMUNITY): Payer: Self-pay

## 2022-02-27 ENCOUNTER — Other Ambulatory Visit (HOSPITAL_COMMUNITY): Payer: Self-pay

## 2022-03-01 ENCOUNTER — Other Ambulatory Visit (HOSPITAL_COMMUNITY): Payer: Self-pay

## 2022-03-05 ENCOUNTER — Other Ambulatory Visit (HOSPITAL_COMMUNITY): Payer: Self-pay

## 2022-03-06 ENCOUNTER — Other Ambulatory Visit: Payer: Self-pay

## 2022-03-06 ENCOUNTER — Other Ambulatory Visit: Payer: Self-pay | Admitting: Internal Medicine

## 2022-03-06 ENCOUNTER — Other Ambulatory Visit (HOSPITAL_COMMUNITY): Payer: Self-pay

## 2022-03-06 DIAGNOSIS — C61 Malignant neoplasm of prostate: Secondary | ICD-10-CM

## 2022-03-06 MED ORDER — ENZALUTAMIDE 40 MG PO TABS
160.0000 mg | ORAL_TABLET | Freq: Every day | ORAL | 0 refills | Status: DC
  Filled 2022-03-06 – 2022-03-09 (×2): qty 120, 30d supply, fill #0

## 2022-03-09 ENCOUNTER — Other Ambulatory Visit (HOSPITAL_COMMUNITY): Payer: Self-pay

## 2022-03-27 ENCOUNTER — Inpatient Hospital Stay (HOSPITAL_BASED_OUTPATIENT_CLINIC_OR_DEPARTMENT_OTHER): Payer: Medicare Other | Admitting: Internal Medicine

## 2022-03-27 ENCOUNTER — Inpatient Hospital Stay: Payer: Medicare Other | Attending: Internal Medicine

## 2022-03-27 ENCOUNTER — Other Ambulatory Visit: Payer: Self-pay

## 2022-03-27 ENCOUNTER — Encounter: Payer: Self-pay | Admitting: Internal Medicine

## 2022-03-27 VITALS — BP 125/84 | HR 73 | Temp 98.6°F | Resp 20 | Wt 112.6 lb

## 2022-03-27 DIAGNOSIS — Z7951 Long term (current) use of inhaled steroids: Secondary | ICD-10-CM | POA: Diagnosis not present

## 2022-03-27 DIAGNOSIS — C7951 Secondary malignant neoplasm of bone: Secondary | ICD-10-CM | POA: Diagnosis not present

## 2022-03-27 DIAGNOSIS — C787 Secondary malignant neoplasm of liver and intrahepatic bile duct: Secondary | ICD-10-CM | POA: Insufficient documentation

## 2022-03-27 DIAGNOSIS — Z79818 Long term (current) use of other agents affecting estrogen receptors and estrogen levels: Secondary | ICD-10-CM

## 2022-03-27 DIAGNOSIS — E785 Hyperlipidemia, unspecified: Secondary | ICD-10-CM | POA: Diagnosis not present

## 2022-03-27 DIAGNOSIS — C61 Malignant neoplasm of prostate: Secondary | ICD-10-CM

## 2022-03-27 DIAGNOSIS — I10 Essential (primary) hypertension: Secondary | ICD-10-CM | POA: Insufficient documentation

## 2022-03-27 DIAGNOSIS — I252 Old myocardial infarction: Secondary | ICD-10-CM | POA: Diagnosis not present

## 2022-03-27 DIAGNOSIS — E870 Hyperosmolality and hypernatremia: Secondary | ICD-10-CM

## 2022-03-27 DIAGNOSIS — Z79899 Other long term (current) drug therapy: Secondary | ICD-10-CM | POA: Diagnosis not present

## 2022-03-27 DIAGNOSIS — D649 Anemia, unspecified: Secondary | ICD-10-CM | POA: Diagnosis not present

## 2022-03-27 DIAGNOSIS — Z7982 Long term (current) use of aspirin: Secondary | ICD-10-CM | POA: Diagnosis not present

## 2022-03-27 LAB — CBC WITH DIFFERENTIAL/PLATELET
Abs Immature Granulocytes: 0.1 10*3/uL — ABNORMAL HIGH (ref 0.00–0.07)
Basophils Absolute: 0.1 10*3/uL (ref 0.0–0.1)
Basophils Relative: 1 %
Eosinophils Absolute: 0.1 10*3/uL (ref 0.0–0.5)
Eosinophils Relative: 1 %
HCT: 33.4 % — ABNORMAL LOW (ref 39.0–52.0)
Hemoglobin: 10.8 g/dL — ABNORMAL LOW (ref 13.0–17.0)
Immature Granulocytes: 1 %
Lymphocytes Relative: 15 %
Lymphs Abs: 1.6 10*3/uL (ref 0.7–4.0)
MCH: 31.4 pg (ref 26.0–34.0)
MCHC: 32.3 g/dL (ref 30.0–36.0)
MCV: 97.1 fL (ref 80.0–100.0)
Monocytes Absolute: 0.7 10*3/uL (ref 0.1–1.0)
Monocytes Relative: 6 %
Neutro Abs: 7.9 10*3/uL — ABNORMAL HIGH (ref 1.7–7.7)
Neutrophils Relative %: 76 %
Platelets: 339 10*3/uL (ref 150–400)
RBC: 3.44 MIL/uL — ABNORMAL LOW (ref 4.22–5.81)
RDW: 15.6 % — ABNORMAL HIGH (ref 11.5–15.5)
WBC: 10.5 10*3/uL (ref 4.0–10.5)
nRBC: 0 % (ref 0.0–0.2)

## 2022-03-27 LAB — COMPREHENSIVE METABOLIC PANEL
ALT: 12 U/L (ref 0–44)
AST: 29 U/L (ref 15–41)
Albumin: 2.6 g/dL — ABNORMAL LOW (ref 3.5–5.0)
Alkaline Phosphatase: 532 U/L — ABNORMAL HIGH (ref 38–126)
Anion gap: 9 (ref 5–15)
BUN: 39 mg/dL — ABNORMAL HIGH (ref 8–23)
CO2: 21 mmol/L — ABNORMAL LOW (ref 22–32)
Calcium: 8 mg/dL — ABNORMAL LOW (ref 8.9–10.3)
Chloride: 120 mmol/L — ABNORMAL HIGH (ref 98–111)
Creatinine, Ser: 1.2 mg/dL (ref 0.61–1.24)
GFR, Estimated: >60 mL/min (ref >60)
Glucose, Bld: 117 mg/dL — ABNORMAL HIGH (ref 70–99)
Potassium: 3.6 mmol/L (ref 3.5–5.1)
Sodium: 150 mmol/L — ABNORMAL HIGH (ref 135–145)
Total Bilirubin: 0.9 mg/dL (ref 0.3–1.2)
Total Protein: 5 g/dL — ABNORMAL LOW (ref 6.5–8.1)

## 2022-03-27 LAB — PSA: Prostatic Specific Antigen: 7.39 ng/mL — ABNORMAL HIGH (ref 0.00–4.00)

## 2022-03-27 LAB — IRON AND TIBC
Iron: 45 ug/dL (ref 45–182)
Saturation Ratios: 20 % (ref 17.9–39.5)
TIBC: 228 ug/dL — ABNORMAL LOW (ref 250–450)
UIBC: 183 ug/dL

## 2022-03-27 LAB — FERRITIN: Ferritin: 426 ng/mL — ABNORMAL HIGH (ref 24–336)

## 2022-03-27 NOTE — Progress Notes (Signed)
Patient has no concerns 

## 2022-03-27 NOTE — Progress Notes (Signed)
[place] NOTE  Adam Good: Terrill Mohr, NP as PCP - General (Nurse Practitioner)  REFERRING PROVIDER: Dr. 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 -Status post prostate biopsy on 06/27/2021 which showed 3+3 adenocarcinoma.  Likely not a true representation of his disease. -PSA elevated to 194 in March 2023.  PSA from 12/12/21 prior to starting treatment 245  -Adam had multiple no-shows for starting Firmagon.  Eventually, he was started on 12/12/2021 with firmagon loading dose of 240 mg and received maintenance Firmagon x 1.  He was transitioned to Eligard 53-monthinjection on 02/13/2022. Started enzalutamide 160 mg daily on 02/23/2023. Tolerating well.   - He is not a candidate for chemotherapy due to dementia, and multiple comorbidities.   -Adam was no-show multiple times to CT chest appointment.  He is now already started on the treatment.  I will hold off on further scheduling.  Monitor PSA.  #normocytic anemia  - Hb 10.8 today. Add iron studies   #Hypernatremia  - Na 150. Suspecting dehydration. Encouraged to take fluid 50-60 ounces/day. Repeat BMP in 2 days. Recommendations were written in the facility paper and explained to caregiver CRandleman He will convey to the PCP who visits the facility.   #Essential hypertension - can worsen with ADT treatment. Will closely monitor - continue with lisinopril 40 mg daily   Dyslipidemia - continue  with lipitor 80 mg daily   Weakness of right lower extremity -Per caregiver, this has been chronic. - Adam was no-show for MRI appointments. His weakness is stable.     RTC in 8 weeks for MD visit, labs  Orders Placed This Encounter  Procedures   CBC with Differential    Standing Status:   Future    Standing Expiration Date:   03/27/2023   Comprehensive metabolic panel    Standing Status:   Future    Standing Expiration Date:   03/27/2023   PSA    Standing Status:   Future    Standing Expiration Date:   03/28/2023   The total time spent in the appointment was 30 minutes encounter with patients including review of chart and various tests results, discussions about plan of care and coordination of care plan   All questions were answered. The Adam knows to call the clinic with any problems, questions or concerns. No barriers to learning was detected.  KJane Canary MD 1/9/202411:12 AM   HISTORY OF PRESENTING ILLNESS:  Adam [id number]. male is here because of new diagnosis of metastatic prostate cancer.   INTERVAL HISTORY-  [facility].  Adam has dementia and is hard of hearing.  He was unable to answer my questions appropriately.   Information obtained from caregiver. He is tolerating Xtandi well. Denies any hot flashes, fatigue, fever, chills. Not drinking enough.    I have reviewed his chart and materials related to his cancer extensively and collaborated history with the Adam. Summary of oncologic history is as  follows: Oncology History Overview Note  Metastatic prostate cancer to liver, bones and retroperitoneal lymph nodes   Prostate cancer metastatic to multiple sites [facility])  06/08/2021 Initial Diagnosis   Initial visit with Dr. Bernardo Heater of Urology for elevated PSA of 194.74   06/27/2021 Procedure   TRUS/transrectal prostate biopsies.    06/27/2021 Pathology Results   Diagnostic Summary    [A] PROSTATE, LEFT BASE:   ACINAR ADENOCARCINOMA, GLEASON 3+3=6  (GG  1), INVOLVING 1 OF 1 CORES, MEASURING 4  MM ( 20%).   [B] PROSTATE, LEFT MID:   ACINAR ADENOCARCINOMA, GLEASON 3+3=6  (GG 1),  INVOLVING 1 OF 2 CORES, MEASURING 4  MM ( 25%).   [C] PROSTATE, LEFT APEX:   ACINAR ADENOCARCINOMA, GLEASON 3+3=6  (GG  1), INVOLVING 2 OF 4 CORES, MEASURING 8  MM ( 42%).   [D] PROSTATE, RIGHT BASE:   NEGATIVE FOR MALIGNANCY.   [E] PROSTATE, RIGHT MID:   ACINAR ADENOCARCINOMA, GLEASON 3+3=6  (GG  1), INVOLVING 1 OF 3 CORES, MEASURING 8  MM ( 73%).   [F] PROSTATE, RIGHT APEX:   ACINAR ADENOCARCINOMA, GLEASON 3+3=6  (GG  1), INVOLVING 2 OF 2 CORES, MEASURING 22  MM ( 92%).   10/02/2021 Imaging   CT abdomen/pelvis  IMPRESSION: 1. Diffuse, heterogeneously lytic and sclerotic osseous metastatic disease involving the included axial skeleton. 2. Numerous hypodense metastatic liver lesions. 3. Numerous enlarged metastatic retroperitoneal lymph nodes. 4. Moderate hiatal hernia with intrathoracic position of the gastric fundus. 5. Mild prostatomegaly. 6. Aortic valve calcifications. Correlate for echocardiographic evidence of aortic valve dysfunction.  Bone scan - There are multiple foci of intense radiopharmaceutical uptake in the bones, highly suspicious for metastatic disease. Prominent uptake is noted throughout the thoracolumbar spine, scattered throughout the pelvis, especially near the left sacroiliac joint, and in multiple ribs (greatest in approximately the posterior aspect of the right 7th rib).     10/26/2021 Cancer Staging   Staging form: Prostate, AJCC 8th Edition - Clinical: Stage IVB (cM1, Grade Group: 1) - Signed by [name], MD on 10/26/2021 Histologic grading system: 5 grade system     MEDICAL HISTORY:  Past Medical History:  Diagnosis Date   Alcohol abuse    Alzheimer disease ([place])    Aortic stenosis 06/04/2017   a.) TTE 06/04/2017: mild; MPG 20 mmHg. b.) TTE  08/14/209: mild-mod: MPG 22 mmHg. c.) TTE 03/26/2018: mod: MPG 30 mmHg. d.) TTE 06/15/2021: severe; MPG 44.5 mmHG   Bipolar disorder (HCC)    Cardiac murmur    COPD (chronic obstructive pulmonary disease) ([place])    Demand ischemia of myocardium 01/2014   a.) positive troponins in the setting of a fall during intoxication; b.) Myoview 2/16: EF 65-70%, no evi of ischemia or infarct. No RWMA.; c.) MV 9/16: mild ischemia in the mid anterolateral, mid anterior, basal anterolateral and basal anterior segments, Adam declined LHC   Dementia ([place])    Diastolic dysfunction    a.) TTE 9/16: EF of 50-55%, G2DD, severely dilated LA, and moderate AS; b.) TTE 3/19: EF of 50-55%, G1DD, mildly dilated LA, mildly calcified MV leaflets, mild AS, mild TR, trace PR, RVSP 46 mmHg, trivial pericardial effusion. c.) TTE 03/26/2018: EF 60-65%; mild LA dilation, mod AS (MPG 30 mmHG); G2DD. d.) TTE 06/08/2021: EF 60-65%; severe AS (MPG 44.5 mmHg); G1DD.   Dyslipidemia    Essential hypertension    Gastroesophageal reflux disease    HLD (hyperlipidemia)    HOH (hard of hearing)  NSTEMI (non-ST elevated myocardial infarction) ([place]) 11/17/2014   a.) TTE with EF 50-55%; G3DD; severe dilated LA; mod AS; stress testing revealed mild ischemia in the mid anterolateral, mid anterior, basal and lateral, and basal anterior segments; LHC recommended, however Adam declined opting for medical mgmt.   Prostate cancer ([place])    Sleep apnea    Stroke ([place])    Vitamin D deficiency disease     SURGICAL HISTORY: Past Surgical History:  Procedure Laterality Date   HERNIA REPAIR     KNEE SURGERY     PROSTATE BIOPSY N/A 06/27/2021   Procedure: PROSTATE BIOPSY;  Surgeon: Abbie Sons, MD;  Location: [facility] ORS;  Service: Urology;  Laterality: N/A;   TRANSRECTAL ULTRASOUND N/A 06/27/2021   Procedure: TRANSRECTAL ULTRASOUND;  Surgeon: Abbie Sons, MD;  Location: [facility] ORS;  Service: Urology;  Laterality: N/A;    SOCIAL  HISTORY: Social History   Socioeconomic History   Marital status: Single    Spouse name: Not on file   Number of children: Not on file   Years of education: Not on file   Highest education level: Not on file  Occupational History   Not on file  Tobacco Use   Smoking status: Former    Packs/day: 0.50    Years: 57.00    Total pack years: 28.50    Types: Cigarettes    Quit date: 02/2021    Years since quitting: 1.1   Smokeless tobacco: Current    Types: Snuff  Vaping Use   Vaping Use: Former  Substance and Sexual Activity   Alcohol use: Not Currently    Comment: He has been a heavy alcohol drinker for > 30 yrs.  Stats that he quit 3 weeks ago.    Drug use: No   Sexual activity: Not Currently  Other Topics Concern   Not on file  Social History Narrative   Not on file   Social Determinants of Health   Financial Resource Strain: Not on file  Food Insecurity: Not on file  Transportation Needs: Not on file  Physical Activity: Not on file  Stress: Not on file  Social Connections: Not on file  Intimate Partner Violence: Not on file    FAMILY HISTORY: Family History  Problem Relation Age of Onset   Throat cancer Mother    Cancer Father    CAD Sister    Hypertension Sister    Diabetes Brother     ALLERGIES:  has No Known Allergies.  MEDICATIONS:  Current Outpatient Medications  Medication Sig Dispense Refill   acetaminophen (TYLENOL) 500 MG tablet Take 500 mg by mouth every 6 (six) hours as needed.     ADVAIR DISKUS 250-50 MCG/ACT AEPB Inhale 1 puff into the lungs 2 (two) times daily.     alum & mag hydroxide-simeth (MAALOX/MYLANTA) 200-200-20 MG/5ML suspension Take by mouth every 6 (six) hours as needed for indigestion or heartburn.     aspirin 81 MG chewable tablet Chew 81 mg by mouth daily.     atorvastatin (LIPITOR) 80 MG tablet Take 80 mg by mouth at bedtime.     enzalutamide (XTANDI) 40 MG tablet Take 4 tablets (160 mg total) by mouth daily. 120 tablet 0    lisinopril (ZESTRIL) 40 MG tablet Take 40 mg by mouth daily.     loperamide (IMODIUM) 2 MG capsule Take by mouth as needed for diarrhea or loose stools.     magnesium hydroxide (MILK OF MAGNESIA) 400 MG/5ML suspension Take 30  mLs by mouth daily as needed for mild constipation.     megestrol (MEGACE) 20 MG tablet Take 20 mg by mouth daily.     Neomycin-Bacitracin-Polymyxin (HCA TRIPLE ANTIBIOTIC OINTMENT EX) Apply topically.     thiamine (VITAMIN B-1) 100 MG tablet Take 100 mg by mouth daily.     guaiFENesin (ROBAFEN) 100 MG/5ML liquid Take 5 mLs by mouth every 4 (four) hours as needed for cough or to loosen phlegm. (Adam not taking: Reported on 02/13/2022)     traMADol (ULTRAM) 50 MG tablet Take by mouth every 6 (six) hours as needed. (Adam not taking: Reported on 02/13/2022)     No current facility-administered medications for this visit.    REVIEW OF SYSTEMS:   Pertinent information mentioned in HPI All other systems were reviewed with the Adam and are negative.  PHYSICAL EXAMINATION: ECOG PERFORMANCE STATUS: 3 - Symptomatic, >50% confined to bed  Vitals:   03/27/22 1050  BP: 125/84  Pulse: 73  Resp: 20  Temp: 98.6 F (37 C)  SpO2: 100%   Filed Weights   03/27/22 1050  Weight: 112 lb 9.6 oz (51.1 kg)    GENERAL:alert, no distress and comfortable SKIN: skin color, texture, turgor are normal, no rashes or significant lesions EYES: normal, conjunctiva are pink and non-injected, sclera clear OROPHARYNX:no exudate, no erythema and lips, buccal mucosa, and tongue normal  NECK: supple, thyroid normal size, non-tender, without nodularity LYMPH:  no palpable lymphadenopathy in the cervical, axillary or inguinal LUNGS: clear to auscultation and percussion with normal breathing effort HEART: regular rate & rhythm and no murmurs and no lower extremity edema ABDOMEN:abdomen soft, non-tender and normal bowel sounds Musculoskeletal:no cyanosis of digits and no clubbing  PSYCH:  alert & oriented x 2 to place and self NEURO: RLE weakness 3/5. Sensation intact  LABORATORY DATA:  I have reviewed the data as listed Lab Results  Component Value Date   WBC 10.5 03/27/2022   HGB 10.8 (L) 03/27/2022   HCT 33.4 (L) 03/27/2022   MCV 97.1 03/27/2022   PLT 339 03/27/2022   Recent Labs    01/09/22 1034 02/13/22 1024 03/27/22 1021  NA 137 137 150*  K 3.7 3.6 3.6  CL 107 106 120*  CO2 23 21* 21*  GLUCOSE 79 74 117*  BUN 19 33* 39*  CREATININE 1.06 1.24 1.20  CALCIUM 8.8* 8.3* 8.0*  GFRNONAA >60 >60 >60  PROT 6.3* 6.0* 5.0*  ALBUMIN 3.6 3.6 2.6*  AST 44* 69* 29  ALT '18 25 12  '$ ALKPHOS 603* 525* 532*  BILITOT 0.8 0.7 0.9    RADIOGRAPHIC STUDIES: I have personally reviewed the radiological images as listed and agreed with the findings in the report. No results found.

## 2022-04-03 ENCOUNTER — Encounter: Payer: Self-pay | Admitting: Internal Medicine

## 2022-04-03 ENCOUNTER — Other Ambulatory Visit (HOSPITAL_COMMUNITY): Payer: Self-pay

## 2022-04-03 ENCOUNTER — Other Ambulatory Visit: Payer: Self-pay | Admitting: Internal Medicine

## 2022-04-03 DIAGNOSIS — C61 Malignant neoplasm of prostate: Secondary | ICD-10-CM

## 2022-04-03 MED ORDER — ENZALUTAMIDE 40 MG PO TABS
160.0000 mg | ORAL_TABLET | Freq: Every day | ORAL | 2 refills | Status: DC
  Filled 2022-04-03: qty 120, 30d supply, fill #0
  Filled 2022-06-04: qty 120, 30d supply, fill #1
  Filled 2022-06-21: qty 120, 30d supply, fill #2

## 2022-04-06 ENCOUNTER — Other Ambulatory Visit (HOSPITAL_COMMUNITY): Payer: Self-pay

## 2022-04-13 ENCOUNTER — Encounter
Admission: EM | Disposition: A | Discharge status: Skilled Nursing Facility | Payer: Self-pay | Source: Home / Self Care | Attending: Internal Medicine

## 2022-04-13 ENCOUNTER — Inpatient Hospital Stay: Payer: Medicare Other | Admitting: Certified Registered"

## 2022-04-13 ENCOUNTER — Emergency Department: Payer: Medicare Other

## 2022-04-13 ENCOUNTER — Inpatient Hospital Stay
Admission: EM | Admit: 2022-04-13 | Discharge: 2022-04-18 | DRG: 377 | Disposition: A | Discharge status: Skilled Nursing Facility | Payer: Medicare Other | Attending: Internal Medicine | Admitting: Internal Medicine

## 2022-04-13 DIAGNOSIS — C61 Malignant neoplasm of prostate: Secondary | ICD-10-CM | POA: Diagnosis present

## 2022-04-13 DIAGNOSIS — Z7982 Long term (current) use of aspirin: Secondary | ICD-10-CM

## 2022-04-13 DIAGNOSIS — R571 Hypovolemic shock: Secondary | ICD-10-CM | POA: Diagnosis present

## 2022-04-13 DIAGNOSIS — Z79899 Other long term (current) drug therapy: Secondary | ICD-10-CM

## 2022-04-13 DIAGNOSIS — K269 Duodenal ulcer, unspecified as acute or chronic, without hemorrhage or perforation: Secondary | ICD-10-CM | POA: Diagnosis not present

## 2022-04-13 DIAGNOSIS — D649 Anemia, unspecified: Secondary | ICD-10-CM | POA: Diagnosis present

## 2022-04-13 DIAGNOSIS — Z833 Family history of diabetes mellitus: Secondary | ICD-10-CM

## 2022-04-13 DIAGNOSIS — I110 Hypertensive heart disease with heart failure: Secondary | ICD-10-CM | POA: Diagnosis present

## 2022-04-13 DIAGNOSIS — C7951 Secondary malignant neoplasm of bone: Secondary | ICD-10-CM | POA: Diagnosis present

## 2022-04-13 DIAGNOSIS — E872 Acidosis, unspecified: Secondary | ICD-10-CM | POA: Insufficient documentation

## 2022-04-13 DIAGNOSIS — J449 Chronic obstructive pulmonary disease, unspecified: Secondary | ICD-10-CM | POA: Diagnosis present

## 2022-04-13 DIAGNOSIS — I5032 Chronic diastolic (congestive) heart failure: Secondary | ICD-10-CM | POA: Diagnosis present

## 2022-04-13 DIAGNOSIS — R578 Other shock: Secondary | ICD-10-CM | POA: Diagnosis present

## 2022-04-13 DIAGNOSIS — G309 Alzheimer's disease, unspecified: Secondary | ICD-10-CM | POA: Diagnosis present

## 2022-04-13 DIAGNOSIS — Z8249 Family history of ischemic heart disease and other diseases of the circulatory system: Secondary | ICD-10-CM

## 2022-04-13 DIAGNOSIS — I10 Essential (primary) hypertension: Secondary | ICD-10-CM | POA: Diagnosis present

## 2022-04-13 DIAGNOSIS — N390 Urinary tract infection, site not specified: Secondary | ICD-10-CM | POA: Diagnosis present

## 2022-04-13 DIAGNOSIS — K264 Chronic or unspecified duodenal ulcer with hemorrhage: Secondary | ICD-10-CM | POA: Diagnosis present

## 2022-04-13 DIAGNOSIS — K254 Chronic or unspecified gastric ulcer with hemorrhage: Secondary | ICD-10-CM | POA: Diagnosis present

## 2022-04-13 DIAGNOSIS — K922 Gastrointestinal hemorrhage, unspecified: Secondary | ICD-10-CM | POA: Diagnosis not present

## 2022-04-13 DIAGNOSIS — F319 Bipolar disorder, unspecified: Secondary | ICD-10-CM | POA: Diagnosis present

## 2022-04-13 DIAGNOSIS — Z8673 Personal history of transient ischemic attack (TIA), and cerebral infarction without residual deficits: Secondary | ICD-10-CM

## 2022-04-13 DIAGNOSIS — I252 Old myocardial infarction: Secondary | ICD-10-CM

## 2022-04-13 DIAGNOSIS — F0390 Unspecified dementia without behavioral disturbance: Secondary | ICD-10-CM | POA: Diagnosis not present

## 2022-04-13 DIAGNOSIS — L8990 Pressure ulcer of unspecified site, unspecified stage: Secondary | ICD-10-CM | POA: Diagnosis present

## 2022-04-13 DIAGNOSIS — N179 Acute kidney failure, unspecified: Secondary | ICD-10-CM | POA: Diagnosis present

## 2022-04-13 DIAGNOSIS — K219 Gastro-esophageal reflux disease without esophagitis: Secondary | ICD-10-CM | POA: Diagnosis present

## 2022-04-13 DIAGNOSIS — I350 Nonrheumatic aortic (valve) stenosis: Secondary | ICD-10-CM | POA: Diagnosis present

## 2022-04-13 DIAGNOSIS — F02C3 Dementia in other diseases classified elsewhere, severe, with mood disturbance: Secondary | ICD-10-CM | POA: Diagnosis present

## 2022-04-13 DIAGNOSIS — E785 Hyperlipidemia, unspecified: Secondary | ICD-10-CM | POA: Diagnosis present

## 2022-04-13 DIAGNOSIS — Z808 Family history of malignant neoplasm of other organs or systems: Secondary | ICD-10-CM

## 2022-04-13 DIAGNOSIS — Z7951 Long term (current) use of inhaled steroids: Secondary | ICD-10-CM

## 2022-04-13 DIAGNOSIS — Z87891 Personal history of nicotine dependence: Secondary | ICD-10-CM

## 2022-04-13 DIAGNOSIS — L89212 Pressure ulcer of right hip, stage 2: Secondary | ICD-10-CM | POA: Diagnosis present

## 2022-04-13 DIAGNOSIS — R601 Generalized edema: Secondary | ICD-10-CM

## 2022-04-13 DIAGNOSIS — E559 Vitamin D deficiency, unspecified: Secondary | ICD-10-CM | POA: Diagnosis present

## 2022-04-13 DIAGNOSIS — D72829 Elevated white blood cell count, unspecified: Secondary | ICD-10-CM

## 2022-04-13 DIAGNOSIS — R579 Shock, unspecified: Secondary | ICD-10-CM | POA: Insufficient documentation

## 2022-04-13 HISTORY — PX: ESOPHAGOGASTRODUODENOSCOPY: SHX5428

## 2022-04-13 LAB — COMPREHENSIVE METABOLIC PANEL
ALT: 28 U/L (ref 0–44)
AST: 96 U/L — ABNORMAL HIGH (ref 15–41)
Albumin: 1.7 g/dL — ABNORMAL LOW (ref 3.5–5.0)
Alkaline Phosphatase: 257 U/L — ABNORMAL HIGH (ref 38–126)
Anion gap: 14 (ref 5–15)
BUN: 68 mg/dL — ABNORMAL HIGH (ref 8–23)
CO2: 12 mmol/L — ABNORMAL LOW (ref 22–32)
Calcium: 7.2 mg/dL — ABNORMAL LOW (ref 8.9–10.3)
Chloride: 113 mmol/L — ABNORMAL HIGH (ref 98–111)
Creatinine, Ser: 2.26 mg/dL — ABNORMAL HIGH (ref 0.61–1.24)
GFR, Estimated: 30 mL/min — ABNORMAL LOW (ref >60)
Glucose, Bld: 132 mg/dL — ABNORMAL HIGH (ref 70–99)
Potassium: 4.4 mmol/L (ref 3.5–5.1)
Sodium: 139 mmol/L (ref 135–145)
Total Bilirubin: 1 mg/dL (ref 0.3–1.2)
Total Protein: 4 g/dL — ABNORMAL LOW (ref 6.5–8.1)

## 2022-04-13 LAB — PROTIME-INR
INR: 1.5 — ABNORMAL HIGH (ref 0.8–1.2)
Prothrombin Time: 18.2 seconds — ABNORMAL HIGH (ref 11.4–15.2)

## 2022-04-13 LAB — CBC WITH DIFFERENTIAL/PLATELET
Abs Immature Granulocytes: 0.12 10*3/uL — ABNORMAL HIGH (ref 0.00–0.07)
Basophils Absolute: 0.1 10*3/uL (ref 0.0–0.1)
Basophils Relative: 0 %
Eosinophils Absolute: 0.1 10*3/uL (ref 0.0–0.5)
Eosinophils Relative: 1 %
HCT: 24.7 % — ABNORMAL LOW (ref 39.0–52.0)
Hemoglobin: 7.9 g/dL — ABNORMAL LOW (ref 13.0–17.0)
Immature Granulocytes: 1 %
Lymphocytes Relative: 14 %
Lymphs Abs: 2.2 10*3/uL (ref 0.7–4.0)
MCH: 31.2 pg (ref 26.0–34.0)
MCHC: 32 g/dL (ref 30.0–36.0)
MCV: 97.6 fL (ref 80.0–100.0)
Monocytes Absolute: 0.7 10*3/uL (ref 0.1–1.0)
Monocytes Relative: 5 %
Neutro Abs: 11.9 10*3/uL — ABNORMAL HIGH (ref 1.7–7.7)
Neutrophils Relative %: 79 %
Platelets: 280 10*3/uL (ref 150–400)
RBC: 2.53 MIL/uL — ABNORMAL LOW (ref 4.22–5.81)
RDW: 15.4 % (ref 11.5–15.5)
WBC: 15.1 10*3/uL — ABNORMAL HIGH (ref 4.0–10.5)
nRBC: 0 % (ref 0.0–0.2)

## 2022-04-13 LAB — PREPARE RBC (CROSSMATCH)

## 2022-04-13 LAB — TROPONIN I (HIGH SENSITIVITY): Troponin I (High Sensitivity): 102 ng/L (ref <18)

## 2022-04-13 SURGERY — EGD (ESOPHAGOGASTRODUODENOSCOPY)
Anesthesia: Monitor Anesthesia Care

## 2022-04-13 MED ORDER — FLUTICASONE FUROATE-VILANTEROL 200-25 MCG/ACT IN AEPB
1.0000 | INHALATION_SPRAY | Freq: Every day | RESPIRATORY_TRACT | Status: DC
  Administered 2022-04-14 – 2022-04-18 (×4): 1 via RESPIRATORY_TRACT
  Filled 2022-04-13: qty 28

## 2022-04-13 MED ORDER — SODIUM CHLORIDE 0.9 % IV SOLN
10.0000 mL/h | Freq: Once | INTRAVENOUS | Status: AC
  Administered 2022-04-13: 10 mL/h via INTRAVENOUS

## 2022-04-13 MED ORDER — OCTREOTIDE LOAD VIA INFUSION
50.0000 ug | Freq: Once | INTRAVENOUS | Status: AC
  Administered 2022-04-13: 50 ug via INTRAVENOUS
  Filled 2022-04-13: qty 25

## 2022-04-13 MED ORDER — SODIUM BICARBONATE 8.4 % IV SOLN
INTRAVENOUS | Status: DC
  Filled 2022-04-13: qty 1000
  Filled 2022-04-13: qty 150
  Filled 2022-04-13: qty 1000
  Filled 2022-04-13: qty 150
  Filled 2022-04-13 (×2): qty 1000

## 2022-04-13 MED ORDER — PANTOPRAZOLE SODIUM 40 MG IV SOLR: 40.0000 mg | Freq: Two times a day (BID) | INTRAVENOUS | Status: DC

## 2022-04-13 MED ORDER — ATORVASTATIN CALCIUM 20 MG PO TABS
80.0000 mg | ORAL_TABLET | Freq: Every day | ORAL | Status: DC
  Administered 2022-04-16 – 2022-04-17 (×2): 80 mg via ORAL
  Filled 2022-04-13 (×3): qty 4

## 2022-04-13 MED ORDER — LACTATED RINGERS IV BOLUS
500.0000 mL | Freq: Once | INTRAVENOUS | Status: AC
  Administered 2022-04-13: 500 mL via INTRAVENOUS

## 2022-04-13 MED ORDER — PROPOFOL 10 MG/ML IV BOLUS
INTRAVENOUS | Status: AC
  Filled 2022-04-13: qty 20

## 2022-04-13 MED ORDER — SODIUM CHLORIDE 0.9 % IV SOLN
50.0000 ug/h | INTRAVENOUS | Status: DC
  Filled 2022-04-13: qty 1

## 2022-04-13 MED ORDER — PANTOPRAZOLE SODIUM 40 MG IV SOLR
40.0000 mg | Freq: Two times a day (BID) | INTRAVENOUS | Status: DC
  Administered 2022-04-14 – 2022-04-18 (×10): 40 mg via INTRAVENOUS
  Filled 2022-04-13 (×10): qty 10

## 2022-04-13 MED ORDER — ACETAMINOPHEN 500 MG PO TABS: 500.0000 mg | ORAL_TABLET | Freq: Four times a day (QID) | ORAL | Status: DC | PRN

## 2022-04-13 MED ORDER — FENTANYL CITRATE (PF) 100 MCG/2ML IJ SOLN
INTRAMUSCULAR | Status: DC | PRN
  Administered 2022-04-13: 50 ug via INTRAVENOUS
  Administered 2022-04-13 (×2): 25 ug via INTRAVENOUS

## 2022-04-13 MED ORDER — MEGESTROL ACETATE 20 MG PO TABS
20.0000 mg | ORAL_TABLET | Freq: Every day | ORAL | Status: DC
  Administered 2022-04-17 – 2022-04-18 (×2): 20 mg via ORAL
  Filled 2022-04-13 (×5): qty 1

## 2022-04-13 MED ORDER — SODIUM CHLORIDE (PF) 0.9 % IJ SOLN
PREFILLED_SYRINGE | INTRAMUSCULAR | Status: DC | PRN
  Administered 2022-04-13: 3 mL

## 2022-04-13 MED ORDER — FENTANYL CITRATE (PF) 100 MCG/2ML IJ SOLN
INTRAMUSCULAR | Status: AC
  Filled 2022-04-13: qty 2

## 2022-04-13 MED ORDER — DEXTROSE IN LACTATED RINGERS 5 % IV SOLN: INTRAVENOUS | Status: DC

## 2022-04-13 MED ORDER — MIDAZOLAM HCL 2 MG/2ML IJ SOLN
INTRAMUSCULAR | Status: AC
  Filled 2022-04-13: qty 2

## 2022-04-13 MED ORDER — ENZALUTAMIDE 40 MG PO TABS: 160.0000 mg | ORAL_TABLET | Freq: Every day | ORAL | Status: DC

## 2022-04-13 MED ORDER — MIDAZOLAM HCL 2 MG/2ML IJ SOLN
INTRAMUSCULAR | Status: DC | PRN
  Administered 2022-04-13: 1 mg via INTRAVENOUS
  Administered 2022-04-13: .25 mg via INTRAVENOUS
  Administered 2022-04-13: .5 mg via INTRAVENOUS

## 2022-04-13 MED ORDER — PANTOPRAZOLE SODIUM 40 MG IV SOLR
40.0000 mg | Freq: Once | INTRAVENOUS | Status: AC
  Administered 2022-04-13: 40 mg via INTRAVENOUS
  Filled 2022-04-13: qty 10

## 2022-04-13 MED ORDER — EPINEPHRINE 1 MG/10ML IJ SOSY
PREFILLED_SYRINGE | INTRAMUSCULAR | Status: AC
  Filled 2022-04-13: qty 10

## 2022-04-13 MED ORDER — SODIUM CHLORIDE 0.9 % IV SOLN
2.0000 g | Freq: Once | INTRAVENOUS | Status: AC
  Administered 2022-04-13: 2 g via INTRAVENOUS
  Filled 2022-04-13: qty 20

## 2022-04-13 MED ORDER — ALUM & MAG HYDROXIDE-SIMETH 200-200-20 MG/5ML PO SUSP: 15.0000 mL | Freq: Four times a day (QID) | ORAL | Status: DC | PRN

## 2022-04-13 NOTE — Brief Op Note (Signed)
2nd unit of PRBCs completed at 2030, total of 247m.

## 2022-04-13 NOTE — H&P (Signed)
History and Physical    Patient: Adam Good FBP:[id number] DOB: Oct 06, 1945 DOA: 04/13/2022 DOS: the patient was seen and examined on 04/13/2022 PCP: Terrill Mohr, NP  Patient coming from:     Chief Complaint:  Chief Complaint  Patient presents with   GI Bleeding   Hypotension   HPI: Adam Good is a 77 y.o. male with medical history significant of Rather severe dementia.  Patient is therefore not able to contribute to the history of presenting illness.  Patient is actually under the guardianship of the state.  Per report obtained from the ER provider, patient was noted to have bloody bowel movements.  Subsequently sent to the ER and noted to have severe hypotension.  When encountered by the author, patient is conversant but not really much of a historian.  Patient does not offer any complaints  There is no report of patient having previous similar episodes, no report of any bleeding elsewhere such as gum bleeding nosebleeding skin bruising Review of Systems: Unable to review all systems due to lack of cooperation from patient. Past Medical History:  Diagnosis Date   Alcohol abuse    Alzheimer disease ([place])    Aortic stenosis 06/04/2017   a.) TTE 06/04/2017: mild; MPG 20 mmHg. b.) TTE 08/14/209: mild-mod: MPG 22 mmHg. c.) TTE 03/26/2018: mod: MPG 30 mmHg. d.) TTE 06/15/2021: severe; MPG 44.5 mmHG   Bipolar disorder (HCC)    Cardiac murmur    COPD (chronic obstructive pulmonary disease) ([place])    Demand ischemia of myocardium 01/2014   a.) positive troponins in the setting of a fall during intoxication; b.) Myoview 2/16: EF 65-70%, no evi of ischemia or infarct. No RWMA.; c.) MV 9/16: mild ischemia in the mid anterolateral, mid anterior, basal anterolateral and basal anterior segments, patient declined LHC   Dementia ([place])    Diastolic dysfunction    a.) TTE 9/16: EF of 50-55%, G2DD, severely dilated LA, and moderate AS; b.) TTE 3/19: EF of 50-55%, G1DD, mildly dilated LA,  mildly calcified MV leaflets, mild AS, mild TR, trace PR, RVSP 46 mmHg, trivial pericardial effusion. c.) TTE 03/26/2018: EF 60-65%; mild LA dilation, mod AS (MPG 30 mmHG); G2DD. d.) TTE 06/08/2021: EF 60-65%; severe AS (MPG 44.5 mmHg); G1DD.   Dyslipidemia    Essential hypertension    Gastroesophageal reflux disease    HLD (hyperlipidemia)    HOH (hard of hearing)    NSTEMI (non-ST elevated myocardial infarction) ([place]) 11/17/2014   a.) TTE with EF 50-55%; G3DD; severe dilated LA; mod AS; stress testing revealed mild ischemia in the mid anterolateral, mid anterior, basal and lateral, and basal anterior segments; LHC recommended, however patient declined opting for medical mgmt.   Prostate cancer ([place])    Sleep apnea    Stroke ([place])    Vitamin D deficiency disease    Past Surgical History:  Procedure Laterality Date   HERNIA REPAIR     KNEE SURGERY     PROSTATE BIOPSY N/A 06/27/2021   Procedure: PROSTATE BIOPSY;  Surgeon: Abbie Sons, MD;  Location: [facility] ORS;  Service: Urology;  Laterality: N/A;   TRANSRECTAL ULTRASOUND N/A 06/27/2021   Procedure: TRANSRECTAL ULTRASOUND;  Surgeon: Abbie Sons, MD;  Location: [facility] ORS;  Service: Urology;  Laterality: N/A;   Social History:  reports that he quit smoking about 13 months ago. His smoking use included cigarettes. He has a 28.50 pack-year smoking history. His smokeless tobacco use includes snuff. He reports that he does not currently  use alcohol. He reports that he does not use drugs.  No Known Allergies  Family History  Problem Relation Age of Onset   Throat cancer Mother    Cancer Father    CAD Sister    Hypertension Sister    Diabetes Brother     Prior to Admission medications   Medication Sig Start Date End Date Taking? Authorizing Provider  acetaminophen (TYLENOL) 500 MG tablet Take 500 mg by mouth every 6 (six) hours as needed.    [provider]  ADVAIR DISKUS 250-50 MCG/ACT AEPB Inhale 1 puff into the lungs 2  (two) times daily. 05/30/21   [provider]  alum & mag hydroxide-simeth (MAALOX/MYLANTA) 200-200-20 MG/5ML suspension Take by mouth every 6 (six) hours as needed for indigestion or heartburn.    [provider]  aspirin 81 MG chewable tablet Chew 81 mg by mouth daily.    [provider]  atorvastatin (LIPITOR) 80 MG tablet Take 80 mg by mouth at bedtime. 05/18/21   [provider]  enzalutamide Gillermina Phy) 40 MG tablet Take 4 tablets (160 mg total) by mouth daily. 04/03/22   Darl Pikes, RPH-CPP  guaiFENesin (ROBAFEN) 100 MG/5ML liquid Take 5 mLs by mouth every 4 (four) hours as needed for cough or to loosen phlegm. Patient not taking: Reported on 02/13/2022    [provider]  lisinopril (ZESTRIL) 40 MG tablet Take 40 mg by mouth daily. 05/18/21   [provider]  loperamide (IMODIUM) 2 MG capsule Take by mouth as needed for diarrhea or loose stools.    [provider]  magnesium hydroxide (MILK OF MAGNESIA) 400 MG/5ML suspension Take 30 mLs by mouth daily as needed for mild constipation.    [provider]  megestrol (MEGACE) 20 MG tablet Take 20 mg by mouth daily. 06/01/21   [provider]  Neomycin-Bacitracin-Polymyxin (HCA TRIPLE ANTIBIOTIC OINTMENT EX) Apply topically.    [provider]  thiamine (VITAMIN B-1) 100 MG tablet Take 100 mg by mouth daily.    [provider]  traMADol (ULTRAM) 50 MG tablet Take by mouth every 6 (six) hours as needed. Patient not taking: Reported on 02/13/2022    [provider]    Physical Exam: Vitals:   04/13/22 1955 04/13/22 2025 04/13/22 2100 04/13/22 2135  BP: 106/74 (!) 86/62 105/69 (!) 116/92  Pulse: 73 67 65 69  Resp: 20 20 (!) 23 20  Temp: (!) 96.2 F (35.7 C) (!) 96.6 F (35.9 C)    TempSrc: Axillary Axillary    SpO2: 93% 94% 93%    Patient is awake, however does not really talk much. Does not appear to be in any distress Respiratory  exam bilateral intravesicular Cardiovascular exam S1-S2 normal Extremities: There seems to be some edema bilateral upper extremities.  However there is no edema of lower extremities or near the pelvis Exam is nonfocal, patient seems to have strength in all 4 extremities Data Reviewed:  Results for orders placed or performed during the hospital encounter of 04/13/22 (from the past 24 hour(s))  Comprehensive metabolic panel     Status: Abnormal   Collection Time: 04/13/22  6:27 PM  Result Value Ref Range   Sodium 139 135 - 145 mmol/L   Potassium 4.4 3.5 - 5.1 mmol/L   Chloride 113 (H) 98 - 111 mmol/L   CO2 12 (L) 22 - 32 mmol/L   Glucose, Bld 132 (H) 70 - 99 mg/dL   BUN 68 (H) 8 -  23 mg/dL   Creatinine, Ser 2.26 (H) 0.61 - 1.24 mg/dL   Calcium 7.2 (L) 8.9 - 10.3 mg/dL   Total Protein 4.0 (L) 6.5 - 8.1 g/dL   Albumin 1.7 (L) 3.5 - 5.0 g/dL   AST 96 (H) 15 - 41 U/L   ALT 28 0 - 44 U/L   Alkaline Phosphatase 257 (H) 38 - 126 U/L   Total Bilirubin 1.0 0.3 - 1.2 mg/dL   GFR, Estimated 30 (L) >60 mL/min   Anion gap 14 5 - 15  CBC WITH DIFFERENTIAL     Status: Abnormal   Collection Time: 04/13/22  6:27 PM  Result Value Ref Range   WBC 15.1 (H) 4.0 - 10.5 K/uL   RBC 2.53 (L) 4.22 - 5.81 MIL/uL   Hemoglobin 7.9 (L) 13.0 - 17.0 g/dL   HCT 24.7 (L) 39.0 - 52.0 %   MCV 97.6 80.0 - 100.0 fL   MCH 31.2 26.0 - 34.0 pg   MCHC 32.0 30.0 - 36.0 g/dL   RDW 15.4 11.5 - 15.5 %   Platelets 280 150 - 400 K/uL   nRBC 0.0 0.0 - 0.2 %   Neutrophils Relative % 79 %   Neutro Abs 11.9 (H) 1.7 - 7.7 K/uL   Lymphocytes Relative 14 %   Lymphs Abs 2.2 0.7 - 4.0 K/uL   Monocytes Relative 5 %   Monocytes Absolute 0.7 0.1 - 1.0 K/uL   Eosinophils Relative 1 %   Eosinophils Absolute 0.1 0.0 - 0.5 K/uL   Basophils Relative 0 %   Basophils Absolute 0.1 0.0 - 0.1 K/uL   Immature Granulocytes 1 %   Abs Immature Granulocytes 0.12 (H) 0.00 - 0.07 K/uL  Protime-INR     Status: Abnormal   Collection Time:  04/13/22  6:27 PM  Result Value Ref Range   Prothrombin Time 18.2 (H) 11.4 - 15.2 seconds   INR 1.5 (H) 0.8 - 1.2  Type and screen [facility]     Status: None   Collection Time: 04/13/22  6:27 PM  Result Value Ref Range   ABO/RH(D) O POS    Antibody Screen NEG    Sample Expiration      04/16/2022,2359 Performed at [facility], [address] [place], [place] [address]   Troponin I (High Sensitivity)     Status: Abnormal   Collection Time: 04/13/22  6:27 PM  Result Value Ref Range   Troponin I (High Sensitivity) 102 (HH) <18 ng/L    Assessment and Plan: * Gastrointestinal hemorrhage Gastroenterology has been engaged and plan is for the patient to get an upper GI endoscopy.  At this time source of GI bleeding is not known including not known if it is upper or lower.  We will trend CBC and transfuse as needed.  Check PTT continue with pantoprazole, octreotide as per GI  Leukocytosis At this time likely due to shock.  Patient did receive ceftriaxone in the ER, if further concern for infection we will have to reorder this.  I am not reordering ceftriaxone at this time  Metabolic acidosis Uncertain etiology, check urine sodium potassium and chloride as well as urine pH.  Start sodium bicarbonate infusion  Shock ([place]) This was hypovolemic, patient received blood as well as fluids, this is improved, monitor intake and output  Prostate cancer metastatic to multiple sites [facility]) Continue with chronic antiandrogen therapy.  Check INR PTT to make sure no DIC      Advance Care  Planning:   Code Status: Full Code  Consults: GI  Family Communication: N/A  Severity of Illness: The appropriate patient status for this patient is INPATIENT. Inpatient status is judged to be reasonable and necessary in order to provide the required intensity of service to ensure the patient's safety. The patient's presenting symptoms, physical exam findings, and initial  radiographic and laboratory data in the context of their chronic comorbidities is felt to place them at high risk for further clinical deterioration. Furthermore, it is not anticipated that the patient will be medically stable for discharge from the hospital within 2 midnights of admission.   * I certify that at the point of admission it is my clinical judgment that the patient will require inpatient hospital care spanning beyond 2 midnights from the point of admission due to high intensity of service, high risk for further deterioration and high frequency of surveillance required.*  Author: Gertie Fey, MD 04/13/2022 9:53 PM  For on call review [email or web address].

## 2022-04-13 NOTE — ED Notes (Signed)
Transfer of care report given to Sydnee Cabal RN over telephone and at bedside.  Pt is stable at this time.  Pt currently receiving 2nd unit of blood and Octreotide.

## 2022-04-13 NOTE — ED Triage Notes (Signed)
Pt brought in via ems from a group home. Pt brought in due to hypotension and bloody stool. Pt non-verbal at baseline.

## 2022-04-13 NOTE — Assessment & Plan Note (Signed)
This was hypovolemic, patient received blood as well as fluids, this is improved, monitor intake and output

## 2022-04-13 NOTE — Assessment & Plan Note (Signed)
Noted to have leukocytosis and UTI on admission.  Urine culture not done.  Received IV Rocephin x 3 days.

## 2022-04-13 NOTE — ED Notes (Addendum)
Pt has a large amount of fecal occult positive stool in diaper. MD aware.

## 2022-04-13 NOTE — Anesthesia Preprocedure Evaluation (Signed)
Anesthesia Evaluation  Patient identified by MRN, date of birth, ID band Patient confused  General Assessment Comment:  Patient has legal guardian Patient fell and hit his head this morning. No apparent deficits.  Reviewed: Allergy & Precautions, NPO status , Patient's Chart, lab work & pertinent test results  History of Anesthesia Complications Negative for: history of anesthetic complications  Airway Mallampati: III  TM Distance: >3 FB Neck ROM: Full    Dental  (+) Edentulous Upper, Edentulous Lower   Pulmonary sleep apnea , COPD, Patient abstained from smoking.Not current smoker, former smoker   Pulmonary exam normal breath sounds clear to auscultation       Cardiovascular Exercise Tolerance: Poor METShypertension, Pt. on medications + Past MI  (-) CAD (-) dysrhythmias + Valvular Problems/Murmurs AS  Rhythm:Regular Rate:Normal + Systolic murmurs ? Patient suffered an NSTEMI on 11/17/2014.  TTE at that time revealed a normal left ventricular ejection fraction of 50-55% with restrictive filling pattern (G3DD), a severely dilated left atrium, and moderate aortic valve stenosis.  Patient underwent stress testing that demonstrated mild ischemia in the mid anterolateral, mid anterior, basal and lateral, and basal anterior segments.  Cardiac catheterization was recommended, however patient declined opting for medical management.   ? Last myocardial perfusion imaging study performed on 10/30/2017 revealed a normal left ventricular systolic function with an EF 58%.  Resting ECG revealed LVH and repolarization abnormality.  There was a T wave abnormality noted in the precordial and inferior leads.  Study did not demonstrate any evidence of significant ischemia, and therefore determined to be low risk.   ? Last TTE was performed on 06/15/2021 revealing normal left ventricular systolic function with an EF of 60 to 65%. Diastolic Doppler  parameters consistent with abnormal relaxation (G1DD).  PASP elevated; estimated RVSP 52.3.  There was severe calcification of the aortic valve causing severe stenosis; mean gradient 44.5 mmHg; AVA (VTI) = 0.66 cm.  Degree of stenosis and aortic valve progressive from previous TTE performed back in 03/2018, at which time transvalvular gradient was 30 mmHg.  Patient referred to structural cardiology clinic for further evaluation and consideration of management options, which will ultimately be a TAVR procedure.   Per cardiology, "recent echocardiogram from performed showing progression of aortic valve stenosis to now severe.  However, given low risk nature of prostate procedure, patient remains at an ACCEPTABLE risk to proceed with planned procedure without further cardiovascular testing".    Neuro/Psych  PSYCHIATRIC DISORDERS   Bipolar Disorder  Dementia CVA    GI/Hepatic ,GERD  Controlled,,(+)     (-) substance abuse    Endo/Other  neg diabetes    Renal/GU negative Renal ROS     Musculoskeletal   Abdominal   Peds  Hematology   Anesthesia Other Findings Past Medical History: No date: Alcohol abuse 06/04/2017: Aortic stenosis     Comment:  a.) TTE 06/04/2017: mild; MPG 20 mmHg. b.) TTE               08/14/209: mild-mod: MPG 22 mmHg. c.) TTE 03/26/2018:               mod: MPG 30 mmHg. d.) TTE 06/15/2021: severe; MPG 44.5               mmHG No date: Bipolar disorder (HCC) No date: Cardiac murmur No date: COPD (chronic obstructive pulmonary disease) ([place]) 01/2014: Demand ischemia of myocardium (HCC)     Comment:  a.) positive troponins in the setting of a fall during  intoxication; b.) Myoview 2/16: EF 65-70%, no evi of               ischemia or infarct. No RWMA.; c.) MV 9/16: mild ischemia              in the mid anterolateral, mid anterior, basal               anterolateral and basal anterior segments, patient               declined LHC No date: Dementia  ([place]) No date: Diastolic dysfunction     Comment:  a.) TTE 9/16: EF of 50-55%, G2DD, severely dilated LA,               and moderate AS; b.) TTE 3/19: EF of 50-55%, G1DD, mildly              dilated LA, mildly calcified MV leaflets, mild AS, mild               TR, trace PR, RVSP 46 mmHg, trivial pericardial effusion.              c.) TTE 03/26/2018: EF 60-65%; mild LA dilation, mod AS               (MPG 30 mmHG); G2DD. d.) TTE 06/08/2021: EF 60-65%;               severe AS (MPG 44.5 mmHg); G1DD. No date: Dyslipidemia No date: Essential hypertension No date: Gastroesophageal reflux disease No date: HLD (hyperlipidemia) No date: HOH (hard of hearing) 11/17/2014: NSTEMI (non-ST elevated myocardial infarction) ([place])     Comment:  a.) TTE with EF 50-55%; G3DD; severe dilated LA; mod AS;              stress testing revealed mild ischemia in the mid               anterolateral, mid anterior, basal and lateral, and basal              anterior segments; LHC recommended, however patient               declined opting for medical mgmt. No date: Sleep apnea No date: Stroke [facility])  Reproductive/Obstetrics                              Anesthesia Physical Anesthesia Plan  ASA: 4 and emergent  Anesthesia Plan: General   Post-op Pain Management:    Induction: Intravenous  PONV Risk Score and Plan: 3 and Ondansetron, Midazolam and TIVA  Airway Management Planned: Natural Airway  Additional Equipment: None  Intra-op Plan:   Post-operative Plan:   Informed Consent: I have reviewed the patients History and Physical, chart, labs and discussed the procedure including the risks, benefits and alternatives for the proposed anesthesia with the patient or authorized representative who has indicated his/her understanding and acceptance.     Dental advisory given and Consent reviewed with POA  Plan Discussed with: CRNA and Surgeon  Anesthesia Plan Comments: (Patient NPO  appropriate. No vomiting or bleeding from above.  Discussed risks of anesthesia with DSS representative via phone [place], including possibility of difficulty with spontaneous ventilation under anesthesia necessitating airway intervention, PONV, and rare risks such as cardiac or respiratory or neurological events, and allergic reactions. She was counseled on being higher risk for anesthesia due to comorbidities: severe AS. She was told about increased risk  of cardiac and respiratory events, including death. I stressed his higher risk despite cardiology's determination of adequate optimization and adequate risk, even though they referred him for a TAVR.  Discussed the role of CRNA in patient's perioperative care. She understands. She provided consent.)         Anesthesia Quick Evaluation

## 2022-04-13 NOTE — ED Notes (Signed)
[place] [phone]

## 2022-04-13 NOTE — ED Notes (Signed)
2nd unit of blood verified by this RN and [facility] manager.

## 2022-04-13 NOTE — Assessment & Plan Note (Addendum)
Continue with chronic antiandrogen therapy.

## 2022-04-13 NOTE — Op Note (Signed)
[facility] Patient Name: Adam Good Procedure Date: 04/13/2022 9:42 PM MRN: [id number] Account #: [id number] Date of Birth: August 25, 1945 Admit Type: Inpatient Age: 77 Room: [facility] ENDO ROOM 4 Gender: Male Note Status: Finalized Instrument Name: Upper Endoscope [id number] Procedure:             Upper GI endoscopy Indications:           Melena Providers:             Lucilla Lame MD, MD Medicines:             Monitored Anesthesia Care Complications:         No immediate complications. Procedure:             Pre-Anesthesia Assessment:                        - Prior to the procedure, a History and Physical was                         performed, and patient medications and allergies were                         reviewed. The patient's tolerance of previous                         anesthesia was also reviewed. The risks and benefits                         of the procedure and the sedation options and risks                         were discussed with the patient. All questions were                         answered, and informed consent was obtained. Prior                         Anticoagulants: The patient has taken no anticoagulant                         or antiplatelet agents. ASA Grade Assessment: III - A                         patient with severe systemic disease. After reviewing                         the risks and benefits, the patient was deemed in                         satisfactory condition to undergo the procedure.                        After obtaining informed consent, the endoscope was                         passed under direct vision. Throughout the procedure,                         the patient's blood pressure,  pulse, and oxygen                         saturations were monitored continuously. The Endoscope                         was introduced through the mouth, and advanced to the                         second part of duodenum. The  upper GI endoscopy was                         accomplished without difficulty. The patient tolerated                         the procedure well. Findings:      The examined esophagus was normal.      One oozing cratered gastric ulcer with a visible vessel was found in the       gastric antrum. Area was successfully injected with 4 mL of a 0.1 mg/mL       solution of epinephrine for hemostasis. Coagulation for hemostasis using       bipolar probe was successful.      One non-bleeding cratered gastric ulcer with pigmented material was       found in the gastric antrum.      One non-bleeding superficial duodenal ulcer with no stigmata of bleeding       was found in the duodenal bulb. Impression:            - Normal esophagus.                        - Oozing gastric ulcer with a visible vessel.                         Injected. Treated with bipolar cautery.                        - Non-bleeding gastric ulcer with pigmented material.                        - Non-bleeding duodenal ulcer with no stigmata of                         bleeding.                        - No specimens collected. Recommendation:        - Return patient to ICU for ongoing care.                        - NPO.                        - Continue present medications.                        - If any further bleeding then Vascular vs. General                         surgery Procedure Code(s):     ---  Professional ---                        [id number], Esophagogastroduodenoscopy, flexible,                         transoral; with control of bleeding, any method Diagnosis Code(s):     --- Professional ---                        K92.1, Melena (includes Hematochezia)                        K26.9, Duodenal ulcer, unspecified as acute or                         chronic, without hemorrhage or perforation                        K25.4, Chronic or unspecified gastric ulcer with                         hemorrhage CPT copyright 2022  American Medical Association. All rights reserved. The codes documented in this report are preliminary and upon coder review may  be revised to meet current compliance requirements. Lucilla Lame MD, MD 04/13/2022 83:25:49 PM This report has been signed electronically. Number of Addenda: 0 Note Initiated On: 04/13/2022 9:42 PM Estimated Blood Loss:  Estimated blood loss: none.      [facility]

## 2022-04-13 NOTE — Assessment & Plan Note (Signed)
Uncertain etiology, check urine sodium potassium and chloride as well as urine pH.  Start sodium bicarbonate infusion

## 2022-04-13 NOTE — Consult Note (Signed)
Lucilla Lame, MD [facility]  [address] [place], [place] [address] Phone: [phone] Fax : [phone]  Consultation  Referring Provider:     Dr. Jacqualine Code Primary Care Physician:  Terrill Mohr, NP Primary Gastroenterologist: Althia Forts         Reason for Consultation:     GI bleed  Date of Admission:  04/13/2022 Date of Consultation:  04/13/2022         HPI:   Adam Good is a 77 y.o. male who is unable to give any history due to dementia.  The patient has a history of metastatic prostate cancer alcohol abuse and is a ward of DSS.  The patient was noted in his assisted living's abdomen to have low blood pressure and brought to the ED.  The patient was found to azotemia and anemia.  The patient was also noted to have hypotension.  The patient was transfused 2 units of blood and a GI consult was called.  She was following with oncology for normocytic anemia but in the ED was noted on arrival to have a large amount of dark blood in his diaper.  The patient has an elevated INR at 1.5.  His hemoglobin 4 months ago was 11.4 with it being 12.83 months ago and the patient came in with a hemoglobin of 7.9 with a white cell count of 15.1.  The patient was found to have low albumin but normal platelets and no pre-existing diagnosis of cirrhosis.  CT scan done today showed bony mets from his prostate cancer which was reported to be progressive from previous imaging.  On his previous CT scan the patient was noted to have liver mets also.   Past Medical History:  Diagnosis Date   Alcohol abuse    Alzheimer disease ([place])    Aortic stenosis 06/04/2017   a.) TTE 06/04/2017: mild; MPG 20 mmHg. b.) TTE 08/14/209: mild-mod: MPG 22 mmHg. c.) TTE 03/26/2018: mod: MPG 30 mmHg. d.) TTE 06/15/2021: severe; MPG 44.5 mmHG   Bipolar disorder (HCC)    Cardiac murmur    COPD (chronic obstructive pulmonary disease) ([place])    Demand ischemia of myocardium 01/2014   a.) positive troponins in the  setting of a fall during intoxication; b.) Myoview 2/16: EF 65-70%, no evi of ischemia or infarct. No RWMA.; c.) MV 9/16: mild ischemia in the mid anterolateral, mid anterior, basal anterolateral and basal anterior segments, patient declined LHC   Dementia ([place])    Diastolic dysfunction    a.) TTE 9/16: EF of 50-55%, G2DD, severely dilated LA, and moderate AS; b.) TTE 3/19: EF of 50-55%, G1DD, mildly dilated LA, mildly calcified MV leaflets, mild AS, mild TR, trace PR, RVSP 46 mmHg, trivial pericardial effusion. c.) TTE 03/26/2018: EF 60-65%; mild LA dilation, mod AS (MPG 30 mmHG); G2DD. d.) TTE 06/08/2021: EF 60-65%; severe AS (MPG 44.5 mmHg); G1DD.   Dyslipidemia    Essential hypertension    Gastroesophageal reflux disease    HLD (hyperlipidemia)    HOH (hard of hearing)    NSTEMI (non-ST elevated myocardial infarction) ([place]) 11/17/2014   a.) TTE with EF 50-55%; G3DD; severe dilated LA; mod AS; stress testing revealed mild ischemia in the mid anterolateral, mid anterior, basal and lateral, and basal anterior segments; LHC recommended, however patient declined opting for medical mgmt.   Prostate cancer [facility])    Sleep apnea    Stroke [facility])    Vitamin D deficiency disease  Past Surgical History:  Procedure Laterality Date   HERNIA REPAIR     KNEE SURGERY     PROSTATE BIOPSY N/A 06/27/2021   Procedure: PROSTATE BIOPSY;  Surgeon: Abbie Sons, MD;  Location: [facility] ORS;  Service: Urology;  Laterality: N/A;   TRANSRECTAL ULTRASOUND N/A 06/27/2021   Procedure: TRANSRECTAL ULTRASOUND;  Surgeon: Abbie Sons, MD;  Location: [facility] ORS;  Service: Urology;  Laterality: N/A;    Prior to Admission medications   Medication Sig Start Date End Date Taking? Authorizing Provider  acetaminophen (TYLENOL) 500 MG tablet Take 500 mg by mouth every 6 (six) hours as needed.    [provider]  ADVAIR DISKUS 250-50 MCG/ACT AEPB Inhale 1 puff into the lungs 2 (two) times daily. 05/30/21    [provider]  alum & mag hydroxide-simeth (MAALOX/MYLANTA) 200-200-20 MG/5ML suspension Take by mouth every 6 (six) hours as needed for indigestion or heartburn.    [provider]  aspirin 81 MG chewable tablet Chew 81 mg by mouth daily.    [provider]  atorvastatin (LIPITOR) 80 MG tablet Take 80 mg by mouth at bedtime. 05/18/21   [provider]  enzalutamide Gillermina Phy) 40 MG tablet Take 4 tablets (160 mg total) by mouth daily. 04/03/22   Darl Pikes, RPH-CPP  guaiFENesin (ROBAFEN) 100 MG/5ML liquid Take 5 mLs by mouth every 4 (four) hours as needed for cough or to loosen phlegm. Patient not taking: Reported on 02/13/2022    [provider]  lisinopril (ZESTRIL) 40 MG tablet Take 40 mg by mouth daily. 05/18/21   [provider]  loperamide (IMODIUM) 2 MG capsule Take by mouth as needed for diarrhea or loose stools.    [provider]  magnesium hydroxide (MILK OF MAGNESIA) 400 MG/5ML suspension Take 30 mLs by mouth daily as needed for mild constipation.    [provider]  megestrol (MEGACE) 20 MG tablet Take 20 mg by mouth daily. 06/01/21   [provider]  Neomycin-Bacitracin-Polymyxin (HCA TRIPLE ANTIBIOTIC OINTMENT EX) Apply topically.    [provider]  thiamine (VITAMIN B-1) 100 MG tablet Take 100 mg by mouth daily.    [provider]  traMADol (ULTRAM) 50 MG tablet Take by mouth every 6 (six) hours as needed. Patient not taking: Reported on 02/13/2022    [provider]    Family History  Problem Relation Age of Onset   Throat cancer Mother    Cancer Father    CAD Sister    Hypertension Sister    Diabetes Brother      Social History   Tobacco Use   Smoking status: Former    Packs/day: 0.50    Years: 57.00    Total pack years: 28.50    Types: Cigarettes    Quit date: 02/2021    Years since quitting: 1.1   Smokeless tobacco: Current    Types: Snuff  Vaping Use    Vaping Use: Former  Substance Use Topics   Alcohol use: Not Currently    Comment: He has been a heavy alcohol drinker for > 30 yrs.  Stats that he quit 3 weeks ago.    Drug use: No    Allergies as of 04/13/2022   (No Known Allergies)    Review of Systems:    All systems reviewed and negative except where noted in HPI.   Physical Exam:  Vital signs in last 24 hours: Temp:  [96.1 F (35.6 C)-96.6 F (35.9 C)]  96.6 F (35.9 C) (01/26 2025) Pulse Rate:  [65-87] 65 (01/26 2100) Resp:  [18-23] 23 (01/26 2100) BP: (70-126)/(58-103) 105/69 (01/26 2100) SpO2:  [81 %-100 %] 93 % (01/26 2100)   General:   Pleasant, cooperative in NAD Head:  Normocephalic and atraumatic. Eyes:   No icterus.   Conjunctiva pink. PERRLA. Ears:  Normal auditory acuity. Neck:  Supple; no masses or thyroidomegaly Lungs: Respirations even and unlabored. Lungs clear to auscultation bilaterally.   No wheezes, crackles, or rhonchi.  Heart:  Regular rate and rhythm;  Without murmur, clicks, rubs or gallops Abdomen:  Soft, nondistended, nontender. Normal bowel sounds. No appreciable masses or hepatomegaly.  No rebound or guarding.  Rectal:  Not performed. Msk:  Symmetrical without gross deformities.    Extremities:  Without edema, cyanosis or clubbing. Neurologic:  Alert;  grossly normal neurologically. Skin:  Intact without significant lesions or rashes. Cervical Nodes:  No significant cervical adenopathy. Psych:  Alert and confused  LAB RESULTS: Recent Labs    04/13/22 1827  WBC 15.1*  HGB 7.9*  HCT 24.7*  PLT 280   BMET Recent Labs    04/13/22 1827  NA 139  K 4.4  CL 113*  CO2 12*  GLUCOSE 132*  BUN 68*  CREATININE 2.26*  CALCIUM 7.2*   LFT Recent Labs    04/13/22 1827  PROT 4.0*  ALBUMIN 1.7*  AST 96*  ALT 28  ALKPHOS 257*  BILITOT 1.0   PT/INR Recent Labs    04/13/22 1827  LABPROT 18.2*  INR 1.5*    STUDIES: CT ABDOMEN PELVIS WO CONTRAST  Result Date:  04/13/2022 CLINICAL DATA:  Lower GI bleed. Bloody stool and hypotension. EXAM: CT ABDOMEN AND PELVIS WITHOUT CONTRAST TECHNIQUE: Multidetector CT imaging of the abdomen and pelvis was performed following the standard protocol without IV contrast. RADIATION DOSE REDUCTION: This exam was performed according to the departmental dose-optimization program which includes automated exposure control, adjustment of the mA and/or kV according to patient size and/or use of iterative reconstruction technique. COMPARISON:  Contrast-enhanced CT 10/02/2021 FINDINGS: Lower chest: Small to moderate bilateral pleural effusions with adjacent compressive atelectasis. Moderate-sized hiatal hernia. Normal heart size with coronary artery calcifications. Hepatobiliary: Heterogeneous hepatic parenchyma, multiple low-density lesions which are not well assessed in the absence of contrast and patient motion. Lesion burden may be slightly improved from prior CT, but is suboptimally assessed. Unremarkable gallbladder. Pancreas: No ductal dilatation or inflammation. Spleen: Normal in size without focal abnormality. Adrenals/Urinary Tract: No adrenal nodule. No hydronephrosis. No renal calculi. In the right kidney were better assessed on prior contrast-enhanced exam. No specific imaging follow-up is recommended. Minimally distended but thick walled urinary bladder. Stomach/Bowel: Lack of contrast, patient motion and paucity of intra-abdominal fat limits bowel assessment. Moderate-sized hiatal hernia. No bowel obstruction. No definite small bowel or colonic inflammatory change. The appendix is not well-defined on the current exam. Vascular/Lymphatic: Assessment for retroperitoneal adenopathy that was seen on prior exam is limited in the absence of contrast with patient motion. The previously enlarged retroperitoneal lymph nodes are subjectively diminished. 5 mm anterior paratracheal node series 5, image 32 previously measured 9 mm on my  retrospective measurement. No gross pelvic adenopathy. Reproductive: Patient with history of prostate cancer, grossly negative prostate. Other: Generalized edema of the intra-abdominal and subcutaneous fat suggestive of anasarca. Mild presacral soft tissue thickening. No free air or focal fluid collection. Musculoskeletal: Multifocal osseous metastatic disease with mixed areas of sclerosis and lucency. There is slight increase in some  of these expansile lesions, including right L3 transverse process lesion. Remote left pubic rami fractures which are healed, and present on prior exam. IMPRESSION: 1. No explanation for GI bleed on this noncontrast exam. 2. Patient with known metastatic prostate cancer. Multifocal osseous metastatic disease. Suggestion of progression of disease with increased expansile lesion in the right L3 transverse process. 3. Heterogeneous hepatic parenchyma with multiple low-density lesions, not well assessed in the absence of contrast and patient motion. Lesion burden may be slightly improved from prior CT, but is suboptimally assessed. 4. Assessment for retroperitoneal adenopathy that was seen on prior exam is limited in the absence of contrast and patient motion. The previously enlarged retroperitoneal lymph nodes are subjectively diminished. Recommend continued oncologic follow-up. 5. Small to moderate bilateral pleural effusions with adjacent compressive atelectasis. Generalized edema of the intra-abdominal and subcutaneous fat suggestive of anasarca. 6. Moderate-sized hiatal hernia. Electronically Signed   By: Keith Rake M.D.   On: 04/13/2022 20:29      Impression / Plan:   Assessment: Active Problems:   * No active hospital problems. *   Adam Good is a 77 y.o. y/o male with with hypertension and melanotic stools with response to hydration and blood transfusions.  The patient has metastatic prostate cancer.  The patient appears to have a upper GI bleed.  Plan:  I  have spoken to [facility] and explained the plan to put the patient to sleep and look for source of his GI bleeding.  They have agreed to proceed with the procedure.  PPI IV twice daily  Continue serial CBCs and transfuse PRN Avoid NSAIDs Maintain 2 large-bore IV lines Please page GI with any acute hemodynamic changes, or signs of active GI bleeding   Thank you for involving me in the care of this patient.      LOS: 0 days   Lucilla Lame, MD, [facility] 04/13/2022, 9:15 PM,  Pager [phone] 7am-5pm  Check AMION for 5pm -7am coverage and on weekends   Note: This dictation was prepared with Dragon dictation along with smaller phrase technology. Any transcriptional errors that result from this process are unintentional.

## 2022-04-13 NOTE — Assessment & Plan Note (Addendum)
Initially treated with PPI infusion and octreotide.  Status post upper endoscopy by GI with ulceration treated with epinephrine and cautery.  Postprocedure, no rebleeding.  Patient put on IV Protonix twice daily.  Will be discharged on Protonix 40 mg p.o. twice daily x 30 days then 40 mg p.o. daily.  Aspirin discontinued.

## 2022-04-13 NOTE — ED Provider Notes (Signed)
DSS called.  Discussed with social worker Sharren Bridge.  The patient is under [place] guardianship.  She advised that to receive consent for further procedures etc. we may call them at [phone].  I have updated them on the patient's clinical status at this point and they are understanding of the need for emergent blood product   Delman Kitten, MD 04/13/22 2012

## 2022-04-13 NOTE — ED Provider Notes (Signed)
[facility] Provider Note    Event Date/Time   First MD Initiated Contact with Patient 04/13/22 1825     (approximate)   History   GI Bleeding and Hypotension   HPI  Adam Good is a 77 y.o. male   history of prostate cancer, alcohol abuse, previous MI  EM caveat patient poor historian.  EMS reports patient had low blood pressure at his care home today, noted to have blood in his diaper.  He is reported to have fairly severe dementia.  EMS reports that staff reported no known blood thinner use  His medication administration record from his facility demonstrates no use of anticoagulant but he does take aspirin daily  Patient unable to give history, he is sitting up eyes open but does not speak.  He does look about the room.  Interacts with his hands but does not speak      Physical Exam   Triage Vital Signs: ED Triage Vitals [04/13/22 1824]  Enc Vitals Group     BP (!) 70/58     Pulse Rate 87     Resp (!) 21     Temp (!) 96.5 F (35.8 C)     Temp Source Axillary     SpO2      Weight      Height      Head Circumference      Peak Flow      Pain Score      Pain Loc      Pain Edu?      Excl. in [place]?     Most recent vital signs: Vitals:   04/13/22 1955 04/13/22 2025  BP: 106/74 (!) 86/62  Pulse: 73 67  Resp: 20 20  Temp: (!) 96.2 F (35.7 C) (!) 96.6 F (35.9 C)  SpO2: 93% 94%     General: Awake, no distress.  He appears pale though and toxic-appearing with diminished capillary refill and slight sinus the nailbeds in his hands bilaterally CV:  Normal heart tones and rate Resp:  Normal effort.  Clear bilateral Abd:  No distention.  Soft nontender nondistended Other:  Manage mild lower extremity edema bilateral.  Patient has significant very dark and grossly bloody along with dark melanic stool.   ED Results / Procedures / Treatments   Labs (all labs ordered are listed, but only abnormal results are displayed) Labs  Reviewed  COMPREHENSIVE METABOLIC PANEL - Abnormal; Notable for the following components:      Result Value   Chloride 113 (*)    CO2 12 (*)    Glucose, Bld 132 (*)    BUN 68 (*)    Creatinine, Ser 2.26 (*)    Calcium 7.2 (*)    Total Protein 4.0 (*)    Albumin 1.7 (*)    AST 96 (*)    Alkaline Phosphatase 257 (*)    GFR, Estimated 30 (*)    All other components within normal limits  CBC WITH DIFFERENTIAL/PLATELET - Abnormal; Notable for the following components:   WBC 15.1 (*)    RBC 2.53 (*)    Hemoglobin 7.9 (*)    HCT 24.7 (*)    Neutro Abs 11.9 (*)    Abs Immature Granulocytes 0.12 (*)    All other components within normal limits  PROTIME-INR - Abnormal; Notable for the following components:   Prothrombin Time 18.2 (*)    INR 1.5 (*)    All other components within normal limits  TYPE AND SCREEN  PREPARE RBC (CROSSMATCH)  ABO/RH     EKG  EKG interpreted by me at 1840 heart rate 90 QRS 90 QTc 450 Somewhat undetermined rhythm suspect normal sinus with frequent PACs.  Motion artifact there is notable T wave inversion seen in the anterior precordial leads.  No STEMI but possible ischemic abnormality.  In the setting of hypotension and GI bleeding this may be due to demand  Compared with previous EKG from June 15, 2021 T wave inversions in V2 and V3 appear to be old, but more prominent today  RADIOLOGY CT ABDOMEN PELVIS WO CONTRAST  Result Date: 04/13/2022 CLINICAL DATA:  Lower GI bleed. Bloody stool and hypotension. EXAM: CT ABDOMEN AND PELVIS WITHOUT CONTRAST TECHNIQUE: Multidetector CT imaging of the abdomen and pelvis was performed following the standard protocol without IV contrast. RADIATION DOSE REDUCTION: This exam was performed according to the departmental dose-optimization program which includes automated exposure control, adjustment of the mA and/or kV according to patient size and/or use of iterative reconstruction technique. COMPARISON:  Contrast-enhanced CT  10/02/2021 FINDINGS: Lower chest: Small to moderate bilateral pleural effusions with adjacent compressive atelectasis. Moderate-sized hiatal hernia. Normal heart size with coronary artery calcifications. Hepatobiliary: Heterogeneous hepatic parenchyma, multiple low-density lesions which are not well assessed in the absence of contrast and patient motion. Lesion burden may be slightly improved from prior CT, but is suboptimally assessed. Unremarkable gallbladder. Pancreas: No ductal dilatation or inflammation. Spleen: Normal in size without focal abnormality. Adrenals/Urinary Tract: No adrenal nodule. No hydronephrosis. No renal calculi. In the right kidney were better assessed on prior contrast-enhanced exam. No specific imaging follow-up is recommended. Minimally distended but thick walled urinary bladder. Stomach/Bowel: Lack of contrast, patient motion and paucity of intra-abdominal fat limits bowel assessment. Moderate-sized hiatal hernia. No bowel obstruction. No definite small bowel or colonic inflammatory change. The appendix is not well-defined on the current exam. Vascular/Lymphatic: Assessment for retroperitoneal adenopathy that was seen on prior exam is limited in the absence of contrast with patient motion. The previously enlarged retroperitoneal lymph nodes are subjectively diminished. 5 mm anterior paratracheal node series 5, image 32 previously measured 9 mm on my retrospective measurement. No gross pelvic adenopathy. Reproductive: Patient with history of prostate cancer, grossly negative prostate. Other: Generalized edema of the intra-abdominal and subcutaneous fat suggestive of anasarca. Mild presacral soft tissue thickening. No free air or focal fluid collection. Musculoskeletal: Multifocal osseous metastatic disease with mixed areas of sclerosis and lucency. There is slight increase in some of these expansile lesions, including right L3 transverse process lesion. Remote left pubic rami fractures  which are healed, and present on prior exam. IMPRESSION: 1. No explanation for GI bleed on this noncontrast exam. 2. Patient with known metastatic prostate cancer. Multifocal osseous metastatic disease. Suggestion of progression of disease with increased expansile lesion in the right L3 transverse process. 3. Heterogeneous hepatic parenchyma with multiple low-density lesions, not well assessed in the absence of contrast and patient motion. Lesion burden may be slightly improved from prior CT, but is suboptimally assessed. 4. Assessment for retroperitoneal adenopathy that was seen on prior exam is limited in the absence of contrast and patient motion. The previously enlarged retroperitoneal lymph nodes are subjectively diminished. Recommend continued oncologic follow-up. 5. Small to moderate bilateral pleural effusions with adjacent compressive atelectasis. Generalized edema of the intra-abdominal and subcutaneous fat suggestive of anasarca. 6. Moderate-sized hiatal hernia. Electronically Signed   By: Keith Rake M.D.   On: 04/13/2022 20:29  PROCEDURES:  Critical Care performed: Yes, see critical care procedure note(s)  CRITICAL CARE Performed by: Delman Kitten   Total critical care time: 35 minutes  Critical care time was exclusive of separately billable procedures and treating other patients.  Critical care was necessary to treat or prevent imminent or life-threatening deterioration.  Critical care was time spent personally by me on the following activities: development of treatment plan with patient and/or surrogate as well as nursing, discussions with consultants, evaluation of patient's response to treatment, examination of patient, obtaining history from patient or surrogate, ordering and performing treatments and interventions, ordering and review of laboratory studies, ordering and review of radiographic studies, pulse oximetry and re-evaluation of patient's  condition.   Procedures   MEDICATIONS ORDERED IN ED: Medications  octreotide (SANDOSTATIN) 2 mcg/mL load via infusion 50 mcg (50 mcg Intravenous Bolus from Bag 04/13/22 2046)    And  octreotide (SANDOSTATIN) 500 mcg in sodium chloride 0.9 % 250 mL (2 mcg/mL) infusion (has no administration in time range)  0.9 %  sodium chloride infusion (0 mL/hr Intravenous Paused 04/13/22 2014)  lactated ringers bolus 500 mL (0 mLs Intravenous Stopped 04/13/22 1909)  cefTRIAXone (ROCEPHIN) 2 g in sodium chloride 0.9 % 100 mL IVPB (0 g Intravenous Stopped 04/13/22 2043)  pantoprazole (PROTONIX) injection 40 mg (40 mg Intravenous Given 04/13/22 2010)     IMPRESSION / MDM / ASSESSMENT AND PLAN / ED COURSE  I reviewed the triage vital signs and the nursing notes.                             ----------------------------------------- 6:34 PM on 04/13/2022 ----------------------------------------- I have called and left a message with Department of Social Services on the emergency line requesting a call back as they are listed as the patient's guardian.  At this point though we are proceeding with presumed an emergent consent for blood product and resuscitative measures as he was last listed as full code on his prior discharge summary and is listed as a guardian with department social services whom I have contacted and left a message for an emergent call back but not yet heard back.  The patient has evidence of an obvious life-threatening illness with evidence of shock hypotension and active GI bleeding.  Differential diagnosis includes, but is not limited to, acute GI bleeding, possible diverticular bleeding, AVM, tumor mass lesion, peptic ulcer, etc.  Appears to be painless in nature though he does have alteration of his mental status and underlying dementia.  He appears to be in shock.  Initiating emergency blood.  Awaiting further workup labs, CT bleed study etc.  He is not febrile.  Patient's presentation  is most consistent with acute presentation with potential threat to life or bodily function.   The patient is on the cardiac monitor to evaluate for evidence of arrhythmia and/or significant heart rate changes.  Clinical Course as of 04/13/22 2055  Ludwig Clarks Apr 13, 2022  1958 Discussed with Dr. Allen Norris, and he is planning to come and see the patient for emergent consultation. [MQ]  1958 Patient improving, perfusion appears improved and is nailbeds, his blood pressure is markedly improved after fluid resuscitation. [MQ]    Clinical Course User Index [MQ] Delman Kitten, MD   ----------------------------------------- 8:35 PM on 04/13/2022 ----------------------------------------- Vitals:   04/13/22 1955 04/13/22 2025  BP: 106/74 (!) 86/62  Pulse: 73 67  Resp: 20 20  Temp: (!) 96.2 F (35.7 C) (!)  96.6 F (35.9 C)  SpO2: 93% 94%    Hemodynamics improved.  Patient does now have a mild oxygen requirement.  He does appear to be also edematous and may have anasarca, at risk for volume overload but at this point benefits of transfusion seem to outweigh the risks.  DSS understanding of the patient's presentation and or coordinating with Dr. Allen Norris.  Consulted with the hospitalist, anticipate admission to the hospitalist service.  Accepted to the service by Dr. Elvia Collum at 855pm.    FINAL CLINICAL IMPRESSION(S) / ED DIAGNOSES   Final diagnoses:  Gastrointestinal hemorrhage, unspecified gastrointestinal hemorrhage type  Hemorrhagic shock ([place])  AKI (acute kidney injury) ([place])  Anasarca     Rx / DC Orders   ED Discharge Orders     None        Note:  This document was prepared using Dragon voice recognition software and may include unintentional dictation errors.   Delman Kitten, MD 04/13/22 [phone]

## 2022-04-13 NOTE — Transfer of Care (Signed)
Immediate Anesthesia Transfer of Care Note  Patient: Adam Good  Procedure(s) Performed: ESOPHAGOGASTRODUODENOSCOPY (EGD)  Patient Location: Endoscopy Unit  Anesthesia Type:MAC  Level of Consciousness: awake  Airway & Oxygen Therapy: Patient Spontanous Breathing and Patient connected to nasal cannula oxygen  Post-op Assessment: Report given to RN and Post -op Vital signs reviewed and stable  Post vital signs: Reviewed  Last Vitals:  Vitals Value Taken Time  BP 145/95   Temp    Pulse 80   Resp 16   SpO2 100     Last Pain:  Vitals:   04/13/22 2135  TempSrc:   PainSc: 0-No pain         Complications: No notable events documented.

## 2022-04-14 ENCOUNTER — Inpatient Hospital Stay: Payer: Medicare Other

## 2022-04-14 LAB — BASIC METABOLIC PANEL
Anion gap: 14 (ref 5–15)
Anion gap: 14 (ref 5–15)
BUN: 71 mg/dL — ABNORMAL HIGH (ref 8–23)
BUN: 73 mg/dL — ABNORMAL HIGH (ref 8–23)
CO2: 17 mmol/L — ABNORMAL LOW (ref 22–32)
CO2: 17 mmol/L — ABNORMAL LOW (ref 22–32)
Calcium: 7.2 mg/dL — ABNORMAL LOW (ref 8.9–10.3)
Calcium: 7.3 mg/dL — ABNORMAL LOW (ref 8.9–10.3)
Chloride: 109 mmol/L (ref 98–111)
Chloride: 109 mmol/L (ref 98–111)
Creatinine, Ser: 2.25 mg/dL — ABNORMAL HIGH (ref 0.61–1.24)
Creatinine, Ser: 2.29 mg/dL — ABNORMAL HIGH (ref 0.61–1.24)
GFR, Estimated: 29 mL/min — ABNORMAL LOW (ref >60)
GFR, Estimated: 30 mL/min — ABNORMAL LOW (ref >60)
Glucose, Bld: 102 mg/dL — ABNORMAL HIGH (ref 70–99)
Glucose, Bld: 142 mg/dL — ABNORMAL HIGH (ref 70–99)
Potassium: 4.5 mmol/L (ref 3.5–5.1)
Potassium: 4.6 mmol/L (ref 3.5–5.1)
Sodium: 140 mmol/L (ref 135–145)
Sodium: 140 mmol/L (ref 135–145)

## 2022-04-14 LAB — TYPE AND SCREEN
ABO/RH(D): O POS
Antibody Screen: NEGATIVE
Unit division: 0
Unit division: 0

## 2022-04-14 LAB — URINALYSIS, W/ REFLEX TO CULTURE (INFECTION SUSPECTED)
Bilirubin Urine: NEGATIVE
Glucose, UA: NEGATIVE mg/dL
Ketones, ur: NEGATIVE mg/dL
Nitrite: NEGATIVE
Protein, ur: 30 mg/dL — AB
Specific Gravity, Urine: 1.019 (ref 1.005–1.030)
pH: 5 (ref 5.0–8.0)

## 2022-04-14 LAB — GLUCOSE, CAPILLARY
Glucose-Capillary: 62 mg/dL — ABNORMAL LOW (ref 70–99)
Glucose-Capillary: 97 mg/dL (ref 70–99)
Glucose-Capillary: 119 mg/dL — ABNORMAL HIGH (ref 70–99)
Glucose-Capillary: 105 mg/dL — ABNORMAL HIGH (ref 70–99)
Glucose-Capillary: 111 mg/dL — ABNORMAL HIGH (ref 70–99)
Glucose-Capillary: 122 mg/dL — ABNORMAL HIGH (ref 70–99)

## 2022-04-14 LAB — CBC
HCT: 34.2 % — ABNORMAL LOW (ref 39.0–52.0)
HCT: 35.3 % — ABNORMAL LOW (ref 39.0–52.0)
HCT: 39 % (ref 39.0–52.0)
Hemoglobin: 12 g/dL — ABNORMAL LOW (ref 13.0–17.0)
Hemoglobin: 12.3 g/dL — ABNORMAL LOW (ref 13.0–17.0)
Hemoglobin: 13.4 g/dL (ref 13.0–17.0)
MCH: 30.8 pg (ref 26.0–34.0)
MCH: 30.8 pg (ref 26.0–34.0)
MCH: 31.2 pg (ref 26.0–34.0)
MCHC: 34.4 g/dL (ref 30.0–36.0)
MCHC: 34.8 g/dL (ref 30.0–36.0)
MCHC: 35.1 g/dL (ref 30.0–36.0)
MCV: 87.7 fL (ref 80.0–100.0)
MCV: 88.5 fL (ref 80.0–100.0)
MCV: 90.7 fL (ref 80.0–100.0)
Platelets: 193 10*3/uL (ref 150–400)
Platelets: 193 10*3/uL (ref 150–400)
Platelets: 206 10*3/uL (ref 150–400)
RBC: 3.9 MIL/uL — ABNORMAL LOW (ref 4.22–5.81)
RBC: 3.99 MIL/uL — ABNORMAL LOW (ref 4.22–5.81)
RBC: 4.3 MIL/uL (ref 4.22–5.81)
RDW: 14.9 % (ref 11.5–15.5)
RDW: 15.1 % (ref 11.5–15.5)
RDW: 15.3 % (ref 11.5–15.5)
WBC: 12.2 10*3/uL — ABNORMAL HIGH (ref 4.0–10.5)
WBC: 13.2 10*3/uL — ABNORMAL HIGH (ref 4.0–10.5)
WBC: 14.9 10*3/uL — ABNORMAL HIGH (ref 4.0–10.5)
nRBC: 0 % (ref 0.0–0.2)
nRBC: 0 % (ref 0.0–0.2)
nRBC: 0 % (ref 0.0–0.2)

## 2022-04-14 LAB — MRSA NEXT GEN BY PCR, NASAL: MRSA by PCR Next Gen: NOT DETECTED

## 2022-04-14 LAB — TROPONIN I (HIGH SENSITIVITY)
Troponin I (High Sensitivity): 103 ng/L (ref <18)
Troponin I (High Sensitivity): 99 ng/L — ABNORMAL HIGH (ref <18)

## 2022-04-14 LAB — BPAM RBC
Blood Product Expiration Date: 202402192359
Blood Product Expiration Date: 202402192359
ISSUE DATE / TIME: 202401262219
ISSUE DATE / TIME: 202401262219
Unit Type and Rh: 5100
Unit Type and Rh: 5100

## 2022-04-14 LAB — NA AND K (SODIUM & POTASSIUM), RAND UR
Potassium Urine: 65 mmol/L
Sodium, Ur: 29 mmol/L

## 2022-04-14 LAB — APTT: aPTT: 42 seconds — ABNORMAL HIGH (ref 24–36)

## 2022-04-14 LAB — ABO/RH: ABO/RH(D): O POS

## 2022-04-14 LAB — CHLORIDE, URINE, RANDOM: Chloride Urine: 19 mmol/L

## 2022-04-14 MED ORDER — CHLORHEXIDINE GLUCONATE CLOTH 2 % EX PADS
6.0000 | MEDICATED_PAD | Freq: Every day | CUTANEOUS | Status: DC
  Administered 2022-04-14 – 2022-04-18 (×5): 6 via TOPICAL

## 2022-04-14 MED ORDER — DEXTROSE 50 % IV SOLN
INTRAVENOUS | Status: AC
  Administered 2022-04-14: 25 mL
  Filled 2022-04-14: qty 50

## 2022-04-14 MED ORDER — ORAL CARE MOUTH RINSE: 15.0000 mL | OROMUCOSAL | Status: DC | PRN

## 2022-04-14 MED ORDER — CALCIUM GLUCONATE-NACL 2-0.675 GM/100ML-% IV SOLN: 2.0000 g | Freq: Once | INTRAVENOUS | Status: DC

## 2022-04-14 MED ORDER — CALCIUM GLUCONATE-NACL 1-0.675 GM/50ML-% IV SOLN
1.0000 g | Freq: Once | INTRAVENOUS | Status: AC
  Administered 2022-04-14: 1000 mg via INTRAVENOUS
  Filled 2022-04-14: qty 50

## 2022-04-14 MED ORDER — ORAL CARE MOUTH RINSE
15.0000 mL | OROMUCOSAL | Status: DC
  Administered 2022-04-14 – 2022-04-18 (×17): 15 mL via OROMUCOSAL

## 2022-04-14 MED ORDER — SODIUM CHLORIDE 0.9 % IV SOLN
1.0000 g | INTRAVENOUS | Status: DC
  Administered 2022-04-14 – 2022-04-17 (×4): 1 g via INTRAVENOUS
  Filled 2022-04-14 (×5): qty 10

## 2022-04-14 NOTE — Progress Notes (Signed)
Patient noted to have no urine output this shift with bladder scan only revealing 246m.  Dr. SFeliberto Gottronwas notified and ordered for foley to be placed for accurate intake and output documentation.  Foley placed and patient tolerated well.

## 2022-04-14 NOTE — Progress Notes (Signed)
  Progress Note   Patient: Adam Good ZHG:[id number] DOB: November 23, 1945 DOA: 04/13/2022     1 DOS: the patient was seen and examined on 04/14/2022   Brief hospital course:  Assessment and Plan:  * Gastrointestinal hemorrhage - s/p endoscopy with intervention (cautery and injection) - IV protonix 40 mg q12  - Hgb stable    Leukocytosis - Down trending  - Check UA    Metabolic acidosis - IV D5 150 bicarb @ 100 cc/hr    Shock ([place]) - Resolved    Prostate cancer metastatic to multiple sites [facility]) - Complicating care   HLD - Lipitor 80 mg PO daily   Hypocalcemia - IV calcium 2 g   AKI - IV fluids as above   DVT prophylaxis: SCDs ordered       Subjective: Pt seen and examined at the bedside. Hgb remains stable. Continue with IV fluids to improve BUN/Cr. IV calcium 2g today.   Harlene Salts ([phone]  Physical Exam: Vitals:   04/14/22 1000 04/14/22 1100 04/14/22 1200 04/14/22 1300  BP: (!) 88/60 104/61 90/64   Pulse:    70  Resp: '17 19 16 17  '$ Temp:   97.8 F (36.6 C)   TempSrc:   Oral   SpO2: 100% 100% 100% 100%  Weight:       Physical Exam Constitutional:      Comments: Sleeping   HENT:     Head: Normocephalic.  Cardiovascular:     Rate and Rhythm: Normal rate and regular rhythm.  Pulmonary:     Effort: Pulmonary effort is normal.  Abdominal:     General: Abdomen is flat.     Palpations: Abdomen is soft.  Musculoskeletal:     Cervical back: Neck supple.  Skin:    General: Skin is warm and dry.  Neurological:     Mental Status: Mental status is at baseline.  Psychiatric:        Mood and Affect: Mood normal.      Data Reviewed:   Disposition: Status is: Inpatient  Planned Discharge Destination: Skilled nursing facility    Time spent: 35 minutes  Author: Lucienne Minks , MD 04/14/2022 1:55 PM  For on call review [email or web address].

## 2022-04-14 NOTE — Anesthesia Postprocedure Evaluation (Signed)
Anesthesia Post Note  Patient: Adam Good  Procedure(s) Performed: ESOPHAGOGASTRODUODENOSCOPY (EGD)  Patient location during evaluation: PACU Anesthesia Type: MAC Level of consciousness: awake and alert Pain management: pain level controlled Vital Signs Assessment: post-procedure vital signs reviewed and stable Respiratory status: spontaneous breathing, nonlabored ventilation, respiratory function stable and patient connected to nasal cannula oxygen Cardiovascular status: stable and blood pressure returned to baseline Postop Assessment: no apparent nausea or vomiting Anesthetic complications: no   No notable events documented.   Last Vitals:  Vitals:   04/13/22 2345 04/14/22 0000  BP: 114/74 132/70  Pulse: (!) 39   Resp: 15 19  Temp:    SpO2: 100%     Last Pain:  Vitals:   04/13/22 2237  TempSrc:   PainSc: Asleep                 Arita Miss

## 2022-04-14 NOTE — Plan of Care (Signed)
Continuing with plan of care. 

## 2022-04-15 ENCOUNTER — Inpatient Hospital Stay: Payer: Medicare Other

## 2022-04-15 DIAGNOSIS — K922 Gastrointestinal hemorrhage, unspecified: Secondary | ICD-10-CM | POA: Diagnosis not present

## 2022-04-15 LAB — COMPREHENSIVE METABOLIC PANEL
ALT: 30 U/L (ref 0–44)
AST: 96 U/L — ABNORMAL HIGH (ref 15–41)
Albumin: 1.7 g/dL — ABNORMAL LOW (ref 3.5–5.0)
Alkaline Phosphatase: 240 U/L — ABNORMAL HIGH (ref 38–126)
Anion gap: 7 (ref 5–15)
BUN: 57 mg/dL — ABNORMAL HIGH (ref 8–23)
CO2: 25 mmol/L (ref 22–32)
Calcium: 7.4 mg/dL — ABNORMAL LOW (ref 8.9–10.3)
Chloride: 108 mmol/L (ref 98–111)
Creatinine, Ser: 2 mg/dL — ABNORMAL HIGH (ref 0.61–1.24)
GFR, Estimated: 34 mL/min — ABNORMAL LOW (ref >60)
Glucose, Bld: 115 mg/dL — ABNORMAL HIGH (ref 70–99)
Potassium: 3.7 mmol/L (ref 3.5–5.1)
Sodium: 140 mmol/L (ref 135–145)
Total Bilirubin: 0.8 mg/dL (ref 0.3–1.2)
Total Protein: 3.9 g/dL — ABNORMAL LOW (ref 6.5–8.1)

## 2022-04-15 LAB — CBC
HCT: 32.5 % — ABNORMAL LOW (ref 39.0–52.0)
Hemoglobin: 11.3 g/dL — ABNORMAL LOW (ref 13.0–17.0)
MCH: 31 pg (ref 26.0–34.0)
MCHC: 34.8 g/dL (ref 30.0–36.0)
MCV: 89 fL (ref 80.0–100.0)
Platelets: 174 10*3/uL (ref 150–400)
RBC: 3.65 MIL/uL — ABNORMAL LOW (ref 4.22–5.81)
RDW: 15.3 % (ref 11.5–15.5)
WBC: 9.2 10*3/uL (ref 4.0–10.5)
nRBC: 0 % (ref 0.0–0.2)

## 2022-04-15 LAB — GLUCOSE, CAPILLARY
Glucose-Capillary: 109 mg/dL — ABNORMAL HIGH (ref 70–99)
Glucose-Capillary: 111 mg/dL — ABNORMAL HIGH (ref 70–99)
Glucose-Capillary: 110 mg/dL — ABNORMAL HIGH (ref 70–99)
Glucose-Capillary: 94 mg/dL (ref 70–99)
Glucose-Capillary: 99 mg/dL (ref 70–99)
Glucose-Capillary: 105 mg/dL — ABNORMAL HIGH (ref 70–99)

## 2022-04-15 LAB — MAGNESIUM: Magnesium: 2 mg/dL (ref 1.7–2.4)

## 2022-04-15 LAB — C-REACTIVE PROTEIN: CRP: 7 mg/dL — ABNORMAL HIGH (ref <1.0)

## 2022-04-15 LAB — PHOSPHORUS: Phosphorus: 3.4 mg/dL (ref 2.5–4.6)

## 2022-04-15 MED ORDER — DEXTROSE-NACL 5-0.45 % IV SOLN: INTRAVENOUS | Status: DC

## 2022-04-15 NOTE — Plan of Care (Signed)
Continuing with plan of care. 

## 2022-04-15 NOTE — Progress Notes (Signed)
Report given to receiving nurse Santiago Glad, RN.

## 2022-04-15 NOTE — Evaluation (Signed)
Occupational Therapy Evaluation Patient Details Name: Adam Good MRN: [id number] DOB: 06/04/1945 Today's Date: 04/15/2022   History of Present Illness Pt is a 77 year old male admitted with Gastrointestinal hemorrhage s/p endoscopy with intervention (cautery and injection); PMH significant for metastatic prostate cancer, dementia, bipolar disorder, COPD   Clinical Impression   Chart reviewed, nurse cleared pt for participation in OT evaluation. Co tx completed with PT on this date. Pt is minimally verbal, only stating "It is too early for this" but primarily nodding head yes to each question provided. Per group home discussion with PT- pt recently amb with RW until about a month ago, fell and has been staying in bed since. MD notified of group home report. Pt unable to state name, appears oriented x0. Pt presents with deficits in strength, endurance, activity tolerance, balance, cognition affecting safe and optimal ADL completion. Recommend discharge to STR to address functional deficits. OT will continue to follow acutely.      Recommendations for follow up therapy are one component of a multi-disciplinary discharge planning process, led by the attending physician.  Recommendations may be updated based on patient status, additional functional criteria and insurance authorization.   Follow Up Recommendations  Skilled nursing-short term rehab (<3 hours/day)     Assistance Recommended at Discharge Frequent or constant Supervision/Assistance  Patient can return home with the following A lot of help with walking and/or transfers;A lot of help with bathing/dressing/bathroom    Functional Status Assessment  Patient has had a recent decline in their functional status and demonstrates the ability to make significant improvements in function in a reasonable and predictable amount of time.  Equipment Recommendations  BSC/3in1;Wheelchair (measurements OT);Wheelchair cushion (measurements  OT);Hospital bed    Recommendations for Other Services       Precautions / Restrictions Precautions Precautions: Fall Restrictions Weight Bearing Restrictions: No      Mobility Bed Mobility Overal bed mobility: Needs Assistance Bed Mobility: Supine to Sit, Sit to Supine     Supine to sit: Max assist, +2 for physical assistance Sit to supine: Max assist, +2 for physical assistance   General bed mobility comments: multi modal cueing required throughout    Transfers Overall transfer level: Needs assistance Equipment used: 2 person hand held assist Transfers: Sit to/from Stand Sit to Stand: Max assist, +2 physical assistance           General transfer comment: STS 2 attempts, pt resistive, attempting to sit back down      Balance Overall balance assessment: Needs assistance Sitting-balance support: Feet supported, Bilateral upper extremity supported Sitting balance-Leahy Scale: Fair   Postural control: Posterior lean Standing balance support: Bilateral upper extremity supported Standing balance-Leahy Scale: Zero                             ADL either performed or assessed with clinical judgement   ADL Overall ADL's : Needs assistance/impaired Eating/Feeding: NPO   Grooming: Wash/dry face;Total assistance   Upper Body Bathing: Total assistance   Lower Body Bathing: Total assistance       Lower Body Dressing: Total assistance Lower Body Dressing Details (indicate cue type and reason): socks     Toileting- Clothing Manipulation and Hygiene: Total assistance         General ADL Comments: step by step multi modal cueing for any/all task participation     Vision         Perception  Praxis      Pertinent Vitals/Pain Pain Assessment Pain Assessment: PAINAD Breathing: normal Negative Vocalization: occasional moan/groan, low speech, negative/disapproving quality Facial Expression: sad, frightened, frown Body Language: tense,  distressed pacing, fidgeting Consolability: distracted or reassured by voice/touch PAINAD Score: 4 Pain Location: generalized Pain Descriptors / Indicators: Discomfort Pain Intervention(s): Monitored during session, Repositioned     Hand Dominance     Extremity/Trunk Assessment Upper Extremity Assessment Upper Extremity Assessment: Generalized weakness   Lower Extremity Assessment Lower Extremity Assessment: Generalized weakness (no noted grimacing with AROM)   Cervical / Trunk Assessment Cervical / Trunk Assessment:  (rounded shoulders, forward head)   Communication Communication Communication: Expressive difficulties;HOH   Cognition Arousal/Alertness: Awake/alert Behavior During Therapy: Flat affect Overall Cognitive Status: No family/caregiver present to determine baseline cognitive functioning Area of Impairment: Orientation, Attention, Memory, Following commands, Safety/judgement, Awareness, Problem solving                 Orientation Level: Disoriented to, Place, Time, Situation Current Attention Level: Focused Memory: Decreased short-term memory Following Commands: Follows one step commands inconsistently Safety/Judgement: Decreased awareness of deficits, Decreased awareness of safety Awareness: Intellectual Problem Solving: Slow processing, Decreased initiation, Difficulty sequencing, Requires verbal cues, Requires tactile cues General Comments: documented history of dementia, only states "it's too early for this", nods head yes to every question     General Comments  3 L via [place] throughout, vss throughout    Exercises     Shoulder Instructions      Home Living Family/patient expects to be discharged to:: Group home                                 Additional Comments: per PT who contacted facility- pt was amb with RW until approx one month ago, fell and has since remained in bed. Caregivers assisting at bed level.      Prior  Functioning/Environment               Mobility Comments: PT contacted Otila Kluver at pt's group home; she reports pt was ambulatory with RW until a month ago when he had a fall & since then pt has elected to stay in bed since. Caregivers have been assisting pt at bed level. ADLs Comments: assist for ADL/IADL from group home satff        OT Problem List: Decreased strength;Decreased activity tolerance;Impaired balance (sitting and/or standing);Decreased safety awareness;Decreased knowledge of precautions;Decreased knowledge of use of DME or AE;Decreased cognition      OT Treatment/Interventions: Self-care/ADL training;Therapeutic exercise;Energy conservation;DME and/or AE instruction;Therapeutic activities;Balance training    OT Goals(Current goals can be found in the care plan section) Acute Rehab OT Goals OT Goal Formulation: Patient unable to participate in goal setting ADL Goals Pt Will Perform Grooming: with min assist;sitting Pt Will Perform Lower Body Dressing: with min assist Pt Will Transfer to Toilet: with min assist Pt Will Perform Toileting - Clothing Manipulation and hygiene: with min assist  OT Frequency: Min 2X/week    Co-evaluation PT/OT/SLP Co-Evaluation/Treatment: Yes Reason for Co-Treatment: Complexity of the patient's impairments (multi-system involvement);Necessary to address cognition/behavior during functional activity;For patient/therapist safety;To address functional/ADL transfers PT goals addressed during session: Mobility/safety with mobility;Balance;Strengthening/ROM OT goals addressed during session: ADL's and self-care;Proper use of Adaptive equipment and DME      AM-PAC OT "6 Clicks" Daily Activity     Outcome Measure Help from another person eating meals?: Total Help from another  person taking care of personal grooming?: Total Help from another person toileting, which includes using toliet, bedpan, or urinal?: Total Help from another person bathing  (including washing, rinsing, drying)?: Total Help from another person to put on and taking off regular upper body clothing?: Total Help from another person to put on and taking off regular lower body clothing?: Total 6 Click Score: 6   End of Session Equipment Utilized During Treatment: Oxygen  Activity Tolerance: Other (comment) (limited by cognition) Patient left: in bed;with call bell/phone within reach;with bed alarm set (in chair position)  OT Visit Diagnosis: Muscle weakness (generalized) (M62.81);Unsteadiness on feet (R26.81)                Time: 8016-5537 OT Time Calculation (min): 11 min Charges:  OT General Charges $OT Visit: 1 Visit OT Evaluation $OT Eval Moderate Complexity: 1 Mod  Shanon Payor, OTD OTR/L  04/15/22, 2:19 PM

## 2022-04-15 NOTE — Progress Notes (Signed)
Patient transferred to 211 in bed on 3L [employer] and on cardiac monitoring.  Patient is in stable condition and switched over to unit cardiac monitor on arrival by receiving nurse.

## 2022-04-15 NOTE — Progress Notes (Signed)
Lucilla Lame, MD [facility]   [address], [place], [place] [address] Phone: [phone] Fax : [phone]   Subjective: This patient was admitted with an upper GI bleed with melena hypotensive and in shock.  The patient was brought to the endoscopy unit and had multiple gastric ulcers and a duodenal ulcer seen.  The patient's visible vessel in the gastric ulcer was treated with cautery and epinephrine.  The patient's hemoglobin has been stable and he has had no further sign of any melanotic stools.   Objective: Vital signs in last 24 hours: Vitals:   04/15/22 1000 04/15/22 1100 04/15/22 1200 04/15/22 1300  BP: (!) 112/55 (!) 113/55 115/63 113/73  Pulse: 67 66 62 68  Resp: '16 15 18 15  '$ Temp:   99 F (37.2 C)   TempSrc:   Oral   SpO2: 100% 100% 100% 100%  Weight:      Height:       Weight change: 0 kg  Intake/Output Summary (Last 24 hours) at 04/15/2022 1419 Last data filed at 04/15/2022 1300 Gross per 24 hour  Intake 2417.43 ml  Output 1075 ml  Net 1342.43 ml     Exam: General: Sitting in bed in no apparent distress  Lab Results: '@LABTEST2'$ @ Micro Results: Recent Results (from the past 240 hour(s))  MRSA Next Gen by PCR, Nasal     Status: None   Collection Time: 04/13/22 11:45 PM   Specimen: Nasal Mucosa; Nasal Swab  Result Value Ref Range Status   MRSA by PCR Next Gen NOT DETECTED NOT DETECTED Final    Comment: (NOTE) The GeneXpert MRSA Assay (FDA approved for NASAL specimens only), is one component of a comprehensive MRSA colonization surveillance program. It is not intended to diagnose MRSA infection nor to guide or monitor treatment for MRSA infections. Test performance is not FDA approved in patients less than 2 years old. Performed at [facility], [address] [place], [place] [address]   Urine Culture     Status: None   Collection Time: 04/14/22  4:14 PM   Specimen: Urine, Random  Result Value Ref Range Status   Specimen  Description   Final    URINE, RANDOM Performed at [facility], [address] [place], [place] [address]    Special Requests   Final    NONE Reflexed from [id number] Performed at [facility], [address] [place], [place] [address]    Culture   Final    NO GROWTH Performed at [facility], [place] [address] [place], [place] [address]    Report Status 04/15/2022 FINAL  Final   Studies/Results: US Venous Img Upper Bilat (DVT)  Result Date: 04/14/2022 CLINICAL DATA:  Bilateral lower extremity edema. Of note, the study is limited due to patient positioning, contractures and upper extremity bandages. Best possible and most comprehensive imaging obtainable. EXAM: BILATERAL UPPER EXTREMITY VENOUS DOPPLER ULTRASOUND TECHNIQUE: Gray-scale sonography with graded compression, as well as color Doppler and duplex ultrasound were performed to evaluate the bilateral upper extremity deep venous systems from the level of the subclavian vein and including the jugular, axillary, basilic, radial, ulnar and upper cephalic vein. Spectral Doppler was utilized to evaluate flow at rest and with distal augmentation maneuvers. COMPARISON:  None Available. FINDINGS: RIGHT UPPER EXTREMITY Internal Jugular Vein: No evidence of thrombus. Normal compressibility, respiratory phasicity and response to augmentation. Subclavian Vein: No evidence of thrombus. Normal compressibility, respiratory phasicity and response to augmentation. Axillary Vein:  No evidence of thrombus. Normal compressibility, respiratory phasicity and response to augmentation. Cephalic Vein: Unable to visualize. Basilic Vein: No evidence of thrombus. Normal compressibility, respiratory phasicity and response to augmentation. Brachial Veins: No evidence of thrombus. Normal compressibility, respiratory phasicity and response to augmentation. Radial Veins: No evidence of thrombus. Normal compressibility, respiratory phasicity and response to  augmentation. Ulnar Veins: No evidence of thrombus. Normal compressibility, respiratory phasicity and response to augmentation. Venous Reflux:  None. Other Findings:  None. LEFT UPPER EXTREMITY Internal Jugular Vein: No evidence of thrombus. Normal compressibility, respiratory phasicity and response to augmentation. Subclavian Vein: No evidence of thrombus. Normal compressibility, respiratory phasicity and response to augmentation. Axillary Vein: No evidence of thrombus. Normal compressibility, respiratory phasicity and response to augmentation. Cephalic Vein: Unable to visualize. Basilic Vein: Unable to visualize. Brachial Veins: No evidence of thrombus. Normal compressibility, respiratory phasicity and response to augmentation. Radial Veins: No evidence of thrombus. Normal compressibility, respiratory phasicity and response to augmentation. Ulnar Veins: Unable to visualize. Venous Reflux:  None. Other Findings:  None. IMPRESSION: 1. No evidence of DVT in either upper extremity. 2. Please see limitations above. Some vessels could not be evaluated due to patient positioning and bandages. Electronically Signed   By: Jacqulynn Cadet M.D.   On: 04/14/2022 13:03   CT ABDOMEN PELVIS WO CONTRAST  Result Date: 04/13/2022 CLINICAL DATA:  Lower GI bleed. Bloody stool and hypotension. EXAM: CT ABDOMEN AND PELVIS WITHOUT CONTRAST TECHNIQUE: Multidetector CT imaging of the abdomen and pelvis was performed following the standard protocol without IV contrast. RADIATION DOSE REDUCTION: This exam was performed according to the departmental dose-optimization program which includes automated exposure control, adjustment of the mA and/or kV according to patient size and/or use of iterative reconstruction technique. COMPARISON:  Contrast-enhanced CT 10/02/2021 FINDINGS: Lower chest: Small to moderate bilateral pleural effusions with adjacent compressive atelectasis. Moderate-sized hiatal hernia. Normal heart size with coronary  artery calcifications. Hepatobiliary: Heterogeneous hepatic parenchyma, multiple low-density lesions which are not well assessed in the absence of contrast and patient motion. Lesion burden may be slightly improved from prior CT, but is suboptimally assessed. Unremarkable gallbladder. Pancreas: No ductal dilatation or inflammation. Spleen: Normal in size without focal abnormality. Adrenals/Urinary Tract: No adrenal nodule. No hydronephrosis. No renal calculi. In the right kidney were better assessed on prior contrast-enhanced exam. No specific imaging follow-up is recommended. Minimally distended but thick walled urinary bladder. Stomach/Bowel: Lack of contrast, patient motion and paucity of intra-abdominal fat limits bowel assessment. Moderate-sized hiatal hernia. No bowel obstruction. No definite small bowel or colonic inflammatory change. The appendix is not well-defined on the current exam. Vascular/Lymphatic: Assessment for retroperitoneal adenopathy that was seen on prior exam is limited in the absence of contrast with patient motion. The previously enlarged retroperitoneal lymph nodes are subjectively diminished. 5 mm anterior paratracheal node series 5, image 32 previously measured 9 mm on my retrospective measurement. No gross pelvic adenopathy. Reproductive: Patient with history of prostate cancer, grossly negative prostate. Other: Generalized edema of the intra-abdominal and subcutaneous fat suggestive of anasarca. Mild presacral soft tissue thickening. No free air or focal fluid collection. Musculoskeletal: Multifocal osseous metastatic disease with mixed areas of sclerosis and lucency. There is slight increase in some of these expansile lesions, including right L3 transverse process lesion. Remote left pubic rami fractures which are healed, and present on prior exam. IMPRESSION: 1. No explanation for GI bleed on this noncontrast exam. 2. Patient with known metastatic prostate cancer. Multifocal osseous  metastatic disease. Suggestion of progression  of disease with increased expansile lesion in the right L3 transverse process. 3. Heterogeneous hepatic parenchyma with multiple low-density lesions, not well assessed in the absence of contrast and patient motion. Lesion burden may be slightly improved from prior CT, but is suboptimally assessed. 4. Assessment for retroperitoneal adenopathy that was seen on prior exam is limited in the absence of contrast and patient motion. The previously enlarged retroperitoneal lymph nodes are subjectively diminished. Recommend continued oncologic follow-up. 5. Small to moderate bilateral pleural effusions with adjacent compressive atelectasis. Generalized edema of the intra-abdominal and subcutaneous fat suggestive of anasarca. 6. Moderate-sized hiatal hernia. Electronically Signed   By: Keith Rake M.D.   On: 04/13/2022 20:29   Medications: I have reviewed the patient's current medications. Scheduled Meds:  atorvastatin  80 mg Oral QHS   Chlorhexidine Gluconate Cloth  6 each Topical Daily   enzalutamide  160 mg Oral Daily   fluticasone furoate-vilanterol  1 puff Inhalation Daily   megestrol  20 mg Oral Daily   mouth rinse  15 mL Mouth Rinse 4 times per day   pantoprazole (PROTONIX) IV  40 mg Intravenous Q12H   Continuous Infusions:  cefTRIAXone (ROCEPHIN)  IV Stopped (04/14/22 1834)   dextrose 5 % and 0.45% NaCl 100 mL/hr at 04/15/22 1327   PRN Meds:.acetaminophen, alum & mag hydroxide-simeth, mouth rinse   Assessment: Principal Problem:   Gastrointestinal hemorrhage Active Problems:   Prostate cancer metastatic to multiple sites [facility])   Shock ([place])   Metabolic acidosis   Leukocytosis   Duodenal ulceration    Plan: This patient was admitted with a large gastric ulcer with visible vessel that was bleeding with a secondary large gastric ulcer that was not bleeding and a duodenal ulcer that was not bleeding.  The patient has been stable and although  the hemoglobin has trended down slightly there does not seem to be any active bleeding.  If the patient should have any further GI bleeding due to the large size of the ulcers and the large visible vessel I would recommend the patient undergoing embolization with vascular surgery.    LOS: 2 days   Lewayne Bunting 04/15/2022, 2:19 PM Pager [phone] 7am-5pm  Check AMION for 5pm -7am coverage and on weekends

## 2022-04-15 NOTE — Progress Notes (Signed)
  Progress Note   Patient: Adam Good HFS:[id number] DOB: December 18, 1945 DOA: 04/13/2022     2 DOS: the patient was seen and examined on 04/15/2022   Brief hospital course:  Assessment and Plan:  * Gastrointestinal hemorrhage - s/p endoscopy with intervention (cautery and injection) - IV protonix 40 mg q12  - Monitor H&H daily  - PT/OT consulted    Leukocytosis - Resolved  - IV ceftriaxone for UTI (urine cx pending)    Metabolic acidosis - Resolved - IV D5-1/2 NS @ 100 cc/hr    Shock ([place]) - Resolved    Prostate cancer metastatic to multiple sites [facility]) - Complicating care    HLD - Lipitor 80 mg PO daily    Hypocalcemia - Corrected calcium is 9.2    AKI - IV fluids as above    DVT prophylaxis: SCDs ordered       Subjective: Pt seen and examined at the bedside. CO2 has corrected. IV fluids changed to D5-1/2 NS to help improve kidney function.  WBC is now wnl; continue IV ceftriaxone. PT/OT has been consulted.    Physical Exam: Vitals:   04/15/22 0900 04/15/22 1000 04/15/22 1100 04/15/22 1200  BP: (!) 121/57 (!) 112/55 (!) 113/55 115/63  Pulse: 63 67 66 62  Resp: '14 16 15 18  '$ Temp:    99 F (37.2 C)  TempSrc:    Oral  SpO2: 100% 100% 100% 100%  Weight:      Height:       Constitutional:      Comments: Awake, arousable to voice/touch  HENT:     Head: Normocephalic.  Cardiovascular:     Rate and Rhythm: Normal rate and regular rhythm.  Pulmonary:     Effort: Pulmonary effort is normal.  Abdominal:     General: Abdomen is flat.     Palpations: Abdomen is soft.  Musculoskeletal:     Cervical back: Neck supple.  Skin:    General: Skin is warm and dry.  Neurological:     Mental Status: Mental status is at baseline.  Psychiatric:        Mood and Affect: Mood normal.   Data Reviewed:   Disposition: Status is: Inpatient  Planned Discharge Destination: Barriers to discharge: Physical ability of the pt    Time spent: 35  minutes  Author: Lucienne Minks , MD 04/15/2022 12:18 PM  For on call review [email or web address].

## 2022-04-15 NOTE — Evaluation (Signed)
Physical Therapy Evaluation Patient Details Name: Adam Good MRN: [id number] DOB: March 22, 1945 Today's Date: 04/15/2022  History of Present Illness  Pt is a 77 y/o M admitted on 04/13/22 after presenting from group home 2/2 hypotension & bloody stool. Pt underwent endoscopy with intervention (cautery & injection). PMH: dementia, under state guardianship, prostate CA, alcohol abuse, MI, COPD, dyslipidemia, HLD, HOH, NSTEMI, stroke  Clinical Impression  Pt seen for PT evaluation with co-tx with OT for pt & therapists' safety. Prior to admission pt was residing in a group home where he was ambulatory with RW until a month ago when he fell & then elected to stay in bed since. Group home caregivers were then care giving for pt at bed level. On this date, pt is very cognitively impaired, nodding yes to all questions asked, decreased initiation & ability to follow simple commands. Pt requires +2 for bed mobility & STS attempts from EOB.  Pt does not tolerate standing more than a few seconds at a time. Recommend STR upon d/c to maximize independence with functional mobility & reduce fall risk prior to return home.   Recommendations for follow up therapy are one component of a multi-disciplinary discharge planning process, led by the attending physician.  Recommendations may be updated based on patient status, additional functional criteria and insurance authorization.  Follow Up Recommendations Skilled nursing-short term rehab (<3 hours/day) Can patient physically be transported by private vehicle: No    Assistance Recommended at Discharge Frequent or constant Supervision/Assistance  Patient can return home with the following  Two people to help with bathing/dressing/bathroom;Two people to help with walking and/or transfers;Direct supervision/assist for medications management;Help with stairs or ramp for entrance;Assist for transportation;Assistance with feeding;Assistance with cooking/housework;Direct  supervision/assist for financial management    Equipment Recommendations None recommended by PT  Recommendations for Other Services       Functional Status Assessment Patient has had a recent decline in their functional status and demonstrates the ability to make significant improvements in function in a reasonable and predictable amount of time.     Precautions / Restrictions Precautions Precautions: Fall Restrictions Weight Bearing Restrictions: No      Mobility  Bed Mobility Overal bed mobility: Needs Assistance Bed Mobility: Supine to Sit, Sit to Supine     Supine to sit: Max assist, +2 for physical assistance Sit to supine: Max assist, +2 for physical assistance   General bed mobility comments: Poor initiation of movement despite multimodal cuing.    Transfers Overall transfer level: Needs assistance Equipment used: 2 person hand held assist Transfers: Sit to/from Stand Sit to Stand: Max assist, +2 physical assistance           General transfer comment: Max assist +2 for STS from EOB with PT/OT providing cuing/facilitation for initiating. Pt does not fully shift pelvis anteriorly to achieve full upright standing posture, quickly returns to sitting EOB.    Ambulation/Gait                  Stairs            Wheelchair Mobility    Modified Rankin (Stroke Patients Only)       Balance Overall balance assessment: Needs assistance Sitting-balance support: Feet supported, Bilateral upper extremity supported Sitting balance-Leahy Scale: Fair Sitting balance - Comments: supervision static sitting EOB   Standing balance support: Bilateral upper extremity supported Standing balance-Leahy Scale: Zero  Pertinent Vitals/Pain Pain Assessment Pain Assessment: Faces Faces Pain Scale: Hurts a little bit Pain Location: generalized Pain Descriptors / Indicators: Discomfort Pain Intervention(s): Monitored  during session    Home Living Family/patient expects to be discharged to:: Group home                   Additional Comments: PT contacted Tina at pt's group home; she reports pt was ambulatory with RW until a month ago when he had a fall & since then pt has elected to stay in bed since. Caregivers have been assisting pt at bed level.    Prior Function               Mobility Comments: PT contacted Otila Kluver at pt's group home; she reports pt was ambulatory with RW until a month ago when he had a fall & since then pt has elected to stay in bed since. Caregivers have been assisting pt at bed level. ADLs Comments: Per chart, pt with hx of dementia.     Hand Dominance        Extremity/Trunk Assessment   Upper Extremity Assessment Upper Extremity Assessment: Generalized weakness    Lower Extremity Assessment Lower Extremity Assessment: Generalized weakness (3-/5 knee extension BLE in sitting)    Cervical / Trunk Assessment Cervical / Trunk Assessment:  (rounded shoulders, forward head)  Communication   Communication: HOH (per chart)  Cognition Arousal/Alertness: Awake/alert Behavior During Therapy: Flat affect Overall Cognitive Status: No family/caregiver present to determine baseline cognitive functioning                                 General Comments: Pt with documented hx of dementia, poor ability to follow even single step commands during session, nods head yes to every question asked, does not verbalize.        General Comments General comments (skin integrity, edema, etc.): Pt on 3L/min via nasal cannula throughout session, SPO2 >90%    Exercises     Assessment/Plan    PT Assessment Patient needs continued PT services  PT Problem List Decreased strength;Cardiopulmonary status limiting activity;Decreased activity tolerance;Decreased knowledge of use of DME;Decreased balance;Decreased safety awareness;Decreased mobility       PT Treatment  Interventions DME instruction;Therapeutic exercise;Balance training;Gait training;Stair training;Neuromuscular re-education;Functional mobility training;Therapeutic activities;Patient/family education    PT Goals (Current goals can be found in the Care Plan section)  Acute Rehab PT Goals PT Goal Formulation: Patient unable to participate in goal setting Time For Goal Achievement: 04/29/22 Potential to Achieve Goals: Fair    Frequency Min 2X/week     Co-evaluation PT/OT/SLP Co-Evaluation/Treatment: Yes Reason for Co-Treatment: Complexity of the patient's impairments (multi-system involvement);To address functional/ADL transfers;Necessary to address cognition/behavior during functional activity;For patient/therapist safety PT goals addressed during session: Mobility/safety with mobility;Balance;Strengthening/ROM         AM-PAC PT "6 Clicks" Mobility  Outcome Measure Help needed turning from your back to your side while in a flat bed without using bedrails?: A Lot Help needed moving from lying on your back to sitting on the side of a flat bed without using bedrails?: Total Help needed moving to and from a bed to a chair (including a wheelchair)?: Total Help needed standing up from a chair using your arms (e.g., wheelchair or bedside chair)?: Total Help needed to walk in hospital room?: Total Help needed climbing 3-5 steps with a railing? : Total 6 Click Score: 7  End of Session   Activity Tolerance: Patient tolerated treatment well (limited by impaired cognition) Patient left: in bed;with bed alarm set;with call bell/phone within reach;with SCD's reapplied (in bed in chair position) Nurse Communication: Mobility status PT Visit Diagnosis: Difficulty in walking, not elsewhere classified (R26.2);Muscle weakness (generalized) (M62.81);Unsteadiness on feet (R26.81)    Time: 1025-8527 PT Time Calculation (min) (ACUTE ONLY): 13 min   Charges:   PT Evaluation $PT Eval High  Complexity: 1 High          Lavone Nian, PT, DPT 04/15/22, 1:23 PM   Waunita Schooner 04/15/2022, 1:22 PM

## 2022-04-15 NOTE — Progress Notes (Signed)
Patient continues NPO and per Dr. Feliberto Gottron speech consult is being placed.  Patient received a bath and full linen change and tolerated well.

## 2022-04-16 ENCOUNTER — Encounter: Payer: Self-pay | Admitting: Gastroenterology

## 2022-04-16 LAB — CBC
HCT: 31.5 % — ABNORMAL LOW (ref 39.0–52.0)
Hemoglobin: 10.5 g/dL — ABNORMAL LOW (ref 13.0–17.0)
MCH: 30.6 pg (ref 26.0–34.0)
MCHC: 33.3 g/dL (ref 30.0–36.0)
MCV: 91.8 fL (ref 80.0–100.0)
Platelets: 143 10*3/uL — ABNORMAL LOW (ref 150–400)
RBC: 3.43 MIL/uL — ABNORMAL LOW (ref 4.22–5.81)
RDW: 15.2 % (ref 11.5–15.5)
WBC: 8.2 10*3/uL (ref 4.0–10.5)
nRBC: 0 % (ref 0.0–0.2)

## 2022-04-16 LAB — GLUCOSE, CAPILLARY
Glucose-Capillary: 70 mg/dL (ref 70–99)
Glucose-Capillary: 111 mg/dL — ABNORMAL HIGH (ref 70–99)
Glucose-Capillary: 122 mg/dL — ABNORMAL HIGH (ref 70–99)

## 2022-04-16 LAB — COMPREHENSIVE METABOLIC PANEL
ALT: 32 U/L (ref 0–44)
AST: 96 U/L — ABNORMAL HIGH (ref 15–41)
Albumin: 1.6 g/dL — ABNORMAL LOW (ref 3.5–5.0)
Alkaline Phosphatase: 217 U/L — ABNORMAL HIGH (ref 38–126)
Anion gap: 9 (ref 5–15)
BUN: 40 mg/dL — ABNORMAL HIGH (ref 8–23)
CO2: 26 mmol/L (ref 22–32)
Calcium: 7.6 mg/dL — ABNORMAL LOW (ref 8.9–10.3)
Chloride: 106 mmol/L (ref 98–111)
Creatinine, Ser: 1.5 mg/dL — ABNORMAL HIGH (ref 0.61–1.24)
GFR, Estimated: 48 mL/min — ABNORMAL LOW (ref >60)
Glucose, Bld: 107 mg/dL — ABNORMAL HIGH (ref 70–99)
Potassium: 3.2 mmol/L — ABNORMAL LOW (ref 3.5–5.1)
Sodium: 141 mmol/L (ref 135–145)
Total Bilirubin: 0.8 mg/dL (ref 0.3–1.2)
Total Protein: 3.7 g/dL — ABNORMAL LOW (ref 6.5–8.1)

## 2022-04-16 LAB — URINE CULTURE: Culture: NO GROWTH

## 2022-04-16 LAB — C-REACTIVE PROTEIN: CRP: 4.2 mg/dL — ABNORMAL HIGH (ref <1.0)

## 2022-04-16 LAB — PHOSPHORUS: Phosphorus: 2.9 mg/dL (ref 2.5–4.6)

## 2022-04-16 LAB — MAGNESIUM: Magnesium: 1.9 mg/dL (ref 1.7–2.4)

## 2022-04-16 MED ORDER — POTASSIUM CHLORIDE 10 MEQ/100ML IV SOLN
10.0000 meq | INTRAVENOUS | Status: AC
  Administered 2022-04-16 (×4): 10 meq via INTRAVENOUS
  Filled 2022-04-16 (×4): qty 100

## 2022-04-16 MED ORDER — POTASSIUM CHLORIDE 20 MEQ PO PACK: 40.0000 meq | PACK | Freq: Two times a day (BID) | ORAL | Status: DC

## 2022-04-16 MED ORDER — VITAMIN C 500 MG PO TABS
500.0000 mg | ORAL_TABLET | Freq: Two times a day (BID) | ORAL | Status: DC
  Administered 2022-04-16 – 2022-04-18 (×4): 500 mg via ORAL
  Filled 2022-04-16 (×4): qty 1

## 2022-04-16 MED ORDER — ENSURE ENLIVE PO LIQD
237.0000 mL | Freq: Three times a day (TID) | ORAL | Status: DC
  Administered 2022-04-17 – 2022-04-18 (×4): 237 mL via ORAL

## 2022-04-16 MED ORDER — ADULT MULTIVITAMIN W/MINERALS CH
1.0000 | ORAL_TABLET | Freq: Every day | ORAL | Status: DC
  Administered 2022-04-17 – 2022-04-18 (×2): 1 via ORAL
  Filled 2022-04-16 (×3): qty 1

## 2022-04-16 NOTE — Care Management Important Message (Signed)
Important Message  Patient Details  Name: Adam Good MRN: [id number] Date of Birth: [birth year]   Medicare Important Message Given:  N/A - LOS <3 / Initial given by admissions     Dannette Barbara 04/16/2022, 8:21 AM

## 2022-04-16 NOTE — TOC Progression Note (Signed)
Transition of Care [facility]) - Progression Note    Patient Details  Name: Adam Good MRN: [id number] Date of Birth: Dec 05, 1945  Transition of Care [facility]) CM/SW Contact  Beverly Sessions, RN Phone Number: 04/16/2022, 4:39 PM  Clinical Narrative:     Bed offers presented to [place] with DSS via secure email        Expected Discharge Plan and Services                                               Social Determinants of Health (SDOH) Interventions SDOH Screenings   Tobacco Use: High Risk (04/16/2022)    Readmission Risk Interventions    04/16/2022   11:25 AM  Readmission Risk Prevention Plan  Transportation Screening Complete  Social Work Consult for [place] Planning/Counseling Complete  Palliative Care Screening Not Applicable  Medication Review [employer]) Complete

## 2022-04-16 NOTE — Evaluation (Addendum)
Clinical/Bedside Swallow Evaluation Patient Details  Name: Adam Good MRN: [id number] Date of Birth: 03/06/1946  Today's Date: 04/16/2022 Time: SLP Start Time (ACUTE ONLY): 21 SLP Stop Time (ACUTE ONLY): 1100 SLP Time Calculation (min) (ACUTE ONLY): 45 min  Past Medical History:  Past Medical History:  Diagnosis Date   Alcohol abuse    Alzheimer disease ([place])    Aortic stenosis 06/04/2017   a.) TTE 06/04/2017: mild; MPG 20 mmHg. b.) TTE 08/14/209: mild-mod: MPG 22 mmHg. c.) TTE 03/26/2018: mod: MPG 30 mmHg. d.) TTE 06/15/2021: severe; MPG 44.5 mmHG   Bipolar disorder (HCC)    Cardiac murmur    COPD (chronic obstructive pulmonary disease) ([place])    Demand ischemia of myocardium 01/2014   a.) positive troponins in the setting of a fall during intoxication; b.) Myoview 2/16: EF 65-70%, no evi of ischemia or infarct. No RWMA.; c.) MV 9/16: mild ischemia in the mid anterolateral, mid anterior, basal anterolateral and basal anterior segments, patient declined LHC   Dementia ([place])    Diastolic dysfunction    a.) TTE 9/16: EF of 50-55%, G2DD, severely dilated LA, and moderate AS; b.) TTE 3/19: EF of 50-55%, G1DD, mildly dilated LA, mildly calcified MV leaflets, mild AS, mild TR, trace PR, RVSP 46 mmHg, trivial pericardial effusion. c.) TTE 03/26/2018: EF 60-65%; mild LA dilation, mod AS (MPG 30 mmHG); G2DD. d.) TTE 06/08/2021: EF 60-65%; severe AS (MPG 44.5 mmHg); G1DD.   Dyslipidemia    Essential hypertension    Gastroesophageal reflux disease    HLD (hyperlipidemia)    HOH (hard of hearing)    NSTEMI (non-ST elevated myocardial infarction) ([place]) 11/17/2014   a.) TTE with EF 50-55%; G3DD; severe dilated LA; mod AS; stress testing revealed mild ischemia in the mid anterolateral, mid anterior, basal and lateral, and basal anterior segments; LHC recommended, however patient declined opting for medical mgmt.   Prostate cancer ([place])    Sleep apnea    Stroke ([place])    Vitamin D deficiency  disease    Past Surgical History:  Past Surgical History:  Procedure Laterality Date   HERNIA REPAIR     KNEE SURGERY     PROSTATE BIOPSY N/A 06/27/2021   Procedure: PROSTATE BIOPSY;  Surgeon: Abbie Sons, MD;  Location: [facility] ORS;  Service: Urology;  Laterality: N/A;   TRANSRECTAL ULTRASOUND N/A 06/27/2021   Procedure: TRANSRECTAL ULTRASOUND;  Surgeon: Abbie Sons, MD;  Location: [facility] ORS;  Service: Urology;  Laterality: N/A;   HPI:  Pt is a 77 y.o. 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  Patient is not able to contribute to the history of presenting illness.  Patient is actually under the guardianship of the [place].  Per report obtained from the ER provider, patient was noted to have bloody bowel movements; GI following for GIB this admit, severe hypotension.   EGD on 04/13/22: esophagus was normal. One oozing cratered gastric ulcer with a visible vessel was found in the        gastric antrum.  Unsure if pt is living in a [place], per NSG.   CT of Abd: small to moderate bilateral pleural effusions with adjacent compressive atelectasis.  OF NOTE: Moderate-sized hiatal hernia.    Assessment / Plan / Recommendation  Clinical Impression   Pt seen for BSE today. Pt awakened w/ light verbal/tactile stim. Pt has  Baseline Dementia; Edentulous. He spoke a few words to direct questions re: self. He required min-mod cues to follow through w/ commands.  Pt on [place] O2; afebrile. WBC WNL.  Pt appears to present w/ grossly functional oropharyngeal phase swallow w/ no pharyngeal phase dysphagia noted, mild+ oral phase deficits in setting of Edentulous status. Pt consumed po trials w/ No immediate, overt clinical s/s of aspiration during po trials. Pt appears at reduced risk for aspiration following general aspiration precautions, using a  modified diet consistency for ease of mashing/gumming, and when given support at meals in setting of Dementia. OF NOTE: pt has a Baseline of MOD size Hiatal Hernia and GERD w/ recent GIB. A EGD was done this admit. ANY Dysmotility or Regurgitation of Reflux material can increase risk for aspiration of the Reflux material during Retrograde flow thus impact Voicing and Pulmonary status.  In addition, pt has the challenging factors that could impact oropharyngeal swallowing to include decondtioning/weakness, weak UE movements impacting self-feeding abilities, Edentulous status and Dementia. ANY Cognitive decline can impact timing and awareness during oral intake/swallowing thus increase risk for dysphagia, aspiration. These factors can increase risk for decreased oral intake overall.  During po trials, pt consumed all consistencies w/ No overt coughing, decline in vocal quality, or change in respiratory presentation during/post trials. O2 sats remained 98%. Oral phase appeared [facility] w/ timely bolus management and control of bolus propulsion for A-P transfer for swallowing w/ trials of puree and thin liquids via Cup. During trials of increased texture, increased Time noted for gumming/mashing the boluses. W/ Time, oral clearing achieved w/ all trial consistencies -- moistened, minced foods given.  OM Exam appeared [facility] w/ no unilateral weakness noted. Lingual bunching and sucking on oral swab noted. Speech intelligible. Pt fed self w/ setup and support -- did not use RUE to hold Cup.   Recommend a Dysphagia level 2 (MINCED foods) consistency diet w/ moistened foods; Thin liquids -- CUP RECOMMENDED ALLOWING PT TO HOLD IT when drinking(carefully monitor straw use and should not use IF coughing noted). Recommend general aspiration precautions, Pills CRUSHED in Puree for safer, easier swallowing. Reduce distractions at meals. Tray setup and support at meals -- feeding support as needed. STRICT REFLUX  PRECAUTIONS.  Education given on Pills in Puree; food consistencies and feeding support; general aspiration precautions and REFLUX precautions to pt and NSG. MD updated. NSG to reconsult if any new needs arise during this admit. NSG updated, agreed. Recommend Dietician f/u for support. Recommend a Palliative Care consult for GOC overall.  SLP Visit Diagnosis: Dysphagia, oral phase (R13.11) (in setting of Baseline Dementia and Edentulous status; GERD and Moderate Hiatal Hernia)    Aspiration Risk  Mild aspiration risk;Risk for inadequate nutrition/hydration (reduced following genral aspiration and reflux precs)    Diet Recommendation   Dysphagia level 2 (MINCED foods) consistency diet w/ moistened foods; Thin liquids -- CUP RECOMMENDED ALLOWING PT TO HOLD IT when drinking(carefully monitor straw use and should not use IF coughing noted). Recommend general aspiration precautions, reduce distractions at meals. Tray setup and support at meals -- feeding support as needed. STRICT REFLUX PRECAUTIONS.  Medication Administration: Crushed with puree    Other  Recommendations Recommended Consults: Consider GI evaluation;Consider esophageal assessment (ongoing; Dietician f/u; Palliative Care f/u for [place]) Oral Care Recommendations: Oral care BID;Oral care before and after PO;Staff/trained caregiver to provide oral care Other Recommendations:  (n/a)    Recommendations for follow up therapy are one component of a multi-disciplinary discharge planning process,  led by the attending physician.  Recommendations may be updated based on patient status, additional functional criteria and insurance authorization.  Follow up Recommendations No SLP follow up      Assistance Recommended at Discharge  full  Functional Status Assessment Patient has had a recent decline in their functional status and/or demonstrates limited ability to make significant improvements in function in a reasonable and predictable amount of  time  Frequency and Duration  (n/a)   (n/a)       Prognosis Prognosis for Safe Diet Advancement: Fair Barriers to Reach Goals: Cognitive deficits;Language deficits;Time post onset;Severity of deficits Barriers/Prognosis Comment: Baseline Dementia; GERD; MOD Hiatal Hernia; Edentulous status      Swallow Study   General Date of Onset: 04/13/22 HPI: Pt is a 77 y.o. 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  Patient is not able to contribute to the history of presenting illness.  Patient is actually under the guardianship of the [place].  Per report obtained from the ER provider, patient was noted to have bloody bowel movements; GI following for GIB this admit, severe hypotension.   EGD on 04/13/22: esophagus was normal. One oozing cratered gastric ulcer with a visible vessel was found in the        gastric antrum.  Unsure if pt is living in a [place], per NSG.   CT of Abd: small to moderate bilateral pleural effusions with adjacent compressive atelectasis.  OF NOTE: Moderate-sized hiatal hernia. Type of Study: Bedside Swallow Evaluation Previous Swallow Assessment: none Diet Prior to this Study: NPO Temperature Spikes Noted: No (wbc 8.2) Respiratory Status: Nasal cannula (3L) History of Recent Intubation: No Behavior/Cognition: Alert;Cooperative;Pleasant mood;Confused;Distractible;Requires cueing Oral Cavity Assessment: Within Functional Limits Oral Care Completed by SLP: Yes Oral Cavity - Dentition: Edentulous Vision: Functional for self-feeding Self-Feeding Abilities: Able to feed self;Needs assist;Needs set up (can hold cup to drink -- needs support w/ foods. May have RUE weakness baseline.) Patient Positioning: Upright in bed (needed support) Baseline Vocal Quality: Normal;Low vocal intensity Volitional Cough: Cognitively  unable to elicit Volitional Swallow: Unable to elicit    Oral/Motor/Sensory Function Overall Oral Motor/Sensory Function: Within functional limits   Ice Chips Ice chips: Within functional limits Presentation: Spoon (fed; 2 trials)   Thin Liquid Thin Liquid: Within functional limits Presentation: Cup;Self Fed (supported; ~10 ozs total)    Nectar Thick Nectar Thick Liquid: Not tested   Honey Thick Honey Thick Liquid: Not tested   Puree Puree: Within functional limits Presentation: Spoon (fed; 10 trials)   Solid     Solid: Impaired Presentation: Spoon (fed; 6 trials) Oral Phase Impairments: Impaired mastication (edentulous) Oral Phase Functional Implications: Impaired mastication (edentulous) Pharyngeal Phase Impairments:  (none)         Orinda Kenner, MS, CCC-SLP Speech Language Pathologist Rehab Services; [place] [phone] (ascom) Geovani Tootle 04/16/2022,11:08 AM

## 2022-04-16 NOTE — TOC Progression Note (Signed)
Transition of Care [facility]) - Progression Note    Patient Details  Name: Adam Good MRN: [id number] Date of Birth: 1945-05-03  Transition of Care [facility]) CM/SW Contact  Beverly Sessions, RN Phone Number: 04/16/2022, 1:58 PM  Clinical Narrative:     Clinical uploaded to [facility]       Expected Discharge Plan and Services                                               Social Determinants of Health (SDOH) Interventions SDOH Screenings   Tobacco Use: High Risk (03/27/2022)    Readmission Risk Interventions    04/16/2022   11:25 AM  Readmission Risk Prevention Plan  Transportation Screening Complete  Social Work Consult for [place] Planning/Counseling Complete  Palliative Care Screening Not Applicable  Medication Review Press photographer) Complete

## 2022-04-16 NOTE — Progress Notes (Signed)
RE: Adam Good   Date of Birth: June 13, 1945   Date:04/16/2022   To Whom It May Concern:   Please be advised that the above-named patient has a primary diagnosis of dementia which supersedes any psychiatric diagnosis.

## 2022-04-16 NOTE — TOC Initial Note (Signed)
Transition of Care [facility]) - Initial/Assessment Note    Patient Details  Name: Adam Good MRN: [id number] Date of Birth: January 24, 1946  Transition of Care [facility]) CM/SW Contact:    Beverly Sessions, RN Phone Number: 04/16/2022, 11:19 AM  Clinical Narrative:                  Admitted for:Gi bleed [facility] PCP: [facility]   Therapy recommending SNF.  Patient legal guardian DSS.  Spoke with Steva Ready and [name] at DSS they are both in agreement to bed search.  Fl2, 30 day note and dementia note all sent to MD for signature in order to be submitted to [facility]  Bed search initiated      Patient Goals and CMS Choice            Expected Discharge Plan and Services                                              Prior Living Arrangements/Services                       Activities of Daily Living      Permission Sought/Granted                  Emotional Assessment              Admission diagnosis:  Hemorrhagic shock ([place]) [R57.8] Anasarca [R60.1] AKI (acute kidney injury) ([place]) [N17.9] Gastrointestinal hemorrhage, unspecified gastrointestinal hemorrhage type [K92.2] GI bleeding [K92.2] Patient Active Problem List   Diagnosis Date Noted   Gastrointestinal hemorrhage 04/13/2022   Shock ([place]) 17/61/6073   Metabolic acidosis 71/08/2692   Leukocytosis 04/13/2022   Duodenal ulceration 04/13/2022   Hypernatremia 03/27/2022   Normocytic anemia 03/27/2022   Encounter for monitoring androgen deprivation therapy 01/09/2022   Weakness of right lower extremity 11/01/2021   Back pain 11/01/2021   Prostate cancer metastatic to multiple sites ([place]) 10/26/2021   Alcohol abuse with uncomplicated intoxication ([place]) 03/25/2018   Elevated troponin 10/29/2017   Dyslipidemia    Essential hypertension    Alcohol abuse    Heart murmur    Demand ischemia of myocardium ([place]) 01/17/2014   PCP:   Terrill Mohr, NP Pharmacy:   [facility], [place] [address] Phone: [phone] Fax: [place]. [place] [address] Phone: [phone] Fax: [phone]     Social Determinants of Health (SDOH) Social History: SDOH Screenings   Tobacco Use: High Risk (03/27/2022)   SDOH Interventions:     Readmission Risk Interventions     No data to display

## 2022-04-16 NOTE — Progress Notes (Signed)
RE: Adam Good Date of Birth: 08-Jun-1945 Date: 04/16/2022     To Whom It May Concern:   Please be advised that the above-named patient will require a short-term nursing home stay - anticipated 30 days or less for rehabilitation and strengthening.  The plan is for return home

## 2022-04-16 NOTE — NC FL2 (Signed)
[place] LEVEL OF CARE FORM     IDENTIFICATION  Patient Name: Adam Good Birthdate: 1945-04-02 Sex: male Admission Date (Current Location): 04/13/2022  [facility] and [place] Number:  Engineering geologist and Address:  [facility], [address] [place], [place] [address]      Provider Number: [id number]  Attending Physician Name and Address:  Lucienne Minks, MD  Relative Name and Phone Number:  Nicki Reaper Dss [phone]    Current Level of Care: SNF Recommended Level of Care: [place] Prior Approval Number:    Date Approved/Denied:   PASRR Number: Pending  Discharge Plan: SNF    Current Diagnoses: Patient Active Problem List   Diagnosis Date Noted   Gastrointestinal hemorrhage 04/13/2022   Shock ([place]) 57/84/6962   Metabolic acidosis 95/28/4132   Leukocytosis 04/13/2022   Duodenal ulceration 04/13/2022   Hypernatremia 03/27/2022   Normocytic anemia 03/27/2022   Encounter for monitoring androgen deprivation therapy 01/09/2022   Weakness of right lower extremity 11/01/2021   Back pain 11/01/2021   Prostate cancer metastatic to multiple sites [facility]) 10/26/2021   Alcohol abuse with uncomplicated intoxication ([place]) 03/25/2018   Elevated troponin 10/29/2017   Dyslipidemia    Essential hypertension    Alcohol abuse    Heart murmur    Demand ischemia of myocardium ([place]) 01/17/2014    Orientation RESPIRATION BLADDER Height & Weight     Self  O2 (3L [place]) Indwelling catheter Weight: 50.5 kg Height:  '5\' 8"'$  (172.7 cm)  BEHAVIORAL SYMPTOMS/MOOD NEUROLOGICAL BOWEL NUTRITION STATUS      Incontinent    AMBULATORY STATUS COMMUNICATION OF NEEDS Skin   Extensive Assist Verbally Skin abrasions, PU Stage and Appropriate Care                       Personal Care Assistance Level of Assistance              Functional Limitations Info             SPECIAL CARE FACTORS FREQUENCY  PT (By licensed PT),  OT (By licensed OT)                    Contractures Contractures Info: Not present    Additional Factors Info  Code Status, Allergies Code Status Info: Full Allergies Info: NKDA           Current Medications (04/16/2022):  This is the current hospital active medication list Current Facility-Administered Medications  Medication Dose Route Frequency Provider Last Rate Last Admin   acetaminophen (TYLENOL) tablet 500 mg  500 mg Oral Q6H PRN Gertie Fey, MD       alum & mag hydroxide-simeth (MAALOX/MYLANTA) 200-200-20 MG/5ML suspension 15 mL  15 mL Oral Q6H PRN Gertie Fey, MD       atorvastatin (LIPITOR) tablet 80 mg  80 mg Oral QHS Gertie Fey, MD       cefTRIAXone (ROCEPHIN) 1 g in sodium chloride 0.9 % 100 mL IVPB  1 g Intravenous Q24H Lucienne Minks, MD   Stopping previously hung infusion at 04/15/22 1820   Chlorhexidine Gluconate Cloth 2 % PADS 6 each  6 each Topical Daily Sharion Settler, NP   6 each at 04/16/22 1049   dextrose 5 %-0.45 % sodium chloride infusion   Intravenous Continuous Lucienne Minks, MD 100 mL/hr at 04/16/22 0852 New Bag at 04/16/22 0852   enzalutamide (XTANDI) tablet 160 mg  160 mg Oral Daily  Gertie Fey, MD       fluticasone furoate-vilanterol (BREO ELLIPTA) 200-25 MCG/ACT 1 puff  1 puff Inhalation Daily Gertie Fey, MD   1 puff at 04/16/22 0846   megestrol (MEGACE) tablet 20 mg  20 mg Oral Daily Gertie Fey, MD       Oral care mouth rinse  15 mL Mouth Rinse 4 times per day Sharion Settler, NP   15 mL at 04/16/22 0915   Oral care mouth rinse  15 mL Mouth Rinse PRN Sharion Settler, NP       pantoprazole (PROTONIX) injection 40 mg  40 mg Intravenous Q12H Lucilla Lame, MD   40 mg at 04/16/22 0837   potassium chloride 10 mEq in 100 mL IVPB  10 mEq Intravenous Q1 Hr x 4 Sira, Toula Moos, MD 100 mL/hr at 04/16/22 1045 10 mEq at 04/16/22 1045     Discharge Medications: Please see discharge summary for a list of discharge medications.  Relevant Imaging  Results:  Relevant Lab Results:   Additional Information ss [id number]  Beverly Sessions, RN

## 2022-04-16 NOTE — Progress Notes (Signed)
  Progress Note   Patient: Adam Good PPI:[id number] DOB: [birth year] DOA: 04/13/2022     3 DOS: the patient was seen and examined on 04/16/2022   Brief hospital course:  Assessment and Plan:  * Gastrointestinal hemorrhage - s/p endoscopy with intervention (cautery and injection) - IV protonix 40 mg q12  - Monitor H&H daily  - PT/OT consulted and they have recommended SNF   Leukocytosis - Resolved  - IV ceftriaxone for UTI (urine cx no growth so far)    Metabolic acidosis - Resolved - IV D5-1/2 NS @ 100 cc/hr    Shock ([place]) - Resolved    Prostate cancer metastatic to multiple sites [facility]) - Complicating care    HLD - Lipitor 80 mg PO daily    Hypocalcemia - Corrected calcium is wnl   AKI - Improving  - IV fluids as above    DVT prophylaxis: SCDs ordered      Subjective: Pt seen and examined at the bedside. AKI is improving with IV fluids. WBC remains wnl, continue IV ceftriaxone. FL2 signed today so that case management can work towards SNF placement.   Physical Exam: Vitals:   04/15/22 1728 04/15/22 1934 04/16/22 0348 04/16/22 0817  BP: 94/70 99/62 (!) 96/57 118/72  Pulse: 60 (!) 51 (!) 54 62  Resp: '18 16 17 16  '$ Temp: 98.8 F (37.1 C) 98.6 F (37 C) 98.8 F (37.1 C) (!) 97.5 F (36.4 C)  TempSrc:  Oral Oral   SpO2: 100% 98% 100% 100%  Weight:      Height:       HENT:     Head: Normocephalic.  Cardiovascular:     Rate and Rhythm: Normal rate and regular rhythm.  Pulmonary:     Effort: Pulmonary effort is normal.  Abdominal:     General: Abdomen is flat.     Palpations: Abdomen is soft.  Musculoskeletal:     Cervical back: Neck supple.  Skin:    General: Skin is warm and dry.  Neurological:     Mental Status: Mental status is at baseline.  Psychiatric:        Mood and Affect: Mood normal.   Data Reviewed:   Disposition: Status is: Inpatient  Planned Discharge Destination: Skilled nursing facility    Time spent: 35  minutes  Author: Lucienne Minks , MD 04/16/2022 12:19 PM  For on call review [email or web address].

## 2022-04-17 LAB — PHOSPHORUS: Phosphorus: 2.8 mg/dL (ref 2.5–4.6)

## 2022-04-17 LAB — CBC
HCT: 31.6 % — ABNORMAL LOW (ref 39.0–52.0)
Hemoglobin: 10.7 g/dL — ABNORMAL LOW (ref 13.0–17.0)
MCH: 30.9 pg (ref 26.0–34.0)
MCHC: 33.9 g/dL (ref 30.0–36.0)
MCV: 91.3 fL (ref 80.0–100.0)
Platelets: 138 10*3/uL — ABNORMAL LOW (ref 150–400)
RBC: 3.46 MIL/uL — ABNORMAL LOW (ref 4.22–5.81)
RDW: 14.8 % (ref 11.5–15.5)
WBC: 7.3 10*3/uL (ref 4.0–10.5)
nRBC: 0.4 % — ABNORMAL HIGH (ref 0.0–0.2)

## 2022-04-17 LAB — COMPREHENSIVE METABOLIC PANEL
ALT: 36 U/L (ref 0–44)
AST: 99 U/L — ABNORMAL HIGH (ref 15–41)
Albumin: 1.6 g/dL — ABNORMAL LOW (ref 3.5–5.0)
Alkaline Phosphatase: 218 U/L — ABNORMAL HIGH (ref 38–126)
Anion gap: 4 — ABNORMAL LOW (ref 5–15)
BUN: 30 mg/dL — ABNORMAL HIGH (ref 8–23)
CO2: 24 mmol/L (ref 22–32)
Calcium: 7.1 mg/dL — ABNORMAL LOW (ref 8.9–10.3)
Chloride: 105 mmol/L (ref 98–111)
Creatinine, Ser: 1.06 mg/dL (ref 0.61–1.24)
GFR, Estimated: >60 mL/min (ref >60)
Glucose, Bld: 100 mg/dL — ABNORMAL HIGH (ref 70–99)
Potassium: 3.2 mmol/L — ABNORMAL LOW (ref 3.5–5.1)
Sodium: 133 mmol/L — ABNORMAL LOW (ref 135–145)
Total Bilirubin: 0.7 mg/dL (ref 0.3–1.2)
Total Protein: 3.7 g/dL — ABNORMAL LOW (ref 6.5–8.1)

## 2022-04-17 LAB — MAGNESIUM: Magnesium: 1.8 mg/dL (ref 1.7–2.4)

## 2022-04-17 LAB — C-REACTIVE PROTEIN: CRP: 3 mg/dL — ABNORMAL HIGH (ref <1.0)

## 2022-04-17 MED ORDER — MAGNESIUM SULFATE 2 GM/50ML IV SOLN
2.0000 g | Freq: Once | INTRAVENOUS | Status: AC
  Administered 2022-04-17: 2 g via INTRAVENOUS
  Filled 2022-04-17: qty 50

## 2022-04-17 MED ORDER — POTASSIUM CHLORIDE 10 MEQ/100ML IV SOLN
10.0000 meq | INTRAVENOUS | Status: AC
  Administered 2022-04-17 (×4): 10 meq via INTRAVENOUS
  Filled 2022-04-17 (×3): qty 100

## 2022-04-17 NOTE — TOC Progression Note (Signed)
Transition of Care [facility]) - Progression Note    Patient Details  Name: Adam Good MRN: [id number] Date of Birth: [birth year]  Transition of Care [facility]) CM/SW Contact  Beverly Sessions, RN Phone Number: 04/17/2022, 10:58 AM  Clinical Narrative:     [name] with DSS accepts bed at [facility] .  Accepted in [place], and notified Tammy at [facility].  Adam Good [id number]       Expected Discharge Plan and Services                                               Social Determinants of Health (SDOH) Interventions SDOH Screenings   Tobacco Use: High Risk (04/16/2022)    Readmission Risk Interventions    04/16/2022   11:25 AM  Readmission Risk Prevention Plan  Transportation Screening Complete  Social Work Consult for [place] Planning/Counseling Complete  Palliative Care Screening Not Applicable  Medication Review Press photographer) Complete

## 2022-04-17 NOTE — Progress Notes (Signed)
  Progress Note   Patient: Adam Good OZD:[id number] DOB: 1945/09/03 DOA: 04/13/2022     4 DOS: the patient was seen and examined on 04/17/2022   Brief hospital course:  Assessment and Plan:  * Gastrointestinal hemorrhage - s/p endoscopy with intervention (cautery and injection) - IV protonix 40 mg q12  - H&H stable  - PT/OT consulted and they have recommended SNF --> case management working on this    Leukocytosis - Resolved  - IV ceftriaxone for UTI (urine cx no growth so far)    Metabolic acidosis - Resolved   Shock ([place]) - Resolved    Prostate cancer metastatic to multiple sites [facility]) - Complicating care    HLD - Lipitor 80 mg PO daily    Hypocalcemia - Corrected calcium is wnl   AKI - Resolved    DVT prophylaxis: SCDs ordered      Subjective: Pt seen and examined at the bedside. Pt awaits placement per the case manager [facility] signed yesterday). Continue with IV ceftriaxone.  Physical Exam: Vitals:   04/16/22 1656 04/16/22 1933 04/17/22 0350 04/17/22 0803  BP: 120/74 110/70 93/65 109/66  Pulse: 60 (!) 59 (!) 53 73  Resp: '16 18 17 16  '$ Temp: 97.8 F (36.6 C) 98 F (36.7 C) 97.9 F (36.6 C) 97.8 F (36.6 C)  TempSrc: Oral Oral Oral Axillary  SpO2: 100% 100% 100% 100%  Weight:      Height:       HENT:     Head: Normocephalic.  Cardiovascular:     Rate and Rhythm: Normal rate and regular rhythm.  Pulmonary:     Effort: Pulmonary effort is normal.  Abdominal:     General: Abdomen is flat.     Palpations: Abdomen is soft.  Musculoskeletal:     Cervical back: Neck supple.  Skin:    General: Skin is warm and dry.  Neurological:     Mental Status: Mental status is at baseline.  Psychiatric:        Mood and Affect: Mood normal.   Data Reviewed:   Disposition: Status is: Inpatient  Planned Discharge Destination: Rehab    Time spent: 35 minutes  Author: Lucienne Minks , MD 04/17/2022 12:22 PM  For on call review [email or web address].

## 2022-04-18 DIAGNOSIS — N390 Urinary tract infection, site not specified: Secondary | ICD-10-CM | POA: Diagnosis not present

## 2022-04-18 DIAGNOSIS — L8990 Pressure ulcer of unspecified site, unspecified stage: Secondary | ICD-10-CM | POA: Diagnosis present

## 2022-04-18 DIAGNOSIS — F0390 Unspecified dementia without behavioral disturbance: Secondary | ICD-10-CM

## 2022-04-18 DIAGNOSIS — R571 Hypovolemic shock: Secondary | ICD-10-CM

## 2022-04-18 DIAGNOSIS — K922 Gastrointestinal hemorrhage, unspecified: Secondary | ICD-10-CM | POA: Diagnosis not present

## 2022-04-18 LAB — COMPREHENSIVE METABOLIC PANEL
ALT: 38 U/L (ref 0–44)
AST: 97 U/L — ABNORMAL HIGH (ref 15–41)
Albumin: 1.6 g/dL — ABNORMAL LOW (ref 3.5–5.0)
Alkaline Phosphatase: 228 U/L — ABNORMAL HIGH (ref 38–126)
Anion gap: 7 (ref 5–15)
BUN: 27 mg/dL — ABNORMAL HIGH (ref 8–23)
CO2: 21 mmol/L — ABNORMAL LOW (ref 22–32)
Calcium: 7.5 mg/dL — ABNORMAL LOW (ref 8.9–10.3)
Chloride: 106 mmol/L (ref 98–111)
Creatinine, Ser: 0.97 mg/dL (ref 0.61–1.24)
GFR, Estimated: >60 mL/min (ref >60)
Glucose, Bld: 85 mg/dL (ref 70–99)
Potassium: 4.1 mmol/L (ref 3.5–5.1)
Sodium: 134 mmol/L — ABNORMAL LOW (ref 135–145)
Total Bilirubin: 0.7 mg/dL (ref 0.3–1.2)
Total Protein: 3.7 g/dL — ABNORMAL LOW (ref 6.5–8.1)

## 2022-04-18 LAB — CBC
HCT: 34.6 % — ABNORMAL LOW (ref 39.0–52.0)
Hemoglobin: 11.5 g/dL — ABNORMAL LOW (ref 13.0–17.0)
MCH: 30.8 pg (ref 26.0–34.0)
MCHC: 33.2 g/dL (ref 30.0–36.0)
MCV: 92.8 fL (ref 80.0–100.0)
Platelets: 133 10*3/uL — ABNORMAL LOW (ref 150–400)
RBC: 3.73 MIL/uL — ABNORMAL LOW (ref 4.22–5.81)
RDW: 14.7 % (ref 11.5–15.5)
WBC: 8.4 10*3/uL (ref 4.0–10.5)
nRBC: 0 % (ref 0.0–0.2)

## 2022-04-18 LAB — MAGNESIUM: Magnesium: 2 mg/dL (ref 1.7–2.4)

## 2022-04-18 LAB — C-REACTIVE PROTEIN: CRP: 2.4 mg/dL — ABNORMAL HIGH (ref <1.0)

## 2022-04-18 LAB — PHOSPHORUS: Phosphorus: 2.2 mg/dL — ABNORMAL LOW (ref 2.5–4.6)

## 2022-04-18 MED ORDER — PANTOPRAZOLE SODIUM 40 MG PO TBEC: DELAYED_RELEASE_TABLET | ORAL | 0 refills | Status: AC

## 2022-04-18 MED ORDER — ADULT MULTIVITAMIN W/MINERALS CH: 1.0000 | ORAL_TABLET | Freq: Every day | ORAL | Status: DC

## 2022-04-18 MED ORDER — ENSURE ENLIVE PO LIQD: 237.0000 mL | Freq: Three times a day (TID) | ORAL | 12 refills | Status: DC

## 2022-04-18 NOTE — Plan of Care (Signed)

## 2022-04-18 NOTE — Assessment & Plan Note (Signed)
Staying at baseline

## 2022-04-18 NOTE — Progress Notes (Addendum)
Speech Language Pathology Treatment: Dysphagia  Patient Details Name: Adam Good MRN: [id number] DOB: Aug 30, 1945 Today's Date: 04/18/2022 Time: 0900-0930 SLP Time Calculation (min) (ACUTE ONLY): 30 min  Assessment / Plan / Recommendation Clinical Impression  Pt seen today for Dysphagia treatment. Pt asleep in bed upon arrival. Pt appeared alert, cooperative, and mildly confused throughout session -- pt has baseline cognitive decline from Dementia. Pt is HOH. Pt left sitting up in bed w/ call button in reach and bed alarm set. Pt completed all tasks w/ min verbal/visual/tactile cues. Pt on RA; afebrile; WBC WNL.  Pt appears to present w/ grossly functional oropharyngeal phase swallow w/ no immediate, overt pharyngeal phase dysphagia noted but mild oral phase deficits in setting of edentulous status and Dementia. Pt consumed trials w/ no immediate, overt clinical s/s of aspiration during po trials directly related to swallowing. Pt appears at reduced risk for aspiration following general aspiration precautions, using a modified diet for ease of mashing/gumming, and when given support/cues at meals in setting of Dementia.   In addition, pt has the challenging factors that could impact oropharyngeal swallowing to include decondtioning/weakness, weak UE movements impacting self-feeding abilities, Edentulous status and Dementia. ANY Cognitive decline can impact timing and awareness during oral intake/swallowing thus increase risk for dysphagia, aspiration. These factors can increase risk for decreased oral intake overall.   Pt consumed trials of Dys 2 soft foods (x10+), puree (x10+), and thin liquids (~8oz). Pt consumed all po's w/o any overt, immediate coughing, wet vocal quality, or change in respiratory presentation during/post trials. Oral phase appeared grossly [facility] for pt in setting of edentuous status and cognitive deficits. Pt ate slowly and required min support from SLP to break apart large  pieces of eggs. Noted pt required min/mod verbal/tactile cues to attend to one task at a time (I.e. Putting cup down while eating eggs, using spoon to eat puree versus cup drinking). Cues were helpful in redirecting his attention to task at hand and oral clearing. Noted inconsistent min/mod diffused oral residue b/t bites. Alternating b/t Dys 2 foods and purees/liquids were helpful in clearing oral cavity. Again, cues were necessary.  Pt indicated "full" feelings towards end of meals expectorating egg bolus when did not want anything further. Encourage pt to stop eating and take break when experiencing "full" feelings in setting of his baseline esophageal phase dysmotility and GERD. Pt agreed.  Recommend continue Dysphagia level 2 (MINCED foods) consistency diet w/ moistened foods; Thin liquids -- CUP RECOMMENDED ALLOWING PT TO HOLD IT when drinking -- cup drinking strongly recommended in pt's situation. Recommend general aspiration precautions, Pills CRUSHED in Puree for safer, easier swallowing. Reduce distractions at meals. Recommend intermittent supervision during meals to provide cues as needed in setting of Dementia -- monitor oral clearing during meals. Tray setup and support at meals -- feeding support as needed. STRICT REFLUX PRECAUTIONS.   Education given on food consistencies, feeding support, alternating b/t food and liquid to clear oral cavity; general aspiration precautions (sitting upright, eating slowly, small sips/bites) and REFLUX precautions to pt.   No further acute skilled ST needs at this time. ST to sign off. MD to reconsult if any need needs arise during admission. Nursing updated. Pt agreed.   HPI HPI: Pt is a 77 y.o. 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  Patient is not able to contribute to the  history of presenting  illness.  Patient is actually under the guardianship of the [place].  Per report obtained from the ER provider, patient was noted to have bloody bowel movements; GI following for GIB this admit, severe hypotension.   EGD on 04/13/22: esophagus was normal. One oozing cratered gastric ulcer with a visible vessel was found in the        gastric antrum.  Unsure if pt is living in a [place], per NSG.   CT of Abd: small to moderate bilateral pleural effusions with adjacent compressive atelectasis.  OF NOTE: Moderate-sized hiatal hernia.      SLP Plan  All goals met      Recommendations for follow up therapy are one component of a multi-disciplinary discharge planning process, led by the attending physician.  Recommendations may be updated based on patient status, additional functional criteria and insurance authorization.    Recommendations  Diet recommendations: Dysphagia 2 (fine chop);Thin liquid Liquids provided via: Cup Medication Administration: Crushed with puree Supervision: Patient able to self feed;Intermittent supervision to cue for compensatory strategies Compensations: Minimize environmental distractions;Slow rate;Small sips/bites;Follow solids with liquid Postural Changes and/or Swallow Maneuvers: Seated upright 90 degrees;Upright 30-60 min after meal                Oral Care Recommendations: Oral care BID;Oral care before and after PO;Staff/trained caregiver to provide oral care Follow Up Recommendations: No SLP follow up Assistance recommended at discharge: Frequent or constant Supervision/Assistance SLP Visit Diagnosis: Dysphagia, oral phase (R13.11) Plan: All goals met          Randall Hiss Graduate Clinician [place], Speech Pathology  Randall Hiss 04/18/2022, 10:00 AM    The information in this patient note, response to treatment, and overall treatment plan developed has been reviewed and agreed upon after reviewing documentation. This session was performed  under the supervision of this licensed clinician.   Orinda Kenner, [place], [place] [phone] 04/18/2022; 11:06am

## 2022-04-18 NOTE — Assessment & Plan Note (Signed)
Pressure Injury 04/13/22 Thigh Anterior;Right;Medial Stage 2 -  Partial thickness loss of dermis presenting as a shallow open injury with a red, pink wound bed without slough. 3 cm X 3 cm   round pressure injury on right hip (Active)  04/13/22 2330  Location: Thigh  Location Orientation: Anterior;Right;Medial  Staging: Stage 2 -  Partial thickness loss of dermis presenting as a shallow open injury with a red, pink wound bed without slough.  Wound Description (Comments): 3 cm X 3 cm   round pressure injury on right hip  Present on Admission: Yes

## 2022-04-18 NOTE — Discharge Summary (Signed)
Physician Discharge Summary   Patient: Adam Good MRN: [id number] DOB: 17-Nov-1945  Admit date:     04/13/2022  Discharge date: 04/18/22  Discharge Physician: Annita Brod   PCP: Terrill Mohr, NP   Recommendations at discharge:   New medication: Protonix 40 mg p.o. twice daily x 30 days, then 30 mg p.o. daily Medication change: Aspirin discontinued Medication change: Lisinopril 20 mg daily discontinued due to low blood pressures.  If blood patient's blood pressure starts to rise, lisinopril can be titrated back up starting at 5 mg Diet: Dysphagia 2 diet with thin liquids and Ensure supplements 3 times daily between meals New medication: Multivitamin p.o. daily Patient being discharged to skilled nursing  Discharge Diagnoses: Active Problems:   Prostate cancer metastatic to multiple sites [facility])   Essential hypertension   Senile dementia uncomp, without behavioral disturbance (HCC)   Pressure injury of skin  Principal Problem (Resolved):   Hypovolemic shock (HCC) Resolved Problems:   Acute upper GI bleed secondary to gastric and duodenal ulcers   UTI (urinary tract infection)  Hospital Course: 77 year old male with past medical history of Alzheimer's dementia, bipolar disorder, COPD, chronic diastolic heart failure and prostate cancer who presented to the emergency room on 1/26 after he was noted by his caregivers that patient had bloody bowel movements.  Assessment and Plan: * Hypovolemic shock (HCC)-resolved as of 04/18/2022 Secondary to GI bleed.  Treated with IV fluids and blood.  Resolved.  Acute upper GI bleed secondary to gastric and duodenal ulcers-resolved as of 04/18/2022 Initially treated with PPI infusion and octreotide.  Status post upper endoscopy by GI with ulceration treated with epinephrine and cautery.  Postprocedure, no rebleeding.  Patient put on IV Protonix twice daily.  Will be discharged on Protonix 40 mg p.o. twice daily x 30 days then 40 mg p.o.  daily.  Aspirin discontinued.    UTI (urinary tract infection)-resolved as of 04/18/2022 Noted to have leukocytosis and UTI on admission.  Urine culture not done.  Received IV Rocephin x 3 days.  Prostate cancer metastatic to multiple sites [facility]) Continue with chronic antiandrogen therapy.   Essential hypertension On lisinopril.  Blood pressure medicines initially held due to shock.  As shock stabilized, blood pressure still remained low without medication.  Recommended discontinuing lisinopril and should blood pressure start to rise, lisinopril can be titrated back up.  Senile dementia uncomp, without behavioral disturbance ([name]) Staying at baseline  Pressure injury of skin Pressure Injury 04/13/22 Thigh Anterior;Right;Medial Stage 2 -  Partial thickness loss of dermis presenting as a shallow open injury with a red, pink wound bed without slough. 3 cm X 3 cm   round pressure injury on right hip (Active)  04/13/22 2330  Location: Thigh  Location Orientation: Anterior;Right;Medial  Staging: Stage 2 -  Partial thickness loss of dermis presenting as a shallow open injury with a red, pink wound bed without slough.  Wound Description (Comments): 3 cm X 3 cm   round pressure injury on right hip  Present on Admission: Yes      Metabolic acidosis Uncertain etiology, check urine sodium potassium and chloride as well as urine pH.  Start sodium bicarbonate infusion         Consultants: Gastroenterology Procedures performed:  -2 unit packed red blood cell transfusion -Status post EGD noting multiple gastric ulcers and duodenal ulcer with visible vessel from gastric ulcer treated with cautery and epinephrine  Disposition: Skilled nursing facility Diet recommendation:  Discharge Diet Orders (From admission,  onward)     Start     Ordered   04/18/22 0000  DIET DYS 2       Question:  Fluid consistency:  Answer:  Thin   04/18/22 0958           Dysphagia 2 diet with thin liquids and  Ensure shakes supplement 3 times daily between meals  DISCHARGE MEDICATION: Allergies as of 04/18/2022   No Known Allergies      Medication List     STOP taking these medications    aspirin 81 MG chewable tablet   [facility] TRIPLE ANTIBIOTIC OINTMENT EX   lisinopril 20 MG tablet Commonly known as: ZESTRIL       TAKE these medications    acetaminophen 500 MG tablet Commonly known as: TYLENOL Take 500 mg by mouth every 6 (six) hours as needed.   Advair Diskus 250-50 MCG/ACT Aepb Generic drug: fluticasone-salmeterol Inhale 1 puff into the lungs 2 (two) times daily.   alum & mag hydroxide-simeth 200-200-20 MG/5ML suspension Commonly known as: MAALOX/MYLANTA Take by mouth every 6 (six) hours as needed for indigestion or heartburn.   atorvastatin 80 MG tablet Commonly known as: LIPITOR Take 80 mg by mouth at bedtime.   feeding supplement Liqd Take 237 mLs by mouth 3 (three) times daily between meals.   loperamide 2 MG capsule Commonly known as: IMODIUM Take by mouth as needed for diarrhea or loose stools.   magnesium hydroxide 400 MG/5ML suspension Commonly known as: MILK OF MAGNESIA Take 30 mLs by mouth daily as needed for mild constipation.   megestrol 20 MG tablet Commonly known as: MEGACE Take 20 mg by mouth daily.   multivitamin with minerals Tabs tablet Take 1 tablet by mouth daily. Start taking on: April 19, 2022   pantoprazole 40 MG tablet Commonly known as: Protonix Take 1 tablet (40 mg total) by mouth 2 (two) times daily for 30 days, THEN 1 tablet (40 mg total) daily. Start taking on: April 18, 2022   thiamine 100 MG tablet Commonly known as: Vitamin B-1 Take 100 mg by mouth daily.   Xtandi 40 MG tablet Generic drug: enzalutamide Take 4 tablets (160 mg total) by mouth daily.        Contact information for after-discharge care     Destination     [facility] Preferred SNF .   Service: Skilled Nursing Contact  information: [address] [place] [phone]                    Discharge Exam: Danley Danker Weights   04/13/22 2323 04/14/22 1200  Weight: 50.5 kg 50.5 kg   General: Current dementia Cardiovascular: Regular rate and rhythm, S1-S2  Condition at discharge: fair  The results of significant diagnostics from this hospitalization (including imaging, microbiology, ancillary and laboratory) are listed below for reference.   Imaging Studies: DG HIP UNILAT WITH PELVIS 2-3 VIEWS RIGHT  Result Date: 04/15/2022 CLINICAL DATA:  Bilateral hip pain. No known injury. Prostate cancer. EXAM: DG HIP (WITH OR WITHOUT PELVIS) 2-3V RIGHT COMPARISON:  Pelvis and left hip radiographs obtained at the same time. Abdomen and pelvis CT dated 04/13/2022. Nuclear medicine bone scan dated 09/26/2021. FINDINGS: Normal appearing right hip. Multiple areas of patchy sclerosis in both iliac bones. Old, healed left pubic fractures. Focal sclerosis in the region of the healed superior pubic ramus/body fracture. IMPRESSION: 1. Normal appearing right hip. 2. Previously noted multifocal sclerotic bony metastatic disease compatible with  metastatic prostate cancer. Electronically Signed   By: Claudie Revering M.D.   On: 04/15/2022 15:01   DG HIP UNILAT WITH PELVIS 2-3 VIEWS LEFT  Result Date: 04/15/2022 CLINICAL DATA:  Bilateral hip pain. No known injury. Prostate cancer. EXAM: DG HIP (WITH OR WITHOUT PELVIS) 2-3V LEFT COMPARISON:  Abdomen and pelvis CT dated 04/13/2022. FINDINGS: Normal-appearing hips. Old left pubic fractures with no acute fracture seen. Patchy increased density in the lateral aspects of both iliac bones compatible with multifocal bony metastatic disease on the recent CT and previous nuclear medicine bone scan. IMPRESSION: No acute abnormality. Electronically Signed   By: Claudie Revering M.D.   On: 04/15/2022 14:58   US Venous Img Upper Bilat (DVT)  Result Date: 04/14/2022 CLINICAL DATA:   Bilateral lower extremity edema. Of note, the study is limited due to patient positioning, contractures and upper extremity bandages. Best possible and most comprehensive imaging obtainable. EXAM: BILATERAL UPPER EXTREMITY VENOUS DOPPLER ULTRASOUND TECHNIQUE: Gray-scale sonography with graded compression, as well as color Doppler and duplex ultrasound were performed to evaluate the bilateral upper extremity deep venous systems from the level of the subclavian vein and including the jugular, axillary, basilic, radial, ulnar and upper cephalic vein. Spectral Doppler was utilized to evaluate flow at rest and with distal augmentation maneuvers. COMPARISON:  None Available. FINDINGS: RIGHT UPPER EXTREMITY Internal Jugular Vein: No evidence of thrombus. Normal compressibility, respiratory phasicity and response to augmentation. Subclavian Vein: No evidence of thrombus. Normal compressibility, respiratory phasicity and response to augmentation. Axillary Vein: No evidence of thrombus. Normal compressibility, respiratory phasicity and response to augmentation. Cephalic Vein: Unable to visualize. Basilic Vein: No evidence of thrombus. Normal compressibility, respiratory phasicity and response to augmentation. Brachial Veins: No evidence of thrombus. Normal compressibility, respiratory phasicity and response to augmentation. Radial Veins: No evidence of thrombus. Normal compressibility, respiratory phasicity and response to augmentation. Ulnar Veins: No evidence of thrombus. Normal compressibility, respiratory phasicity and response to augmentation. Venous Reflux:  None. Other Findings:  None. LEFT UPPER EXTREMITY Internal Jugular Vein: No evidence of thrombus. Normal compressibility, respiratory phasicity and response to augmentation. Subclavian Vein: No evidence of thrombus. Normal compressibility, respiratory phasicity and response to augmentation. Axillary Vein: No evidence of thrombus. Normal compressibility, respiratory  phasicity and response to augmentation. Cephalic Vein: Unable to visualize. Basilic Vein: Unable to visualize. Brachial Veins: No evidence of thrombus. Normal compressibility, respiratory phasicity and response to augmentation. Radial Veins: No evidence of thrombus. Normal compressibility, respiratory phasicity and response to augmentation. Ulnar Veins: Unable to visualize. Venous Reflux:  None. Other Findings:  None. IMPRESSION: 1. No evidence of DVT in either upper extremity. 2. Please see limitations above. Some vessels could not be evaluated due to patient positioning and bandages. Electronically Signed   By: Jacqulynn Cadet M.D.   On: 04/14/2022 13:03   CT ABDOMEN PELVIS WO CONTRAST  Result Date: 04/13/2022 CLINICAL DATA:  Lower GI bleed. Bloody stool and hypotension. EXAM: CT ABDOMEN AND PELVIS WITHOUT CONTRAST TECHNIQUE: Multidetector CT imaging of the abdomen and pelvis was performed following the standard protocol without IV contrast. RADIATION DOSE REDUCTION: This exam was performed according to the departmental dose-optimization program which includes automated exposure control, adjustment of the mA and/or kV according to patient size and/or use of iterative reconstruction technique. COMPARISON:  Contrast-enhanced CT 10/02/2021 FINDINGS: Lower chest: Small to moderate bilateral pleural effusions with adjacent compressive atelectasis. Moderate-sized hiatal hernia. Normal heart size with coronary artery calcifications. Hepatobiliary: Heterogeneous hepatic parenchyma, multiple low-density lesions which are not  well assessed in the absence of contrast and patient motion. Lesion burden may be slightly improved from prior CT, but is suboptimally assessed. Unremarkable gallbladder. Pancreas: No ductal dilatation or inflammation. Spleen: Normal in size without focal abnormality. Adrenals/Urinary Tract: No adrenal nodule. No hydronephrosis. No renal calculi. In the right kidney were better assessed on prior  contrast-enhanced exam. No specific imaging follow-up is recommended. Minimally distended but thick walled urinary bladder. Stomach/Bowel: Lack of contrast, patient motion and paucity of intra-abdominal fat limits bowel assessment. Moderate-sized hiatal hernia. No bowel obstruction. No definite small bowel or colonic inflammatory change. The appendix is not well-defined on the current exam. Vascular/Lymphatic: Assessment for retroperitoneal adenopathy that was seen on prior exam is limited in the absence of contrast with patient motion. The previously enlarged retroperitoneal lymph nodes are subjectively diminished. 5 mm anterior paratracheal node series 5, image 32 previously measured 9 mm on my retrospective measurement. No gross pelvic adenopathy. Reproductive: Patient with history of prostate cancer, grossly negative prostate. Other: Generalized edema of the intra-abdominal and subcutaneous fat suggestive of anasarca. Mild presacral soft tissue thickening. No free air or focal fluid collection. Musculoskeletal: Multifocal osseous metastatic disease with mixed areas of sclerosis and lucency. There is slight increase in some of these expansile lesions, including right L3 transverse process lesion. Remote left pubic rami fractures which are healed, and present on prior exam. IMPRESSION: 1. No explanation for GI bleed on this noncontrast exam. 2. Patient with known metastatic prostate cancer. Multifocal osseous metastatic disease. Suggestion of progression of disease with increased expansile lesion in the right L3 transverse process. 3. Heterogeneous hepatic parenchyma with multiple low-density lesions, not well assessed in the absence of contrast and patient motion. Lesion burden may be slightly improved from prior CT, but is suboptimally assessed. 4. Assessment for retroperitoneal adenopathy that was seen on prior exam is limited in the absence of contrast and patient motion. The previously enlarged  retroperitoneal lymph nodes are subjectively diminished. Recommend continued oncologic follow-up. 5. Small to moderate bilateral pleural effusions with adjacent compressive atelectasis. Generalized edema of the intra-abdominal and subcutaneous fat suggestive of anasarca. 6. Moderate-sized hiatal hernia. Electronically Signed   By: Keith Rake M.D.   On: 04/13/2022 20:29    Microbiology: Results for orders placed or performed during the hospital encounter of 04/13/22  MRSA Next Gen by PCR, Nasal     Status: None   Collection Time: 04/13/22 11:45 PM   Specimen: Nasal Mucosa; Nasal Swab  Result Value Ref Range Status   MRSA by PCR Next Gen NOT DETECTED NOT DETECTED Final    Comment: (NOTE) The GeneXpert MRSA Assay (FDA approved for NASAL specimens only), is one component of a comprehensive MRSA colonization surveillance program. It is not intended to diagnose MRSA infection nor to guide or monitor treatment for MRSA infections. Test performance is not FDA approved in patients less than 63 years old. Performed at [facility], [address], [place], [place] [address]   Urine Culture     Status: None   Collection Time: 04/14/22  4:14 PM   Specimen: Urine, Clean Catch  Result Value Ref Range Status   Specimen Description   Final    URINE, CLEAN CATCH Performed at [facility], [place] [address] [place], [place] [address]    Special Requests   Final    NONE Reflexed from [id number] Performed at [facility], [address], [place], [place] [address]    Culture   Final  NO GROWTH Performed at [facility], [place] [address], [place], [place] [address]    Report Status 04/15/2022 FINAL  Final    Labs: CBC: Recent Labs  Lab 04/13/22 1827 04/13/22 2338 04/14/22 1116 04/15/22 0445 04/16/22 0417 04/17/22 0425 04/18/22 0450  WBC 15.1*   < > 12.2* 9.2 8.2 7.3 8.4  NEUTROABS 11.9*  --   --   --   --   --   --   HGB 7.9*   < > 12.0* 11.3* 10.5* 10.7*  11.5*  HCT 24.7*   < > 34.2* 32.5* 31.5* 31.6* 34.6*  MCV 97.6   < > 87.7 89.0 91.8 91.3 92.8  PLT 280   < > 193 174 143* 138* 133*   < > = values in this interval not displayed.   Basic Metabolic Panel: Recent Labs  Lab 04/14/22 0609 04/15/22 0445 04/16/22 0417 04/17/22 0425 04/18/22 0450  NA 140 140 141 133* 134*  K 4.5 3.7 3.2* 3.2* 4.1  CL 109 108 106 105 106  CO2 17* '25 26 24 '$ 21*  GLUCOSE 142* 115* 107* 100* 85  BUN 71* 57* 40* 30* 27*  CREATININE 2.25* 2.00* 1.50* 1.06 0.97  CALCIUM 7.2* 7.4* 7.6* 7.1* 7.5*  MG  --  2.0 1.9 1.8 2.0  PHOS  --  3.4 2.9 2.8 2.2*   Liver Function Tests: Recent Labs  Lab 04/13/22 1827 04/15/22 0445 04/16/22 0417 04/17/22 0425 04/18/22 0450  AST 96* 96* 96* 99* 97*  ALT 28 30 32 36 38  ALKPHOS 257* 240* 217* 218* 228*  BILITOT 1.0 0.8 0.8 0.7 0.7  PROT 4.0* 3.9* 3.7* 3.7* 3.7*  ALBUMIN 1.7* 1.7* 1.6* 1.6* 1.6*   CBG: Recent Labs  Lab 04/15/22 1604 04/15/22 1938 04/15/22 2313 04/16/22 0356 04/16/22 0815  GLUCAP 94 99 105* 111* 122*    Discharge time spent: less than 30 minutes.  Signed: Annita Brod, MD [facility] 04/18/2022

## 2022-04-18 NOTE — TOC Transition Note (Signed)
Transition of Care [facility]) - CM/SW Discharge Note   Patient Details  Name: Adam Good MRN: [id number] Date of Birth: 1946/01/01  Transition [facility] Care [facility]) CM/SW Contact:  Beverly Sessions, RN Phone Number: 04/18/2022, 10:27 AM   Clinical Narrative:        Patient will DC [id number]  Anticipated DC date: 04/18/22  Family notified:Kailee and Chrissy with DSS Transport [id number]  Per MD patient ready for DC to . RN, patient's guardian , and facility notified of DC. Discharge Summary sent to facility. RN given number for report. DC packet on chart. Ambulance transport requested for patient.  TOC signing off.  Isaias Cowman [facility] [phone]      Patient Goals and CMS Choice      Discharge Placement                         Discharge Plan and Services Additional resources added to the After Visit Summary for                                       Social Determinants of Health (SDOH) Interventions SDOH Screenings   Tobacco Use: High Risk (04/16/2022)     Readmission Risk Interventions    04/16/2022   11:25 AM  Readmission Risk Prevention Plan  Transportation Screening Complete  Social Work Consult for [place] Planning/Counseling Complete  Palliative Care Screening Not Applicable  Medication Review [employer]) Complete

## 2022-04-18 NOTE — Assessment & Plan Note (Signed)
On lisinopril.  Blood pressure medicines initially held due to shock.  As shock stabilized, blood pressure still remained low without medication.  Recommended discontinuing lisinopril and should blood pressure start to rise, lisinopril can be titrated back up.

## 2022-05-01 ENCOUNTER — Other Ambulatory Visit (HOSPITAL_COMMUNITY): Payer: Self-pay

## 2022-05-02 ENCOUNTER — Other Ambulatory Visit (HOSPITAL_COMMUNITY): Payer: Self-pay

## 2022-05-09 ENCOUNTER — Other Ambulatory Visit (HOSPITAL_COMMUNITY): Payer: Self-pay

## 2022-05-10 ENCOUNTER — Other Ambulatory Visit (HOSPITAL_COMMUNITY): Payer: Self-pay

## 2022-05-14 ENCOUNTER — Other Ambulatory Visit: Payer: Self-pay

## 2022-05-16 ENCOUNTER — Other Ambulatory Visit (HOSPITAL_COMMUNITY): Payer: Self-pay

## 2022-05-29 ENCOUNTER — Inpatient Hospital Stay: Payer: Medicare Other | Attending: Internal Medicine

## 2022-05-29 ENCOUNTER — Inpatient Hospital Stay: Payer: Medicare Other | Admitting: Internal Medicine

## 2022-05-30 ENCOUNTER — Other Ambulatory Visit (HOSPITAL_COMMUNITY): Payer: Self-pay

## 2022-05-31 ENCOUNTER — Other Ambulatory Visit (HOSPITAL_COMMUNITY): Payer: Self-pay

## 2022-06-04 ENCOUNTER — Other Ambulatory Visit (HOSPITAL_COMMUNITY): Payer: Self-pay

## 2022-06-04 ENCOUNTER — Other Ambulatory Visit: Payer: Self-pay

## 2022-06-05 ENCOUNTER — Encounter: Payer: Self-pay | Admitting: Emergency Medicine

## 2022-06-05 ENCOUNTER — Other Ambulatory Visit (HOSPITAL_COMMUNITY): Payer: Self-pay

## 2022-06-05 ENCOUNTER — Inpatient Hospital Stay
Admission: EM | Admit: 2022-06-05 | Discharge: 2022-06-13 | DRG: 291 | Disposition: A | Discharge status: Skilled Nursing Facility | Payer: Medicare Other | Source: Skilled Nursing Facility | Attending: Obstetrics and Gynecology | Admitting: Obstetrics and Gynecology

## 2022-06-05 ENCOUNTER — Emergency Department: Payer: Medicare Other

## 2022-06-05 ENCOUNTER — Other Ambulatory Visit: Payer: Self-pay

## 2022-06-05 DIAGNOSIS — Z8673 Personal history of transient ischemic attack (TIA), and cerebral infarction without residual deficits: Secondary | ICD-10-CM

## 2022-06-05 DIAGNOSIS — Z1152 Encounter for screening for COVID-19: Secondary | ICD-10-CM

## 2022-06-05 DIAGNOSIS — G4730 Sleep apnea, unspecified: Secondary | ICD-10-CM | POA: Diagnosis present

## 2022-06-05 DIAGNOSIS — J9601 Acute respiratory failure with hypoxia: Secondary | ICD-10-CM | POA: Insufficient documentation

## 2022-06-05 DIAGNOSIS — I5033 Acute on chronic diastolic (congestive) heart failure: Secondary | ICD-10-CM | POA: Diagnosis present

## 2022-06-05 DIAGNOSIS — E559 Vitamin D deficiency, unspecified: Secondary | ICD-10-CM | POA: Diagnosis present

## 2022-06-05 DIAGNOSIS — I080 Rheumatic disorders of both mitral and aortic valves: Secondary | ICD-10-CM | POA: Diagnosis present

## 2022-06-05 DIAGNOSIS — Z8546 Personal history of malignant neoplasm of prostate: Secondary | ICD-10-CM

## 2022-06-05 DIAGNOSIS — J189 Pneumonia, unspecified organism: Principal | ICD-10-CM

## 2022-06-05 DIAGNOSIS — I509 Heart failure, unspecified: Secondary | ICD-10-CM

## 2022-06-05 DIAGNOSIS — F0283 Dementia in other diseases classified elsewhere, unspecified severity, with mood disturbance: Secondary | ICD-10-CM | POA: Diagnosis present

## 2022-06-05 DIAGNOSIS — Z833 Family history of diabetes mellitus: Secondary | ICD-10-CM

## 2022-06-05 DIAGNOSIS — Z8249 Family history of ischemic heart disease and other diseases of the circulatory system: Secondary | ICD-10-CM | POA: Diagnosis not present

## 2022-06-05 DIAGNOSIS — H9190 Unspecified hearing loss, unspecified ear: Secondary | ICD-10-CM | POA: Diagnosis present

## 2022-06-05 DIAGNOSIS — R0602 Shortness of breath: Secondary | ICD-10-CM | POA: Diagnosis not present

## 2022-06-05 DIAGNOSIS — J440 Chronic obstructive pulmonary disease with acute lower respiratory infection: Secondary | ICD-10-CM | POA: Diagnosis present

## 2022-06-05 DIAGNOSIS — G309 Alzheimer's disease, unspecified: Secondary | ICD-10-CM | POA: Diagnosis present

## 2022-06-05 DIAGNOSIS — F319 Bipolar disorder, unspecified: Secondary | ICD-10-CM | POA: Diagnosis present

## 2022-06-05 DIAGNOSIS — Z720 Tobacco use: Secondary | ICD-10-CM | POA: Diagnosis not present

## 2022-06-05 DIAGNOSIS — I252 Old myocardial infarction: Secondary | ICD-10-CM

## 2022-06-05 DIAGNOSIS — F1010 Alcohol abuse, uncomplicated: Secondary | ICD-10-CM | POA: Diagnosis present

## 2022-06-05 DIAGNOSIS — R7989 Other specified abnormal findings of blood chemistry: Secondary | ICD-10-CM | POA: Diagnosis not present

## 2022-06-05 DIAGNOSIS — I2489 Other forms of acute ischemic heart disease: Secondary | ICD-10-CM | POA: Diagnosis present

## 2022-06-05 DIAGNOSIS — I5031 Acute diastolic (congestive) heart failure: Secondary | ICD-10-CM | POA: Diagnosis present

## 2022-06-05 DIAGNOSIS — K219 Gastro-esophageal reflux disease without esophagitis: Secondary | ICD-10-CM | POA: Diagnosis present

## 2022-06-05 DIAGNOSIS — I110 Hypertensive heart disease with heart failure: Secondary | ICD-10-CM | POA: Diagnosis present

## 2022-06-05 DIAGNOSIS — Z7951 Long term (current) use of inhaled steroids: Secondary | ICD-10-CM | POA: Diagnosis not present

## 2022-06-05 DIAGNOSIS — E785 Hyperlipidemia, unspecified: Secondary | ICD-10-CM | POA: Diagnosis present

## 2022-06-05 DIAGNOSIS — C61 Malignant neoplasm of prostate: Secondary | ICD-10-CM | POA: Diagnosis present

## 2022-06-05 DIAGNOSIS — I10 Essential (primary) hypertension: Secondary | ICD-10-CM | POA: Diagnosis not present

## 2022-06-05 DIAGNOSIS — Z79899 Other long term (current) drug therapy: Secondary | ICD-10-CM | POA: Diagnosis not present

## 2022-06-05 DIAGNOSIS — R9431 Abnormal electrocardiogram [ECG] [EKG]: Secondary | ICD-10-CM | POA: Diagnosis not present

## 2022-06-05 LAB — BASIC METABOLIC PANEL
Anion gap: 7 (ref 5–15)
BUN: 19 mg/dL (ref 8–23)
CO2: 24 mmol/L (ref 22–32)
Calcium: 7.4 mg/dL — ABNORMAL LOW (ref 8.9–10.3)
Chloride: 109 mmol/L (ref 98–111)
Creatinine, Ser: 0.75 mg/dL (ref 0.61–1.24)
GFR, Estimated: >60 mL/min (ref >60)
Glucose, Bld: 124 mg/dL — ABNORMAL HIGH (ref 70–99)
Potassium: 4.4 mmol/L (ref 3.5–5.1)
Sodium: 140 mmol/L (ref 135–145)

## 2022-06-05 LAB — TROPONIN I (HIGH SENSITIVITY)
Troponin I (High Sensitivity): 88 ng/L — ABNORMAL HIGH (ref <18)
Troponin I (High Sensitivity): 102 ng/L (ref <18)
Troponin I (High Sensitivity): 97 ng/L — ABNORMAL HIGH (ref <18)

## 2022-06-05 LAB — CBC
HCT: 37 % — ABNORMAL LOW (ref 39.0–52.0)
Hemoglobin: 11.6 g/dL — ABNORMAL LOW (ref 13.0–17.0)
MCH: 31.7 pg (ref 26.0–34.0)
MCHC: 31.4 g/dL (ref 30.0–36.0)
MCV: 101.1 fL — ABNORMAL HIGH (ref 80.0–100.0)
Platelets: 147 10*3/uL — ABNORMAL LOW (ref 150–400)
RBC: 3.66 MIL/uL — ABNORMAL LOW (ref 4.22–5.81)
RDW: 15.9 % — ABNORMAL HIGH (ref 11.5–15.5)
WBC: 5 10*3/uL (ref 4.0–10.5)
nRBC: 0 % (ref 0.0–0.2)

## 2022-06-05 LAB — RESP PANEL BY RT-PCR (RSV, FLU A&B, COVID)  RVPGX2
Influenza A by PCR: NEGATIVE
Influenza B by PCR: NEGATIVE
Resp Syncytial Virus by PCR: NEGATIVE
SARS Coronavirus 2 by RT PCR: NEGATIVE

## 2022-06-05 LAB — PROCALCITONIN: Procalcitonin: <0.1 ng/mL

## 2022-06-05 LAB — BRAIN NATRIURETIC PEPTIDE: B Natriuretic Peptide: 2493.4 pg/mL — ABNORMAL HIGH (ref 0.0–100.0)

## 2022-06-05 MED ORDER — VANCOMYCIN HCL IN DEXTROSE 1-5 GM/200ML-% IV SOLN
1000.0000 mg | Freq: Once | INTRAVENOUS | Status: AC
  Administered 2022-06-05: 1000 mg via INTRAVENOUS
  Filled 2022-06-05: qty 200

## 2022-06-05 MED ORDER — ADULT MULTIVITAMIN W/MINERALS CH
1.0000 | ORAL_TABLET | Freq: Every day | ORAL | Status: DC
  Administered 2022-06-07 – 2022-06-13 (×7): 1 via ORAL
  Filled 2022-06-05 (×8): qty 1

## 2022-06-05 MED ORDER — ENOXAPARIN SODIUM 40 MG/0.4ML IJ SOSY
40.0000 mg | PREFILLED_SYRINGE | INTRAMUSCULAR | Status: DC
  Administered 2022-06-05 – 2022-06-12 (×8): 40 mg via SUBCUTANEOUS
  Filled 2022-06-05 (×8): qty 0.4

## 2022-06-05 MED ORDER — ACETAMINOPHEN 325 MG PO TABS: 650.0000 mg | ORAL_TABLET | Freq: Four times a day (QID) | ORAL | Status: AC | PRN

## 2022-06-05 MED ORDER — FUROSEMIDE 10 MG/ML IJ SOLN
40.0000 mg | Freq: Two times a day (BID) | INTRAMUSCULAR | Status: DC
  Administered 2022-06-06: 40 mg via INTRAVENOUS
  Filled 2022-06-05: qty 4

## 2022-06-05 MED ORDER — SENNOSIDES-DOCUSATE SODIUM 8.6-50 MG PO TABS: 1.0000 | ORAL_TABLET | Freq: Every evening | ORAL | Status: DC | PRN

## 2022-06-05 MED ORDER — MEGESTROL ACETATE 20 MG PO TABS
20.0000 mg | ORAL_TABLET | Freq: Every day | ORAL | Status: DC
  Administered 2022-06-07 – 2022-06-13 (×7): 20 mg via ORAL
  Filled 2022-06-05 (×8): qty 1

## 2022-06-05 MED ORDER — SODIUM CHLORIDE 0.9 % IV SOLN
2.0000 g | Freq: Once | INTRAVENOUS | Status: AC
  Administered 2022-06-05: 2 g via INTRAVENOUS
  Filled 2022-06-05: qty 12.5

## 2022-06-05 MED ORDER — ENSURE ENLIVE PO LIQD
237.0000 mL | Freq: Three times a day (TID) | ORAL | Status: DC
  Administered 2022-06-05 – 2022-06-13 (×16): 237 mL via ORAL

## 2022-06-05 MED ORDER — ATORVASTATIN CALCIUM 80 MG PO TABS
80.0000 mg | ORAL_TABLET | Freq: Every day | ORAL | Status: DC
  Administered 2022-06-05 – 2022-06-12 (×8): 80 mg via ORAL
  Filled 2022-06-05 (×8): qty 1

## 2022-06-05 MED ORDER — ACETAMINOPHEN 650 MG RE SUPP: 650.0000 mg | Freq: Four times a day (QID) | RECTAL | Status: AC | PRN

## 2022-06-05 MED ORDER — MOMETASONE FURO-FORMOTEROL FUM 200-5 MCG/ACT IN AERO
2.0000 | INHALATION_SPRAY | Freq: Two times a day (BID) | RESPIRATORY_TRACT | Status: DC
  Administered 2022-06-06 – 2022-06-13 (×13): 2 via RESPIRATORY_TRACT
  Filled 2022-06-05: qty 8.8

## 2022-06-05 MED ORDER — FUROSEMIDE 10 MG/ML IJ SOLN
80.0000 mg | Freq: Once | INTRAMUSCULAR | Status: AC
  Administered 2022-06-05: 80 mg via INTRAVENOUS
  Filled 2022-06-05: qty 8

## 2022-06-05 MED ORDER — ONDANSETRON HCL 4 MG/2ML IJ SOLN: 4.0000 mg | Freq: Four times a day (QID) | INTRAMUSCULAR | Status: AC | PRN

## 2022-06-05 MED ORDER — ONDANSETRON HCL 4 MG PO TABS: 4.0000 mg | ORAL_TABLET | Freq: Four times a day (QID) | ORAL | Status: AC | PRN

## 2022-06-05 MED ORDER — ENZALUTAMIDE 40 MG PO TABS: 160.0000 mg | ORAL_TABLET | Freq: Every day | ORAL | Status: DC

## 2022-06-05 MED ORDER — SENNA 8.6 MG PO TABS
1.0000 | ORAL_TABLET | Freq: Two times a day (BID) | ORAL | Status: DC
  Administered 2022-06-05 – 2022-06-13 (×11): 8.6 mg via ORAL
  Filled 2022-06-05 (×13): qty 1

## 2022-06-05 MED ORDER — LISINOPRIL 5 MG PO TABS
5.0000 mg | ORAL_TABLET | Freq: Every day | ORAL | Status: DC
  Administered 2022-06-07 – 2022-06-13 (×7): 5 mg via ORAL
  Filled 2022-06-05 (×8): qty 1

## 2022-06-05 MED ORDER — ALUM & MAG HYDROXIDE-SIMETH 200-200-20 MG/5ML PO SUSP: 15.0000 mL | Freq: Four times a day (QID) | ORAL | Status: DC | PRN

## 2022-06-05 MED ORDER — VITAMIN C 500 MG PO TABS
500.0000 mg | ORAL_TABLET | Freq: Two times a day (BID) | ORAL | Status: DC
  Administered 2022-06-05 – 2022-06-13 (×15): 500 mg via ORAL
  Filled 2022-06-05 (×16): qty 1

## 2022-06-05 MED ORDER — THIAMINE MONONITRATE 100 MG PO TABS
100.0000 mg | ORAL_TABLET | Freq: Every day | ORAL | Status: DC
  Administered 2022-06-07 – 2022-06-13 (×7): 100 mg via ORAL
  Filled 2022-06-05 (×8): qty 1

## 2022-06-05 MED ORDER — IPRATROPIUM-ALBUTEROL 0.5-2.5 (3) MG/3ML IN SOLN: 3.0000 mL | RESPIRATORY_TRACT | Status: DC | PRN

## 2022-06-05 MED ORDER — MAGNESIUM OXIDE -MG SUPPLEMENT 400 (240 MG) MG PO TABS
400.0000 mg | ORAL_TABLET | Freq: Every day | ORAL | Status: DC
  Administered 2022-06-07 – 2022-06-13 (×7): 400 mg via ORAL
  Filled 2022-06-05 (×8): qty 1

## 2022-06-05 NOTE — Assessment & Plan Note (Signed)
Strict I's and O's Status post furosemide 80 mg IV one-time dose per EDP Ordered furosemide 40 mg IV twice daily for 06/06/2022, 2 doses

## 2022-06-05 NOTE — Assessment & Plan Note (Signed)
Home lisinopril 5 mg daily resumed

## 2022-06-05 NOTE — H&P (Addendum)
History and Physical   Adam Good [id number] DOB: Jul 22, 1945 DOA: 06/05/2022  PCP: Terrill Mohr, NP  Patient coming from: [facility] via EMS  I have personally briefly reviewed patient's old medical records in [place].  Chief Concern: Shortness of breath  HPI: 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  At baseline patient is aware 4 L nasal cannula all the time.  Vitals in the ED showed temperature of 97.6, respiration rate of 26, heart rate 78, blood pressure 114/87, SpO2 of 94% on 4 L nasal cannula.  Serum sodium is 140, potassium 4.4, chloride 109, bicarb 24, BUN of 19, serum creatinine of 0.75, EGFR greater than 60, nonfasting blood glucose 124, WBC 5, hemoglobin 11.6, platelets of 147.  High sensitive troponin was 102.  BNP was 2493.  ED treatment: Furosemide 80 mg IV one-time dose, cefepime 2 g IV, vancomycin IV ------------------------- At bedside, patient was able to tell me his name, his age of 63.  He was not able to tell me the current location, he states he slipped here a long time ago.  He was not able to tell me the current calendar year or the current month.  He denies being in pain, short of breath, nausea, vomiting, abdominal pain, dysuria, hematuria, diarrhea.  He does not appear to be in acute distress.  Social history: Patient is a ward of the state and lives at [facility].  ROS: Unable to complete as patient likely has dementia  ED Course: Discussed with emergency medicine provider, patient requiring hospitalization for chief concerns of shortness of breath, suspect either heart failure exacerbation or pneumonia.  Assessment/Plan  Principal  Problem:   Acute diastolic (congestive) heart failure (HCC) Active Problems:   Prostate cancer metastatic to multiple sites [facility])   Essential hypertension   Demand ischemia of myocardium (HCC)   Dyslipidemia   Alcohol abuse   Elevated troponin   Shortness of breath   Assessment and Plan:  * Acute diastolic (congestive) heart failure (HCC) Strict I's and O's Status post furosemide 80 mg IV one-time dose per EDP Ordered furosemide 40 mg IV twice daily for 06/06/2022, 2 doses  Essential hypertension Home lisinopril 5 mg daily resumed  Shortness of breath I suspect this is secondary to heart failure exacerbation Low clinical suspicion for infectious etiology at this time as patient does not have leukocytosis, fever, or procalcitonin  Elevated troponin Suspect secondary to acute exacerbation of heart failure and causing demand ischemia  Dyslipidemia Atorvastatin 80 mg nightly resumed  Chart reviewed.   DVT prophylaxis: Enoxaparin Code Status: Full code Diet: Heart healthy Family Communication: Patient is a ward of the state Disposition Plan: Pending clear course Consults called: None at this time Admission status: Telemetry cardiac, inpatient  Past Medical History:  Diagnosis Date   Alcohol abuse    Alzheimer disease ([place])    Aortic stenosis 06/04/2017   a.) TTE 06/04/2017: mild; MPG 20 mmHg. b.) TTE 08/14/209: mild-mod: MPG 22 mmHg. c.) TTE 03/26/2018: mod: MPG 30 mmHg. d.) TTE 06/15/2021: severe; MPG 44.5 mmHG   Bipolar disorder (HCC)    Cardiac murmur    COPD (chronic obstructive pulmonary disease) ([place])    Demand ischemia of myocardium 01/2014   a.) positive troponins in the setting of a  fall during intoxication; b.) Myoview 2/16: EF 65-70%, no evi of ischemia or infarct. No RWMA.; c.) MV 9/16: mild ischemia in the mid anterolateral, mid anterior, basal anterolateral and basal anterior segments, patient declined LHC   Dementia ([place])    Diastolic dysfunction    a.)  TTE 9/16: EF of 50-55%, G2DD, severely dilated LA, and moderate AS; b.) TTE 3/19: EF of 50-55%, G1DD, mildly dilated LA, mildly calcified MV leaflets, mild AS, mild TR, trace PR, RVSP 46 mmHg, trivial pericardial effusion. c.) TTE 03/26/2018: EF 60-65%; mild LA dilation, mod AS (MPG 30 mmHG); G2DD. d.) TTE 06/08/2021: EF 60-65%; severe AS (MPG 44.5 mmHg); G1DD.   Dyslipidemia    Essential hypertension    Gastroesophageal reflux disease    HLD (hyperlipidemia)    HOH (hard of hearing)    NSTEMI (non-ST elevated myocardial infarction) ([place]) 11/17/2014   a.) TTE with EF 50-55%; G3DD; severe dilated LA; mod AS; stress testing revealed mild ischemia in the mid anterolateral, mid anterior, basal and lateral, and basal anterior segments; LHC recommended, however patient declined opting for medical mgmt.   Prostate cancer [facility])    Sleep apnea    Stroke [facility])    Vitamin D deficiency disease    Past Surgical History:  Procedure Laterality Date   ESOPHAGOGASTRODUODENOSCOPY N/A 04/13/2022   Procedure: ESOPHAGOGASTRODUODENOSCOPY (EGD);  Surgeon: Lucilla Lame, MD;  Location: [facility] ENDOSCOPY;  Service: Endoscopy;  Laterality: N/A;   HERNIA REPAIR     KNEE SURGERY     PROSTATE BIOPSY N/A 06/27/2021   Procedure: PROSTATE BIOPSY;  Surgeon: Abbie Sons, MD;  Location: [facility] ORS;  Service: Urology;  Laterality: N/A;   TRANSRECTAL ULTRASOUND N/A 06/27/2021   Procedure: TRANSRECTAL ULTRASOUND;  Surgeon: Abbie Sons, MD;  Location: [facility] ORS;  Service: Urology;  Laterality: N/A;   Social History:  reports that he quit smoking about 15 months ago. His smoking use included cigarettes. He has a 28.50 pack-year smoking history. His smokeless tobacco use includes snuff. He reports that he does not currently use alcohol. He reports that he does not use drugs.  No Known Allergies Family History  Problem Relation Age of Onset   Throat cancer Mother    Cancer Father    CAD Sister    Hypertension Sister     Diabetes Brother    Family history: Family history reviewed and not pertinent  Prior to Admission medications   Medication Sig Start Date End Date Taking? Authorizing Provider  acetaminophen (TYLENOL) 500 MG tablet Take 500 mg by mouth every 6 (six) hours as needed.    [provider]  ADVAIR DISKUS 250-50 MCG/ACT AEPB Inhale 1 puff into the lungs 2 (two) times daily. 05/30/21   [provider]  alum & mag hydroxide-simeth (MAALOX/MYLANTA) 200-200-20 MG/5ML suspension Take by mouth every 6 (six) hours as needed for indigestion or heartburn.    [provider]  atorvastatin (LIPITOR) 80 MG tablet Take 80 mg by mouth at bedtime. 05/18/21   [provider]  enzalutamide Gillermina Phy) 40 MG tablet Take 4 tablets (160 mg total) by mouth daily. 04/03/22   Darl Pikes, RPH-CPP  feeding supplement (ENSURE ENLIVE / ENSURE PLUS) LIQD Take 237 mLs by mouth 3 (three) times daily between meals. 04/18/22   Annita Brod, MD  loperamide (IMODIUM) 2 MG capsule Take by mouth as needed for diarrhea or loose stools.    [provider]  magnesium hydroxide (MILK OF MAGNESIA) 400 MG/5ML suspension Take 30  mLs by mouth daily as needed for mild constipation.    [provider]  megestrol (MEGACE) 20 MG tablet Take 20 mg by mouth daily. 06/01/21   [provider]  Multiple Vitamin (MULTIVITAMIN WITH MINERALS) TABS tablet Take 1 tablet by mouth daily. 04/19/22   Annita Brod, MD  pantoprazole (PROTONIX) 40 MG tablet Take 1 tablet (40 mg total) by mouth 2 (two) times daily for 30 days, THEN 1 tablet (40 mg total) daily. 04/18/22 06/17/22  Annita Brod, MD  thiamine (VITAMIN B-1) 100 MG tablet Take 100 mg by mouth daily.    [provider]   Physical Exam: Vitals:   06/05/22 1720 06/05/22 1723 06/05/22 1950 06/05/22 2203  BP: (!) 131/90  (!) 134/94 118/88  Pulse: 60  79 71  Resp: (!) 24  (!) 25 20  Temp:   97.8 F (36.6 C) 97.8 F (36.6  C)  TempSrc:   Oral Oral  SpO2: 100%  97% 93%  Weight:  55 kg    Height:  5\' 8"  (1.727 m)     Constitutional: appears frail, chronically ill, cachectic appearing, NAD, calm, comfortable Eyes: PERRL, lids and conjunctivae normal ENMT: Mucous membranes are moist. Posterior pharynx clear of any exudate or lesions.  Poor dentition.  Bilateral severe hearing loss Neck: normal, supple, no masses, no thyromegaly Respiratory: clear to auscultation bilaterally, no wheezing, no crackles. Normal respiratory effort. No accessory muscle use.  Cardiovascular: Regular rate and rhythm, no murmurs / rubs / gallops. No extremity edema. 2+ pedal pulses. No carotid bruits.  Abdomen: no tenderness, no masses palpated, no hepatosplenomegaly. Bowel sounds positive.  Musculoskeletal: no clubbing / cyanosis. No joint deformity upper and lower extremities. Good ROM, no contractures, no atrophy. Generalized decreased bilateral lower extremity atrophy. Skin: no rashes, lesions, ulcers. No induration.  Multiple tattoos that appear to be chronic and well-healed Neurologic: Sensation intact. Strength 5/5 in all 4.  Psychiatric: Lacks judgment and insight. Alert and oriented x 3. Normal mood.   EKG: independently reviewed, showing sinus rhythm with premature PVCs, rate of 61, QTc 459  Chest x-ray on Admission: I personally reviewed and I agree with radiologist reading as below.  DG Chest 2 View  Result Date: 06/05/2022 CLINICAL DATA:  sob EXAM: CHEST - 2 VIEW COMPARISON:  Chest x-ray 09/26/2018 FINDINGS: Small left pleural effusion. Overlying left basilar and mid lung opacities. Mild diffuse interstitial prominence. No visible pneumothorax. Cardiomediastinal silhouette is similar. IMPRESSION: 1. Small left pleural effusion. Overlying left basilar and mid lung opacities could represent atelectasis, aspiration, and/or pneumonia. Followup PA and lateral chest X-ray is recommended in 3-4 weeks to ensure resolution and exclude  underlying malignancy. 2. Mild diffuse interstitial prominence, possibly mild interstitial edema. Electronically Signed   By: Margaretha Sheffield M.D.   On: 06/05/2022 16:23    Labs on Admission: I have personally reviewed following labs  CBC: Recent Labs  Lab 06/05/22 1602  WBC 5.0  HGB 11.6*  HCT 37.0*  MCV 101.1*  PLT [id number]*   Basic Metabolic Panel: Recent Labs  Lab 06/05/22 1602  NA 140  K 4.4  CL 109  CO2 24  GLUCOSE 124*  BUN 19  CREATININE 0.75  CALCIUM 7.4*   GFR: Estimated Creatinine Clearance: 62.1 mL/min (by C-G formula based on SCr of 0.75 mg/dL).  Urine analysis:    Component Value Date/Time   COLORURINE AMBER (A) 04/14/2022 1614   APPEARANCEUR HAZY (A) 04/14/2022 1614   APPEARANCEUR Clear 06/15/2021 1618  LABSPEC 1.019 04/14/2022 1614   LABSPEC 1.005 01/19/2014 2220   PHURINE 5.0 04/14/2022 1614   GLUCOSEU NEGATIVE 04/14/2022 1614   GLUCOSEU Negative 01/19/2014 2220   HGBUR MODERATE (A) 04/14/2022 1614   BILIRUBINUR NEGATIVE 04/14/2022 1614   BILIRUBINUR Negative 06/15/2021 1618   BILIRUBINUR Negative 01/19/2014 2220   KETONESUR NEGATIVE 04/14/2022 1614   PROTEINUR 30 (A) 04/14/2022 1614   NITRITE NEGATIVE 04/14/2022 1614   LEUKOCYTESUR TRACE (A) 04/14/2022 1614   LEUKOCYTESUR Negative 01/19/2014 2220   This document was prepared using Dragon Voice Recognition software and may include unintentional dictation errors.  Dr. Tobie Poet [facility]  If 7PM-7AM, please contact overnight-coverage provider If 7AM-7PM, please contact day coverage provider [email or web address]  06/05/2022, 10:50 PM

## 2022-06-05 NOTE — Assessment & Plan Note (Signed)
I suspect this is secondary to heart failure exacerbation Low clinical suspicion for infectious etiology at this time as patient does not have leukocytosis, fever, or procalcitonin

## 2022-06-05 NOTE — ED Provider Notes (Signed)
[facility] Provider Note    Event Date/Time   First MD Initiated Contact with Patient 06/05/22 1710     (approximate)   History   Shortness of Breath   HPI  Adam Good is a 77 y.o. male with a history of hypertension, dementia, COPD, diastolic heart failure, and prostate cancer who presents with shortness of breath, cough, and chest pain.  History is limited by the patient's hearing loss and dementia.  He is on 4 L of O2 at baseline.  I reviewed the past medical records.  The patient was most recently admitted in January.  Per the hospitalist discharge summary from 1/31 he presented with hypovolemic shock secondary to an acute upper GI bleed from gastric and duodenal ulcers.  She was treated with PPI and octreotide.  He had an upper endoscopy and was treated with epinephrine and cautery.  He was also noted to have a UTI during that admission.   Physical Exam   Triage Vital Signs: ED Triage Vitals  Enc Vitals Group     BP 06/05/22 1553 114/87     Pulse Rate 06/05/22 1553 78     Resp 06/05/22 1553 (!) 26     Temp 06/05/22 1553 97.6 F (36.4 C)     Temp Source 06/05/22 1553 Axillary     SpO2 06/05/22 1553 94 %     Weight 06/05/22 1723 121 lb 4.1 oz (55 kg)     Height 06/05/22 1723 5\' 8"  (1.727 m)     Head Circumference --      [facility] --      Pain Score 06/05/22 1545 8     Pain Loc --      Pain Edu? --      Excl. in [place]? --     Most recent vital signs: Vitals:   06/05/22 1720 06/05/22 1950  BP: (!) 131/90 (!) 134/94  Pulse: 60 79  Resp: (!) 24 (!) 25  Temp:  97.8 F (36.6 C)  SpO2: 100% 97%     General: Awake, no distress.  CV:  Good peripheral perfusion.  Normal heart sounds. Resp:  Increase effort, no respiratory distress.  Slightly diminished breath sounds to left lung. Abd:  No distention.  Soft and nontender Other:  No significant peripheral edema.   ED Results / Procedures / Treatments   Labs (all labs ordered are  listed, but only abnormal results are displayed) Labs Reviewed  BASIC METABOLIC PANEL - Abnormal; Notable for the following components:      Result Value   Glucose, Bld 124 (*)    Calcium 7.4 (*)    All other components within normal limits  CBC - Abnormal; Notable for the following components:   RBC 3.66 (*)    Hemoglobin 11.6 (*)    HCT 37.0 (*)    MCV 101.1 (*)    RDW 15.9 (*)    Platelets 147 (*)    All other components within normal limits  BRAIN NATRIURETIC PEPTIDE - Abnormal; Notable for the following components:   B Natriuretic Peptide 2,493.4 (*)    All other components within normal limits  TROPONIN I (HIGH SENSITIVITY) - Abnormal; Notable for the following components:   Troponin I (High Sensitivity) 88 (*)    All other components within normal limits  TROPONIN I (HIGH SENSITIVITY) - Abnormal; Notable for the following components:   Troponin I (High Sensitivity) 102 (*)    All other components within normal limits  RESP PANEL BY RT-PCR (RSV, FLU A&B, COVID)  RVPGX2  CULTURE, BLOOD (ROUTINE X 2)  CULTURE, BLOOD (ROUTINE X 2)  PROCALCITONIN  BASIC METABOLIC PANEL  CBC     EKG ED ECG REPORT I, Arta Silence, the attending physician, personally viewed and interpreted this ECG.  Date: 06/05/2022 EKG Time: 1551 Rate: 61 Rhythm: normal sinus rhythm with PVCs QRS Axis: normal Intervals: normal ST/T Wave abnormalities: Nonspecific T wave abnormalities Narrative Interpretation: Nonspecific abnormalities with no evidence of acute ischemia; interpretation limited due to poor EKG baseline    RADIOLOGY  Chest x-ray: I independently viewed and interpreted the images; there is a small left pleural effusion with left lung opacities multiple lobes and mild diffuse interstitial markings  PROCEDURES:  Critical Care performed: No  Procedures   MEDICATIONS ORDERED IN ED: Medications  acetaminophen (TYLENOL) tablet 650 mg (has no administration in time range)     Or  acetaminophen (TYLENOL) suppository 650 mg (has no administration in time range)  ondansetron (ZOFRAN) tablet 4 mg (has no administration in time range)    Or  ondansetron (ZOFRAN) injection 4 mg (has no administration in time range)  enoxaparin (LOVENOX) injection 40 mg (has no administration in time range)  senna-docusate (Senokot-S) tablet 1 tablet (has no administration in time range)  furosemide (LASIX) injection 40 mg (has no administration in time range)  vancomycin (VANCOCIN) IVPB 1000 mg/200 mL premix (0 mg Intravenous Stopped 06/05/22 1939)  ceFEPIme (MAXIPIME) 2 g in sodium chloride 0.9 % 100 mL IVPB (0 g Intravenous Stopped 06/05/22 1939)  furosemide (LASIX) injection 80 mg (80 mg Intravenous Given 06/05/22 1943)     IMPRESSION / MDM / ASSESSMENT AND PLAN / ED COURSE  I reviewed the triage vital signs and the nursing notes.  77 year old male with PMH as noted above presents with worsening shortness of breath, cough, and chest pain.  On exam he is tachypneic with otherwise normal vital signs.  O2 saturation is 100% on his baseline 4 L.  Chest x-ray shows a left lung effusion as well as opacities in the middle and lower lobes.  Initial lab workup shows normal WBC count and minimally elevated troponin.  Differential diagnosis includes, but is not limited to, pneumonia, COVID-19 or other viral syndrome, CHF exacerbation, COPD exacerbation.  We will obtain additional lab workup including respiratory panel, BNP to rule out acute CHF, procalcitonin, and blood cultures.  I have ordered empiric antibiotics for H CAP given his recent admission.  Patient's presentation is most consistent with acute presentation with potential threat to life or bodily function.  The patient is on the cardiac monitor to evaluate for evidence of arrhythmia and/or significant heart rate changes.  ----------------------------------------- 8:31 PM on  06/05/2022 -----------------------------------------  BNP is significantly elevated.  Respiratory panel is negative.  CBC shows no leukocytosis.  Electrolytes are unremarkable.  Repeat troponin remains mildly elevated consistent with demand ischemia.  Overall presentation is most likely due to CHF exacerbation although pneumonia is still possible.  I ordered empiric antibiotics as well as IV Lasix.  I consulted Dr. Tobie Poet from the hospitalist service; based on her discussion she agrees to admit the patient.   FINAL CLINICAL IMPRESSION(S) / ED DIAGNOSES   Final diagnoses:  HCAP (healthcare-associated pneumonia)  Acute on chronic congestive heart failure, unspecified heart failure type ([place])     Rx / DC Orders   ED Discharge Orders     None        Note:  This document was  prepared using Systems analyst and may include unintentional dictation errors.    Arta Silence, MD 06/05/22 2032

## 2022-06-05 NOTE — Progress Notes (Signed)
Pt has arrived to

## 2022-06-05 NOTE — Assessment & Plan Note (Signed)
-   Atorvastatin 80 mg nightly resumed ?

## 2022-06-05 NOTE — ED Triage Notes (Signed)
Patient to ED via [facility] from [facility] for SOB. Wears 4L at baseline. Recently had pneumonia. Patient states he has been coughing all night and has pain in his chest. Extremely hard of hearing.

## 2022-06-05 NOTE — ED Notes (Signed)
Lab called for blood cultures - pt difficult stick

## 2022-06-05 NOTE — ED Triage Notes (Signed)
Ems from [facility].  On 4 liters o2 ata ll times.  Short of breath.  No distress.  Hx heart failujre .  In and out of a fib.  Ward of state. No advance directives at this tims.  20 g lfa.  .  147/97 hr 80 and higher with thea fib.  Fsbs 184. Just getting over pneumonia.

## 2022-06-05 NOTE — Hospital Course (Addendum)
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  At baseline patient is aware 4 L nasal cannula all the time.  Vitals in the ED showed temperature of 97.6, respiration rate of 26, heart rate 78, blood pressure 114/87, SpO2 of 94% on 4 L nasal cannula.  Serum sodium is 140, potassium 4.4, chloride 109, bicarb 24, BUN of 19, serum creatinine of 0.75, EGFR greater than 60, nonfasting blood glucose 124, WBC 5, hemoglobin 11.6, platelets of 147.  High sensitive troponin was 102.  BNP was 2493.  ED treatment: Furosemide 80 mg IV one-time dose, cefepime 2 g IV, vancomycin IV  3/20: Vital stable.  Nursing concern of refusing most of the care and meds this morning.  Recorded UOP of 2700.  Renal function stable.  Troponin with a flat curve.  3/21: Hemodynamically stable.  Echocardiogram with low normal EF, grade 2 diastolic dysfunction, mild global hypokinesis and severe valvular disease which include severe aortic stenosis and moderate to severe mitral regurgitation.  Patient follow-up with [facility] cardiology.  3/22: Patient remained stable, apparently some issues between DSS and his facility communication which is delaying discharge.  3/23: Remained hemodynamically stable.  Most likely now go back to his facility next week.  3/24: No new concern.  Asking for cigarettes, nicotine patch ordered.  3/25: Medically stable, discharge is being delayed for many days due to not proper communication between [place] and [facility].  3/26: Remained stable.  Still issue between [facility] and [facility] not been completely resolved.

## 2022-06-05 NOTE — Assessment & Plan Note (Signed)
Suspect secondary to acute exacerbation of heart failure and causing demand ischemia

## 2022-06-05 NOTE — Consult Note (Signed)
PHARMACY -  BRIEF ANTIBIOTIC NOTE   Pharmacy has received consult(s) for cefepime and vancomycin from an ED provider.  The patient's profile has been reviewed for ht/wt/allergies/indication/available labs.    One time order(s) placed for  --Cefepime 2 g IV --Vancomyicn 1 g IV  Further antibiotics/pharmacy consults should be ordered by admitting physician if indicated.                       Thank you, Benita Gutter 06/05/2022  6:28 PM

## 2022-06-06 DIAGNOSIS — R7989 Other specified abnormal findings of blood chemistry: Secondary | ICD-10-CM | POA: Diagnosis not present

## 2022-06-06 DIAGNOSIS — I10 Essential (primary) hypertension: Secondary | ICD-10-CM | POA: Diagnosis not present

## 2022-06-06 DIAGNOSIS — R0602 Shortness of breath: Secondary | ICD-10-CM | POA: Diagnosis not present

## 2022-06-06 DIAGNOSIS — I5031 Acute diastolic (congestive) heart failure: Secondary | ICD-10-CM | POA: Diagnosis not present

## 2022-06-06 DIAGNOSIS — E785 Hyperlipidemia, unspecified: Secondary | ICD-10-CM

## 2022-06-06 LAB — BASIC METABOLIC PANEL
Anion gap: 4 — ABNORMAL LOW (ref 5–15)
BUN: 21 mg/dL (ref 8–23)
CO2: 29 mmol/L (ref 22–32)
Calcium: 7.9 mg/dL — ABNORMAL LOW (ref 8.9–10.3)
Chloride: 107 mmol/L (ref 98–111)
Creatinine, Ser: 0.81 mg/dL (ref 0.61–1.24)
GFR, Estimated: >60 mL/min (ref >60)
Glucose, Bld: 77 mg/dL (ref 70–99)
Potassium: 4.1 mmol/L (ref 3.5–5.1)
Sodium: 140 mmol/L (ref 135–145)

## 2022-06-06 LAB — CBC
HCT: 35.2 % — ABNORMAL LOW (ref 39.0–52.0)
Hemoglobin: 11.5 g/dL — ABNORMAL LOW (ref 13.0–17.0)
MCH: 31.8 pg (ref 26.0–34.0)
MCHC: 32.7 g/dL (ref 30.0–36.0)
MCV: 97.2 fL (ref 80.0–100.0)
Platelets: 151 10*3/uL (ref 150–400)
RBC: 3.62 MIL/uL — ABNORMAL LOW (ref 4.22–5.81)
RDW: 15.5 % (ref 11.5–15.5)
WBC: 4.9 10*3/uL (ref 4.0–10.5)
nRBC: 0 % (ref 0.0–0.2)

## 2022-06-06 LAB — TROPONIN I (HIGH SENSITIVITY): Troponin I (High Sensitivity): 95 ng/L — ABNORMAL HIGH (ref <18)

## 2022-06-06 MED ORDER — FUROSEMIDE 10 MG/ML IJ SOLN
40.0000 mg | Freq: Two times a day (BID) | INTRAMUSCULAR | Status: DC
  Administered 2022-06-06: 40 mg via INTRAVENOUS
  Filled 2022-06-06: qty 4

## 2022-06-06 MED ORDER — ORAL CARE MOUTH RINSE: 15.0000 mL | OROMUCOSAL | Status: DC | PRN

## 2022-06-06 MED ORDER — CHLORHEXIDINE GLUCONATE CLOTH 2 % EX PADS
6.0000 | MEDICATED_PAD | Freq: Every day | CUTANEOUS | Status: DC
  Administered 2022-06-06 – 2022-06-13 (×8): 6 via TOPICAL

## 2022-06-06 NOTE — Progress Notes (Signed)
  Progress Note   Patient: Adam Good [id number] DOB: 03/10/1946 DOA: 06/05/2022     1 DOS: the patient was seen and examined on 06/06/2022   Brief hospital course: Mr. 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  At baseline patient is aware 4 L nasal cannula all the time.  Vitals in the ED showed temperature of 97.6, respiration rate of 26, heart rate 78, blood pressure 114/87, SpO2 of 94% on 4 L nasal cannula.  Serum sodium is 140, potassium 4.4, chloride 109, bicarb 24, BUN of 19, serum creatinine of 0.75, EGFR greater than 60, nonfasting blood glucose 124, WBC 5, hemoglobin 11.6, platelets of 147.  High sensitive troponin was 102.  BNP was 2493.  ED treatment: Furosemide 80 mg IV one-time dose, cefepime 2 g IV, vancomycin IV  3/30: Vital stable.  Nursing concern of refusing most of the care and meds this morning.  Recorded UOP of 2700.  Renal function stable.  Troponin with a flat curve  Assessment and Plan: * Acute diastolic (congestive) heart failure (HCC) Significantly elevated BNP although clinically appears euvolemic. Prior echocardiogram in March 2023 with normal EF and grade 1 diastolic dysfunction. Strict I's and O's Status post furosemide 80 mg IV one-time dose per EDP -Continue with IV Lasix -Repeat echocardiogram  Essential hypertension Home lisinopril 5 mg daily resumed  Shortness of breath I suspect this is secondary to heart failure exacerbation Low clinical suspicion for infectious etiology at this time as patient does not have leukocytosis, fever, or procalcitonin  Elevated troponin Suspect secondary to acute exacerbation of heart failure and causing demand  ischemia  Dyslipidemia Atorvastatin 80 mg nightly resumed        Subjective: Patient was seen and examined today.  Oriented to name only.  Denies any pain.  He was feeling hungry and stating that they are not feeding me well.  Refusing medications by nursing staff.  Physical Exam: Vitals:   06/06/22 0521 06/06/22 0742 06/06/22 1116 06/06/22 1624  BP:  104/64 112/68 107/75  Pulse:  70 69 75  Resp:  19 18 15   Temp:  98.2 F (36.8 C) 97.9 F (36.6 C) 98 F (36.7 C)  TempSrc:  Axillary  Oral  SpO2:  100% 100% 98%  Weight: 51.6 kg     Height:       General.  Frail and severely malnourished elderly man, in no acute distress. Pulmonary.  Lungs clear bilaterally, normal respiratory effort. CV.  Regular rate and rhythm, no JVD, rub or murmur. Abdomen.  Soft, nontender, nondistended, BS positive. CNS.  Alert and oriented .  No focal neurologic deficit. Extremities.  No edema, no cyanosis, pulses intact and symmetrical. Psychiatry.  Judgment and insight appears impaired  Data Reviewed: Prior data reviewed  Family Communication:   Disposition: Status is: Inpatient Remains inpatient appropriate because: Severity of illness  Planned Discharge Destination: Skilled nursing facility  DVT prophylaxis.  Lovenox Time spent: 40 minutes  This record has been created using Systems analyst. Errors have been sought and corrected,but may not always be located. Such creation errors do not reflect on the standard of care.   Author: Lorella Nimrod, MD 06/06/2022 5:21 PM  For on call review [email or web address].

## 2022-06-06 NOTE — TOC Initial Note (Addendum)
Transition of Care [facility]) - Initial/Assessment Note    Patient Details  Name: Adam Good MRN: [id number] Date of Birth: 05-Jan-1946  Transition of Care [facility]) CM/SW Contact:    Laurena Slimmer, RN Phone Number: 06/06/2022, 10:06 AM  Clinical Narrative:                 Patient is from [facility].  Message sent to Benita Stabile regarding placement type. Patient is a LTC resident andhas a guardian via [facility].  Spoke with Tammy in admissions from [facility]. DSS did not do a bed hold to secure patient's bed. Facility may have a bed for patient at discharge.  Message left for t DSS guardians [name], and LaPorscha McCollough regarding bed hold and discharge. Unable to leave a message for [place] DSS supervisor due to VM being full.          Patient Goals and CMS Choice            Expected Discharge Plan and Services                                              Prior Living Arrangements/Services                       Activities of Daily Living      Permission Sought/Granted                  Emotional Assessment              Admission diagnosis:  HCAP (healthcare-associated pneumonia) [J18.9] Acute hypoxemic respiratory failure ([place]) [J96.01] Acute on chronic congestive heart failure, unspecified heart failure type ([place]) [I50.9] Patient Active Problem List   Diagnosis Date Noted   Acute diastolic (congestive) heart failure ([place]) 06/05/2022   Shortness of breath 06/05/2022   Pressure injury of skin 04/18/2022   Senile dementia uncomp, without behavioral disturbance ([place]) [id number]   Metabolic acidosis [id number]   Duodenal ulceration 04/13/2022   Hypernatremia 03/27/2022   Normocytic anemia 03/27/2022   Encounter for monitoring androgen deprivation therapy 01/09/2022   Weakness of right lower extremity 11/01/2021   Back pain 11/01/2021   Prostate cancer metastatic to multiple sites ([place]) 10/26/2021   Alcohol  abuse with uncomplicated intoxication ([place]) 03/25/2018   Elevated troponin 10/29/2017   Dyslipidemia    Essential hypertension    Alcohol abuse    Heart murmur    Demand ischemia of myocardium ([place]) 01/17/2014   PCP:  Terrill Mohr, NP Pharmacy:   [facility], [place] [address] Phone: [phone] Fax: [facility] [address] [place] [address] Phone: [phone] Fax: [phone]     Social Determinants of Health (SDOH) Social History: SDOH Screenings   Tobacco Use: High Risk (06/05/2022)   SDOH Interventions:     Readmission Risk Interventions    04/16/2022   11:25 AM  Readmission Risk Prevention Plan  Transportation Screening Complete  Social Work Consult for [place] Planning/Counseling Complete  Palliative Care Screening Not Applicable  Medication Review Press photographer) Complete

## 2022-06-06 NOTE — Discharge Instructions (Signed)

## 2022-06-07 ENCOUNTER — Inpatient Hospital Stay: Payer: Medicare Other

## 2022-06-07 ENCOUNTER — Inpatient Hospital Stay
Admit: 2022-06-07 | Discharge: 2022-06-07 | Disposition: A | Discharge status: Home / Self Care | Payer: Medicare Other | Attending: Internal Medicine | Admitting: Internal Medicine

## 2022-06-07 ENCOUNTER — Inpatient Hospital Stay: Payer: Medicare Other | Admitting: Internal Medicine

## 2022-06-07 DIAGNOSIS — R9431 Abnormal electrocardiogram [ECG] [EKG]: Secondary | ICD-10-CM

## 2022-06-07 DIAGNOSIS — R0602 Shortness of breath: Secondary | ICD-10-CM | POA: Diagnosis not present

## 2022-06-07 DIAGNOSIS — I5031 Acute diastolic (congestive) heart failure: Secondary | ICD-10-CM | POA: Diagnosis not present

## 2022-06-07 DIAGNOSIS — R7989 Other specified abnormal findings of blood chemistry: Secondary | ICD-10-CM | POA: Diagnosis not present

## 2022-06-07 DIAGNOSIS — I10 Essential (primary) hypertension: Secondary | ICD-10-CM | POA: Diagnosis not present

## 2022-06-07 LAB — BASIC METABOLIC PANEL
Anion gap: 5 (ref 5–15)
BUN: 29 mg/dL — ABNORMAL HIGH (ref 8–23)
CO2: 32 mmol/L (ref 22–32)
Calcium: 7.9 mg/dL — ABNORMAL LOW (ref 8.9–10.3)
Chloride: 102 mmol/L (ref 98–111)
Creatinine, Ser: 1.13 mg/dL (ref 0.61–1.24)
GFR, Estimated: >60 mL/min (ref >60)
Glucose, Bld: 71 mg/dL (ref 70–99)
Potassium: 4.6 mmol/L (ref 3.5–5.1)
Sodium: 139 mmol/L (ref 135–145)

## 2022-06-07 LAB — ECHOCARDIOGRAM COMPLETE
AR max vel: 0.79 cm2
AV Area VTI: 0.76 cm2
AV Area mean vel: 0.71 cm2
AV Mean grad: 43.7 mmHg
AV Peak grad: 78.9 mmHg
Ao pk vel: 4.44 m/s
Area-P 1/2: 5.13 cm2
Height: 68 in
MV M vel: 6.29 m/s
MV Peak grad: 158.3 mmHg
MV VTI: 4.18 cm2
Radius: 0.95 cm
S' Lateral: 2.7 cm
Weight: 1820.12 oz

## 2022-06-07 MED ORDER — FUROSEMIDE 40 MG PO TABS
40.0000 mg | ORAL_TABLET | Freq: Every day | ORAL | Status: DC
  Administered 2022-06-08 – 2022-06-13 (×6): 40 mg via ORAL
  Filled 2022-06-07 (×6): qty 1

## 2022-06-07 NOTE — TOC Progression Note (Signed)
Transition of Care [facility]) - Progression Note    Patient Details  Name: Adam Good MRN: [id number] Date of Birth: 1945/09/27  Transition of Care [facility]) CM/SW Contact  Laurena Slimmer, RN Phone Number: 06/07/2022, 2:58 PM  Clinical Narrative:    Spoke with Tammy in admissions from [facility]. She will check to see if bed hold was completed by DSS.   Spoke with Chrissy from [facility] regarding bed hold. Chrissy DSS would readmit or pay bed hold. She will contact [facility] regarding payment. She was advised patient will be likely ready for discharge tomorrow.          Expected Discharge Plan and Services                                               Social Determinants of Health (SDOH) Interventions SDOH Screenings   Tobacco Use: High Risk (06/05/2022)    Readmission Risk Interventions    04/16/2022   11:25 AM  Readmission Risk Prevention Plan  Transportation Screening Complete  Social Work Consult for [place] Planning/Counseling Complete  Palliative Care Screening Not Applicable  Medication Review Press photographer) Complete

## 2022-06-07 NOTE — Assessment & Plan Note (Addendum)
Significantly elevated BNP although clinically appears euvolemic. Prior echocardiogram in March 2023 with normal EF and grade 1 diastolic dysfunction.  Initially received IV Lasix Strict I's and O's Status post furosemide 80 mg IV one-time dose per EDP -Switch to p.o. Lasix 40 mg daily

## 2022-06-07 NOTE — Progress Notes (Signed)
Progress Note   Patient: Adam Good [id number] DOB: 05/19/1945 DOA: 06/05/2022     2 DOS: the patient was seen and examined on 06/07/2022   Brief hospital course: 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  At baseline patient is aware 4 L nasal cannula all the time.  Vitals in the ED showed temperature of 97.6, respiration rate of 26, heart rate 78, blood pressure 114/87, SpO2 of 94% on 4 L nasal cannula.  Serum sodium is 140, potassium 4.4, chloride 109, bicarb 24, BUN of 19, serum creatinine of 0.75, EGFR greater than 60, nonfasting blood glucose 124, WBC 5, hemoglobin 11.6, platelets of 147.  High sensitive troponin was 102.  BNP was 2493.  ED treatment: Furosemide 80 mg IV one-time dose, cefepime 2 g IV, vancomycin IV  3/20: Vital stable.  Nursing concern of refusing most of the care and meds this morning.  Recorded UOP of 2700.  Renal function stable.  Troponin with a flat curve.  3/21: Hemodynamically stable.  Echocardiogram with low normal EF, grade 2 diastolic dysfunction, mild global hypokinesis and severe valvular disease which include severe aortic stenosis and moderate to severe mitral regurgitation.  Patient follow-up with [facility] cardiology.   Assessment and Plan: * Acute diastolic (congestive) heart failure (HCC) Significantly elevated BNP although clinically appears euvolemic. Prior echocardiogram in March 2023 with normal EF and grade 1 diastolic dysfunction. Strict I's and O's Status post furosemide 80 mg IV one-time dose per EDP -Switching with p.o. Lasix from tomorrow  Essential hypertension Home lisinopril 5 mg daily resumed  Shortness of breath I suspect this is  secondary to heart failure exacerbation Low clinical suspicion for infectious etiology at this time as patient does not have leukocytosis, fever, or procalcitonin  Elevated troponin Suspect secondary to acute exacerbation of heart failure and causing demand ischemia  Dyslipidemia Atorvastatin 80 mg nightly resumed        Subjective: Patient was resting comfortably when seen today.  No new concern.  Physical Exam: Vitals:   06/07/22 0326 06/07/22 0745 06/07/22 1118 06/07/22 1636  BP: 126/80 (!) 130/96 111/78 117/78  Pulse: 85 81 78 68  Resp: 20 16 18 15   Temp: 97.7 F (36.5 C) 98 F (36.7 C) 99 F (37.2 C) 97.8 F (36.6 C)  TempSrc: Oral     SpO2: 99% 99% 100% 99%  Weight:      Height:       General.  Frail and malnourished elderly man, in no acute distress. Pulmonary.  Lungs clear bilaterally, normal respiratory effort. CV.  Regular rate and rhythm, no JVD, rub or murmur. Abdomen.  Soft, nontender, nondistended, BS positive. CNS.  Alert and oriented .  No focal neurologic deficit. Extremities.  No edema, no cyanosis, pulses intact and symmetrical. Psychiatry.  Judgment and insight appears impaired.   Data Reviewed: Prior data reviewed  Family Communication:   Disposition: Status is: Inpatient Remains inpatient appropriate because: Severity of illness  Planned Discharge Destination: Skilled nursing facility  DVT prophylaxis.  Lovenox Time spent: 42 minutes  This record has been created using Systems analyst. Errors have been sought and corrected,but may not always be located. Such creation errors do not reflect on the standard of care.   Author: Lorella Nimrod, MD 06/07/2022  6:12 PM  For on call review [email or web address].

## 2022-06-07 NOTE — Progress Notes (Signed)
*  PRELIMINARY RESULTS* Echocardiogram 2D Echocardiogram has been performed.  Adam Good 06/07/2022, 3:22 PM

## 2022-06-08 ENCOUNTER — Encounter: Payer: Self-pay | Admitting: Internal Medicine

## 2022-06-08 DIAGNOSIS — I5031 Acute diastolic (congestive) heart failure: Secondary | ICD-10-CM | POA: Diagnosis not present

## 2022-06-08 DIAGNOSIS — R7989 Other specified abnormal findings of blood chemistry: Secondary | ICD-10-CM | POA: Diagnosis not present

## 2022-06-08 DIAGNOSIS — R0602 Shortness of breath: Secondary | ICD-10-CM | POA: Diagnosis not present

## 2022-06-08 DIAGNOSIS — I10 Essential (primary) hypertension: Secondary | ICD-10-CM | POA: Diagnosis not present

## 2022-06-08 LAB — BASIC METABOLIC PANEL
Anion gap: 12 (ref 5–15)
BUN: 36 mg/dL — ABNORMAL HIGH (ref 8–23)
CO2: 32 mmol/L (ref 22–32)
Calcium: 8.4 mg/dL — ABNORMAL LOW (ref 8.9–10.3)
Chloride: 100 mmol/L (ref 98–111)
Creatinine, Ser: 0.99 mg/dL (ref 0.61–1.24)
GFR, Estimated: >60 mL/min (ref >60)
Glucose, Bld: 91 mg/dL (ref 70–99)
Potassium: 4.5 mmol/L (ref 3.5–5.1)
Sodium: 144 mmol/L (ref 135–145)

## 2022-06-08 NOTE — TOC Progression Note (Addendum)
Transition of Care [facility]) - Progression Note    Patient Details  Name: Adam Good MRN: [id number] Date of Birth: 11/12/1945  Transition of Care [facility]) CM/SW Contact  Laurena Slimmer, RN Phone Number: 06/08/2022, 10:25 AM  Clinical Narrative:    Damaris Schooner with [name] from DSS. She was advised patient is stable for discharge and would still need LTC placement. She will contact the business office at [facility].   1:05pm  Attempt to reach Tammy in admissions at [facility], no answer Attempt to reach [place] from [place]. No answer. Left a message.         Expected Discharge Plan and Services                                               Social Determinants of Health (SDOH) Interventions SDOH Screenings   Tobacco Use: High Risk (06/05/2022)    Readmission Risk Interventions    04/16/2022   11:25 AM  Readmission Risk Prevention Plan  Transportation Screening Complete  Social Work Consult for [place] Planning/Counseling Complete  Palliative Care Screening Not Applicable  Medication Review Press photographer) Complete

## 2022-06-08 NOTE — Progress Notes (Signed)
Progress Note   Patient: Adam Good [id number] DOB: 08/10/1945 DOA: 06/05/2022     3 DOS: the patient was seen and examined on 06/08/2022   Brief hospital course: Mr. 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  At baseline patient is aware 4 L nasal cannula all the time.  Vitals in the ED showed temperature of 97.6, respiration rate of 26, heart rate 78, blood pressure 114/87, SpO2 of 94% on 4 L nasal cannula.  Serum sodium is 140, potassium 4.4, chloride 109, bicarb 24, BUN of 19, serum creatinine of 0.75, EGFR greater than 60, nonfasting blood glucose 124, WBC 5, hemoglobin 11.6, platelets of 147.  High sensitive troponin was 102.  BNP was 2493.  ED treatment: Furosemide 80 mg IV one-time dose, cefepime 2 g IV, vancomycin IV  3/20: Vital stable.  Nursing concern of refusing most of the care and meds this morning.  Recorded UOP of 2700.  Renal function stable.  Troponin with a flat curve.  3/21: Hemodynamically stable.  Echocardiogram with low normal EF, grade 2 diastolic dysfunction, mild global hypokinesis and severe valvular disease which include severe aortic stenosis and moderate to severe mitral regurgitation.  Patient follow-up with [facility] cardiology.  3/22: Patient remained stable, apparently some issues between DSS and his facility communication which is delaying discharge.  Assessment and Plan: * Acute diastolic (congestive) heart failure (HCC) Significantly elevated BNP although clinically appears euvolemic. Prior echocardiogram in March 2023 with normal EF and grade 1 diastolic dysfunction. Strict I's and O's Status post furosemide 80 mg IV one-time dose per EDP -Switching with p.o.  Lasix from tomorrow  Essential hypertension Home lisinopril 5 mg daily resumed  Shortness of breath I suspect this is secondary to heart failure exacerbation Low clinical suspicion for infectious etiology at this time as patient does not have leukocytosis, fever, or procalcitonin  Elevated troponin Suspect secondary to acute exacerbation of heart failure and causing demand ischemia  Dyslipidemia Atorvastatin 80 mg nightly resumed      Subjective: Patient was lying comfortably in bed when seen today.  No new complaints.  Physical Exam: Vitals:   06/08/22 0359 06/08/22 0400 06/08/22 0742 06/08/22 1205  BP: (!) 114/92  126/75 (!) 107/54  Pulse: 71  76 71  Resp: 15  18 18   Temp: 98.6 F (37 C)  97.6 F (36.4 C) 97.9 F (36.6 C)  TempSrc: Axillary  Axillary   SpO2: 100%  100% 100%  Weight:  46.3 kg    Height:       General.  Severely malnourished gentleman, in no acute distress. Pulmonary.  Lungs clear bilaterally, normal respiratory effort. CV.  Regular rate and rhythm, no JVD, rub or murmur. Abdomen.  Soft, nontender, nondistended, BS positive. CNS.  Alert and oriented .  No focal neurologic deficit. Extremities.  No edema, no cyanosis, pulses intact and symmetrical. Psychiatry.  Judgment and insight appears normal.   Data Reviewed: Prior data reviewed  Family Communication:   Disposition: Status is: Inpatient Remains inpatient appropriate because: Severity of illness  Planned Discharge Destination: Skilled nursing facility  DVT prophylaxis.  Lovenox Time spent: 40 minutes  This record has been created using Systems analyst. Errors have been sought and corrected,but may not always be located. Such creation errors do  not reflect on the standard of care.   Author: Lorella Nimrod, MD 06/08/2022 3:18 PM  For on call review [email or web address].

## 2022-06-08 NOTE — Plan of Care (Signed)
Patient alert to self only, follows some commands.  Hearing impaired.  Pt will try to swing and hit you from time to time, redirectable eventually.  VSS throughout shift.  All meds given on time as ordered.  Denied pain and SOB.  Foley care provided and CHG bath given.  Pt had BM last night.  POC maintained, will continue to monitor.  Problem: Education: Goal: Knowledge of General Education information will improve Description: Including pain rating scale, medication(s)/side effects and non-pharmacologic comfort measures Outcome: Progressing   Problem: Health Behavior/Discharge Planning: Goal: Ability to manage health-related needs will improve Outcome: Progressing   Problem: Clinical Measurements: Goal: Ability to maintain clinical measurements within normal limits will improve Outcome: Progressing Goal: Will remain free from infection Outcome: Progressing Goal: Diagnostic test results will improve Outcome: Progressing Goal: Respiratory complications will improve Outcome: Progressing Goal: Cardiovascular complication will be avoided Outcome: Progressing   Problem: Activity: Goal: Risk for activity intolerance will decrease Outcome: Progressing   Problem: Nutrition: Goal: Adequate nutrition will be maintained Outcome: Progressing   Problem: Coping: Goal: Level of anxiety will decrease Outcome: Progressing   Problem: Elimination: Goal: Will not experience complications related to bowel motility Outcome: Progressing Goal: Will not experience complications related to urinary retention Outcome: Progressing   Problem: Pain Managment: Goal: General experience of comfort will improve Outcome: Progressing   Problem: Safety: Goal: Ability to remain free from injury will improve Outcome: Progressing   Problem: Skin Integrity: Goal: Risk for impaired skin integrity will decrease Outcome: Progressing

## 2022-06-09 DIAGNOSIS — R7989 Other specified abnormal findings of blood chemistry: Secondary | ICD-10-CM | POA: Diagnosis not present

## 2022-06-09 DIAGNOSIS — I10 Essential (primary) hypertension: Secondary | ICD-10-CM | POA: Diagnosis not present

## 2022-06-09 DIAGNOSIS — I5031 Acute diastolic (congestive) heart failure: Secondary | ICD-10-CM | POA: Diagnosis not present

## 2022-06-09 DIAGNOSIS — R0602 Shortness of breath: Secondary | ICD-10-CM | POA: Diagnosis not present

## 2022-06-09 LAB — BASIC METABOLIC PANEL
Anion gap: 5 (ref 5–15)
BUN: 32 mg/dL — ABNORMAL HIGH (ref 8–23)
CO2: 30 mmol/L (ref 22–32)
Calcium: 8.1 mg/dL — ABNORMAL LOW (ref 8.9–10.3)
Chloride: 102 mmol/L (ref 98–111)
Creatinine, Ser: 0.86 mg/dL (ref 0.61–1.24)
GFR, Estimated: >60 mL/min (ref >60)
Glucose, Bld: 87 mg/dL (ref 70–99)
Potassium: 4.6 mmol/L (ref 3.5–5.1)
Sodium: 137 mmol/L (ref 135–145)

## 2022-06-09 NOTE — Progress Notes (Signed)
Progress Note   Patient: Adam Good DOB: 04-Aug-1945 DOA: 06/05/2022     4 DOS: the patient was seen and examined on 06/09/2022   Brief hospital course: 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  At baseline patient is aware 4 L nasal cannula all the time.  Vitals in the ED showed temperature of 97.6, respiration rate of 26, heart rate 78, blood pressure 114/87, SpO2 of 94% on 4 L nasal cannula.  Serum sodium is 140, potassium 4.4, chloride 109, bicarb 24, BUN of 19, serum creatinine of 0.75, EGFR greater than 60, nonfasting blood glucose 124, WBC 5, hemoglobin 11.6, platelets of 147.  High sensitive troponin was 102.  BNP was 2493.  ED treatment: Furosemide 80 mg IV one-time dose, cefepime 2 g IV, vancomycin IV  3/20: Vital stable.  Nursing concern of refusing most of the care and meds this morning.  Recorded UOP of 2700.  Renal function stable.  Troponin with a flat curve.  3/21: Hemodynamically stable.  Echocardiogram with low normal EF, grade 2 diastolic dysfunction, mild global hypokinesis and severe valvular disease which include severe aortic stenosis and moderate to severe mitral regurgitation.  Patient follow-up with [facility] cardiology.  3/22: Patient remained stable, apparently some issues between DSS and his facility communication which is delaying discharge.  3/23: Remained hemodynamically stable.  Most likely now go back to his facility next week.  Assessment and Plan: * Acute diastolic (congestive) heart failure (HCC) Significantly elevated BNP although clinically appears euvolemic. Prior echocardiogram in March 2023 with normal EF and grade 1 diastolic dysfunction.   Initially received IV Lasix Strict I's and O's Status post furosemide 80 mg IV one-time dose per EDP -Switch to p.o. Lasix 40 mg daily  Essential hypertension Home lisinopril 5 mg daily resumed  Shortness of breath I suspect this is secondary to heart failure exacerbation Low clinical suspicion for infectious etiology at this time as patient does not have leukocytosis, fever, or procalcitonin  Elevated troponin Suspect secondary to acute exacerbation of heart failure and causing demand ischemia  Dyslipidemia Atorvastatin 80 mg nightly resumed      Subjective: Patient was more alert and awake today.  Oriented to name only.  No new complaints.  Physical Exam: Vitals:   06/08/22 2300 06/09/22 0300 06/09/22 0741 06/09/22 0829  BP: 103/69 111/82 108/69   Pulse: 73 74 64   Resp: 19 17 16    Temp: 98 F (36.7 C) 99.3 F (37.4 C) 98.5 F (36.9 C)   TempSrc: Axillary Oral Oral   SpO2: 100% 100% 100% 95%  Weight:      Height:       General.  Emaciated elderly man, in no acute distress. Pulmonary.  Lungs clear bilaterally, normal respiratory effort. CV.  Regular rate and rhythm, no JVD, rub or murmur. Abdomen.  Soft, nontender, nondistended, BS positive. CNS.  Alert and oriented to name only.  No focal neurologic deficit. Extremities.  No edema, no cyanosis, pulses intact and symmetrical. Psychiatry.  Judgment and insight appears impaired.   Data Reviewed: Prior data reviewed  Family Communication:   Disposition: Status is: Inpatient Remains inpatient appropriate because: Severity of illness  Planned Discharge Destination: Skilled nursing facility  DVT prophylaxis.  Lovenox Time spent: 39 minutes  This  record has been created using Systems analyst. Errors have been sought and corrected,but may not always be located. Such creation errors do not reflect on the standard of care.   Author: Lorella Nimrod, MD 06/09/2022 1:14 PM  For on call review  [email or web address]

## 2022-06-10 DIAGNOSIS — I10 Essential (primary) hypertension: Secondary | ICD-10-CM | POA: Diagnosis not present

## 2022-06-10 DIAGNOSIS — R7989 Other specified abnormal findings of blood chemistry: Secondary | ICD-10-CM | POA: Diagnosis not present

## 2022-06-10 DIAGNOSIS — R0602 Shortness of breath: Secondary | ICD-10-CM | POA: Diagnosis not present

## 2022-06-10 DIAGNOSIS — I5031 Acute diastolic (congestive) heart failure: Secondary | ICD-10-CM | POA: Diagnosis not present

## 2022-06-10 LAB — CULTURE, BLOOD (ROUTINE X 2)
Culture: NO GROWTH
Culture: NO GROWTH
Special Requests: ADEQUATE
Special Requests: ADEQUATE

## 2022-06-10 LAB — BASIC METABOLIC PANEL
Anion gap: 5 (ref 5–15)
BUN: 29 mg/dL — ABNORMAL HIGH (ref 8–23)
CO2: 27 mmol/L (ref 22–32)
Calcium: 8.3 mg/dL — ABNORMAL LOW (ref 8.9–10.3)
Chloride: 102 mmol/L (ref 98–111)
Creatinine, Ser: 0.78 mg/dL (ref 0.61–1.24)
GFR, Estimated: >60 mL/min (ref >60)
Glucose, Bld: 99 mg/dL (ref 70–99)
Potassium: 4.6 mmol/L (ref 3.5–5.1)
Sodium: 134 mmol/L — ABNORMAL LOW (ref 135–145)

## 2022-06-10 MED ORDER — NICOTINE 14 MG/24HR TD PT24
14.0000 mg | MEDICATED_PATCH | Freq: Every day | TRANSDERMAL | Status: DC
  Administered 2022-06-10 – 2022-06-13 (×4): 14 mg via TRANSDERMAL
  Filled 2022-06-10 (×4): qty 1

## 2022-06-10 NOTE — Progress Notes (Signed)
Progress Note   Patient: Adam Good [id number] DOB: [birth year] DOA: 06/05/2022     5 DOS: the patient was seen and examined on 06/10/2022   Brief hospital course: Mr. 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  At baseline patient is aware 4 L nasal cannula all the time.  Vitals in the ED showed temperature of 97.6, respiration rate of 26, heart rate 78, blood pressure 114/87, SpO2 of 94% on 4 L nasal cannula.  Serum sodium is 140, potassium 4.4, chloride 109, bicarb 24, BUN of 19, serum creatinine of 0.75, EGFR greater than 60, nonfasting blood glucose 124, WBC 5, hemoglobin 11.6, platelets of 147.  High sensitive troponin was 102.  BNP was 2493.  ED treatment: Furosemide 80 mg IV one-time dose, cefepime 2 g IV, vancomycin IV  3/20: Vital stable.  Nursing concern of refusing most of the care and meds this morning.  Recorded UOP of 2700.  Renal function stable.  Troponin with a flat curve.  3/21: Hemodynamically stable.  Echocardiogram with low normal EF, grade 2 diastolic dysfunction, mild global hypokinesis and severe valvular disease which include severe aortic stenosis and moderate to severe mitral regurgitation.  Patient [facility]up with [facility] cardiology.  3/22: Patient remained stable, apparently some issues between DSS and his facility communication which is delaying discharge.  3/23: Remained hemodynamically stable.  Most likely now go back to his facility next week.  3/24: No new concern.  Asking for cigarettes, nicotine patch ordered  Assessment and Plan: * Acute diastolic (congestive) heart failure (HCC) Significantly elevated BNP although clinically appears euvolemic. Prior  echocardiogram in March 2023 with normal EF and grade 1 diastolic dysfunction.  Initially received IV Lasix Strict I's and O's Status post furosemide 80 mg IV one-time dose per EDP -Switch to p.o. Lasix 40 mg daily  Essential hypertension Home lisinopril 5 mg daily resumed  Shortness of breath I suspect this is secondary to heart failure exacerbation Low clinical suspicion for infectious etiology at this time as patient does not have leukocytosis, fever, or procalcitonin  Elevated troponin Suspect secondary to acute exacerbation of heart failure and causing demand ischemia  Dyslipidemia Atorvastatin 80 mg nightly resumed      Subjective: Patient with no new complaints.  Asking for some cigarettes.  Physical Exam: Vitals:   06/09/22 2320 06/10/22 0445 06/10/22 0736 06/10/22 1226  BP: 131/85 (!) 134/99 (!) 143/108 (!) 135/96  Pulse: 72 81 78 75  Resp: 16 18 16 16   Temp: 98.3 F (36.8 C) 97.6 F (36.4 C) (!) 97.4 F (36.3 C) 97.8 F (36.6 C)  TempSrc:      SpO2: 98% 95% 100% 97%  Weight:      Height:       General.  Frail and emaciated elderly man, in no acute distress. Pulmonary.  Lungs clear bilaterally, normal respiratory effort. CV.  Regular rate and rhythm, no JVD, rub or murmur. Abdomen.  Soft, nontender, nondistended, BS positive. CNS.  Alert and oriented to self only.  No focal neurologic deficit. Extremities.  No edema, no cyanosis, pulses intact and symmetrical. Psychiatry.  Judgment and insight appears impaired  Data Reviewed: Prior data reviewed  Family Communication:   Disposition: Status is: Inpatient Remains inpatient appropriate because: Severity of illness  Planned Discharge Destination:  Skilled nursing facility  DVT prophylaxis.  Lovenox Time spent: 38 minutes  This record has been created using Systems analyst. Errors have been sought and corrected,but may not always be located. Such creation errors do not reflect on the  standard of care.   Author: Lorella Nimrod, MD 06/10/2022 2:12 PM  For on call review [email or web address].

## 2022-06-11 DIAGNOSIS — I10 Essential (primary) hypertension: Secondary | ICD-10-CM | POA: Diagnosis not present

## 2022-06-11 DIAGNOSIS — I5031 Acute diastolic (congestive) heart failure: Secondary | ICD-10-CM | POA: Diagnosis not present

## 2022-06-11 DIAGNOSIS — R0602 Shortness of breath: Secondary | ICD-10-CM | POA: Diagnosis not present

## 2022-06-11 DIAGNOSIS — R7989 Other specified abnormal findings of blood chemistry: Secondary | ICD-10-CM | POA: Diagnosis not present

## 2022-06-11 NOTE — Progress Notes (Signed)
Progress Note   Patient: Adam Good [id number] DOB: 04-01-1945 DOA: 06/05/2022     6 DOS: the patient was seen and examined on 06/11/2022   Brief hospital course: Mr. 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  At baseline patient is aware 4 L nasal cannula all the time.  Vitals in the ED showed temperature of 97.6, respiration rate of 26, heart rate 78, blood pressure 114/87, SpO2 of 94% on 4 L nasal cannula.  Serum sodium is 140, potassium 4.4, chloride 109, bicarb 24, BUN of 19, serum creatinine of 0.75, EGFR greater than 60, nonfasting blood glucose 124, WBC 5, hemoglobin 11.6, platelets of 147.  High sensitive troponin was 102.  BNP was 2493.  ED treatment: Furosemide 80 mg IV one-time dose, cefepime 2 g IV, vancomycin IV  3/20: Vital stable.  Nursing concern of refusing most of the care and meds this morning.  Recorded UOP of 2700.  Renal function stable.  Troponin with a flat curve.  3/21: Hemodynamically stable.  Echocardiogram with low normal EF, grade 2 diastolic dysfunction, mild global hypokinesis and severe valvular disease which include severe aortic stenosis and moderate to severe mitral regurgitation.  Patient follow-up with [facility] cardiology.  3/22: Patient remained stable, apparently some issues between DSS and his facility communication which is delaying discharge.  3/23: Remained hemodynamically stable.  Most likely now go back to his facility next week.  3/24: No new concern.  Asking for cigarettes, nicotine patch ordered.  3/25: Medically stable, discharge is being delayed for many days due to not proper communication between [place] and [facility].  Assessment and Plan: *  Acute diastolic (congestive) heart failure (HCC) Significantly elevated BNP although clinically appears euvolemic. Prior echocardiogram in March 2023 with normal EF and grade 1 diastolic dysfunction.  Initially received IV Lasix Strict I's and O's Status post furosemide 80 mg IV one-time dose per EDP -Switch to p.o. Lasix 40 mg daily  Essential hypertension Home lisinopril 5 mg daily resumed  Shortness of breath I suspect this is secondary to heart failure exacerbation Low clinical suspicion for infectious etiology at this time as patient does not have leukocytosis, fever, or procalcitonin  Elevated troponin Suspect secondary to acute exacerbation of heart failure and causing demand ischemia  Dyslipidemia Atorvastatin 80 mg nightly resumed      Subjective: Patient was eating breakfast during morning rounds.  No new complaints.  Physical Exam: Vitals:   06/11/22 0023 06/11/22 0406 06/11/22 0759 06/11/22 1151  BP: 115/63 (!) 128/98 115/87 128/89  Pulse: 100 100 68 72  Resp: 18 20 16 16   Temp: 97.7 F (36.5 C) 97.6 F (36.4 C) 97.8 F (36.6 C) 98.6 F (37 C)  TempSrc: Oral Oral    SpO2: 95% 95% 94% 100%  Weight: 46.8 kg     Height:       General.  Frail and severely malnourished elderly man, in no acute distress. Pulmonary.  Lungs clear bilaterally, normal respiratory effort. CV.  Regular rate and rhythm, no JVD, rub or murmur. Abdomen.  Soft, nontender, nondistended, BS positive. CNS.  Alert and oriented to self only.  No focal neurologic deficit. Extremities.  No edema, no cyanosis, pulses intact and symmetrical. Psychiatry.  Judgment and insight appears impaired l.   Data Reviewed:  Prior data reviewed  Family Communication: TOC is communicating with DSS  Disposition: Status is: Inpatient Remains inpatient appropriate because: Severity of illness  Planned Discharge Destination: Skilled nursing facility  DVT prophylaxis.  Lovenox Time spent: 35  minutes  This record has been created using Systems analyst. Errors have been sought and corrected,but may not always be located. Such creation errors do not reflect on the standard of care.   Author: Lorella Nimrod, MD 06/11/2022 3:10 PM  For on call review [email or web address].

## 2022-06-11 NOTE — TOC Progression Note (Addendum)
Transition of Care [facility]) - Progression Note    Patient Details  Name: MIRL TROCCOLI MRN: [id number] Date of Birth: 06/02/1945  Transition of Care [facility]) CM/SW Contact  Laurena Slimmer, RN Phone Number: 06/11/2022, 10:53 AM  Clinical Narrative:    Spoke with Tammy at [facility] regarding bed. Patient's bed is still available however DSS has not made the payment required nor submitted paperwork for his return.   Spoke with Chrissy at [facility]. She stated she was unabel to reach anyone at [place]. She has left messages and will reattempt [facility] today.   MD notified.   3:10pm Retrieved message from [name] from [place] that payment has been made and it should post to [facility]'s account by tomorrow.  Call placed to Tammy at [facility]. Payment not verified by business office at this time.         Expected Discharge Plan and Services                                               Social Determinants of Health (SDOH) Interventions SDOH Screenings   Tobacco Use: High Risk (06/05/2022)    Readmission Risk Interventions    04/16/2022   11:25 AM  Readmission Risk Prevention Plan  Transportation Screening Complete  Social Work Consult for [place] Planning/Counseling Complete  Palliative Care Screening Not Applicable  Medication Review Press photographer) Complete

## 2022-06-12 DIAGNOSIS — R0602 Shortness of breath: Secondary | ICD-10-CM | POA: Diagnosis not present

## 2022-06-12 DIAGNOSIS — I10 Essential (primary) hypertension: Secondary | ICD-10-CM | POA: Diagnosis not present

## 2022-06-12 DIAGNOSIS — I5031 Acute diastolic (congestive) heart failure: Secondary | ICD-10-CM | POA: Diagnosis not present

## 2022-06-12 DIAGNOSIS — R7989 Other specified abnormal findings of blood chemistry: Secondary | ICD-10-CM | POA: Diagnosis not present

## 2022-06-12 NOTE — NC FL2 (Signed)
[place] LEVEL OF CARE FORM     IDENTIFICATION  Patient Name: Adam Good Birthdate: [birth year] Sex: male Admission Date (Current Location): 06/05/2022  [facility] and [place] Number:  Engineering geologist and Address:  [facility], [address], [place], [place] [address]      Provider Number: [id number]  Attending Physician Name and Address:  Lorella Nimrod, MD  Relative Name and Phone Number:  Cobb,Chrissy (Legal Guardian) [phone]    Current Level of Care: Hospital Recommended Level of Care: [place] Prior Approval Number:    Date Approved/Denied:   PASRR Number: [id number]  Discharge Plan: SNF    Current Diagnoses: Patient Active Problem List   Diagnosis Date Noted   Acute diastolic (congestive) heart failure ([place]) 06/05/2022   Shortness of breath 06/05/2022   Pressure injury of skin 04/18/2022   Senile dementia uncomp, without behavioral disturbance ([place]) [id number]   Metabolic acidosis [id number]   Duodenal ulceration 04/13/2022   Hypernatremia 03/27/2022   Normocytic anemia 03/27/2022   Encounter for monitoring androgen deprivation therapy 01/09/2022   Weakness of right lower extremity 11/01/2021   Back pain 11/01/2021   Prostate cancer metastatic to multiple sites [facility]) 10/26/2021   Alcohol abuse with uncomplicated intoxication ([place]) 03/25/2018   Elevated troponin 10/29/2017   Dyslipidemia    Essential hypertension    Alcohol abuse    Heart murmur    Demand ischemia of myocardium ([place]) 01/17/2014    Orientation RESPIRATION BLADDER Height & Weight      (Disoriented x 4)  O2 (Nasal cannula 4 L) Incontinent, Indwelling catheter Weight: 110 lb 7.2 oz (50.1 kg) Height:  5\' 8"  (172.7 cm)  BEHAVIORAL SYMPTOMS/MOOD NEUROLOGICAL BOWEL NUTRITION STATUS   (None)  (None) Incontinent Diet (2 gram sodium)  AMBULATORY STATUS COMMUNICATION OF NEEDS Skin   Limited Assist Verbally Skin abrasions,  Bruising, Other (Comment), PU Stage and Appropriate Care (Scratch marks. Non-pressure wound on bilateral mid lower scrotum: Foam prn.)   PU Stage 2 Dressing:  (Right medial anterior thigh: Foam.)                   Personal Care Assistance Level of Assistance  Bathing, Dressing Bathing Assistance: Limited assistance   Dressing Assistance: Limited assistance     Functional Limitations Info  Sight, Hearing, Speech Sight Info: Adequate Hearing Info: Adequate Speech Info: Adequate    SPECIAL CARE FACTORS FREQUENCY                       Contractures Contractures Info: Not present    Additional Factors Info  Code Status, Allergies Code Status Info: Full code Allergies Info: NKDA           Current Medications (06/12/2022):  This is the current hospital active medication list Current Facility-Administered Medications  Medication Dose Route Frequency Provider Last Rate Last Admin   alum & mag hydroxide-simeth (MAALOX/MYLANTA) 200-200-20 MG/5ML suspension 15 mL  15 mL Oral Q6H PRN Cox, Amy N, DO       ascorbic acid (VITAMIN C) tablet 500 mg  500 mg Oral BID Cox, Amy N, DO   500 mg at 06/11/22 2102   atorvastatin (LIPITOR) tablet 80 mg  80 mg Oral QHS Cox, Amy N, DO   80 mg at 06/11/22 2102   Chlorhexidine Gluconate Cloth 2 % PADS 6 each  6 each Topical Daily Cox, Amy N, DO   6 each at 06/11/22 1017   enoxaparin (  LOVENOX) injection 40 mg  40 mg Subcutaneous Q24H Cox, Amy N, DO   40 mg at 06/11/22 2102   feeding supplement (ENSURE ENLIVE / ENSURE PLUS) liquid 237 mL  237 mL Oral TID BM Cox, Amy N, DO   237 mL at 06/11/22 2052   furosemide (LASIX) tablet 40 mg  40 mg Oral Daily Lorella Nimrod, MD   40 mg at 06/11/22 1017   ipratropium-albuterol (DUONEB) 0.5-2.5 (3) MG/3ML nebulizer solution 3 mL  3 mL Nebulization Q4H PRN Cox, Amy N, DO       lisinopril (ZESTRIL) tablet 5 mg  5 mg Oral Daily Cox, Amy N, DO   5 mg at 06/11/22 1016   magnesium oxide (MAG-OX) tablet 400 mg  400 mg  Oral Daily Cox, Amy N, DO   400 mg at 06/11/22 1016   megestrol (MEGACE) tablet 20 mg  20 mg Oral Daily Cox, Amy N, DO   20 mg at 06/11/22 1016   mometasone-formoterol (DULERA) 200-5 MCG/ACT inhaler 2 puff  2 puff Inhalation BID Cox, Amy N, DO   2 puff at 06/11/22 2052   multivitamin with minerals tablet 1 tablet  1 tablet Oral Daily Cox, Amy N, DO   1 tablet at 06/11/22 1016   nicotine (NICODERM CQ - dosed in mg/24 hours) patch 14 mg  14 mg Transdermal Daily Lorella Nimrod, MD   14 mg at 06/11/22 1016   Oral care mouth rinse  15 mL Mouth Rinse PRN Cox, Amy N, DO       senna (SENOKOT) tablet 8.6 mg  1 tablet Oral BID Cox, Amy N, DO   8.6 mg at 06/11/22 2102   senna-docusate (Senokot-S) tablet 1 tablet  1 tablet Oral QHS PRN Cox, Amy N, DO       thiamine (VITAMIN B1) tablet 100 mg  100 mg Oral Daily Cox, Amy N, DO   100 mg at 06/11/22 1016     Discharge Medications: Please see discharge summary for a list of discharge medications.  Relevant Imaging Results:  Relevant Lab Results:   Additional Information SS#: [id number]  Candie Chroman, LCSW

## 2022-06-12 NOTE — Progress Notes (Signed)
Progress Note   Patient: Adam Good [id number] DOB: 02-20-1946 DOA: 06/05/2022     7 DOS: the patient was seen and examined on 06/12/2022   Brief hospital course: 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  At baseline patient is aware 4 L nasal cannula all the time.  Vitals in the ED showed temperature of 97.6, respiration rate of 26, heart rate 78, blood pressure 114/87, SpO2 of 94% on 4 L nasal cannula.  Serum sodium is 140, potassium 4.4, chloride 109, bicarb 24, BUN of 19, serum creatinine of 0.75, EGFR greater than 60, nonfasting blood glucose 124, WBC 5, hemoglobin 11.6, platelets of 147.  High sensitive troponin was 102.  BNP was 2493.  ED treatment: Furosemide 80 mg IV one-time dose, cefepime 2 g IV, vancomycin IV  3/20: Vital stable.  Nursing concern of refusing most of the care and meds this morning.  Recorded UOP of 2700.  Renal function stable.  Troponin with a flat curve.  3/21: Hemodynamically stable.  Echocardiogram with low normal EF, grade 2 diastolic dysfunction, mild global hypokinesis and severe valvular disease which include severe aortic stenosis and moderate to severe mitral regurgitation.  Patient follow-up with [facility] cardiology.  3/22: Patient remained stable, apparently some issues between DSS and his facility communication which is delaying discharge.  3/23: Remained hemodynamically stable.  Most likely now go back to his facility next week.  3/24: No new concern.  Asking for cigarettes, nicotine patch ordered.  3/25: Medically stable, discharge is being delayed for many days due to not proper communication between [place] and [facility].  3/26: Remained stable.   Still issue between [facility] and [facility] has not been completely resolved.  Assessment and Plan: * Acute diastolic (congestive) heart failure (HCC) Significantly elevated BNP although clinically appears euvolemic. Prior echocardiogram in March 2023 with normal EF and grade 1 diastolic dysfunction.  Initially received IV Lasix Strict I's and O's Status post furosemide 80 mg IV one-time dose per EDP -Switch to p.o. Lasix 40 mg daily  Essential hypertension Home lisinopril 5 mg daily resumed  Shortness of breath I suspect this is secondary to heart failure exacerbation Low clinical suspicion for infectious etiology at this time as patient does not have leukocytosis, fever, or procalcitonin  Elevated troponin Suspect secondary to acute exacerbation of heart failure and causing demand ischemia  Dyslipidemia Atorvastatin 80 mg nightly resumed      Subjective: Patient with no new complaints or concerns today.  Physical Exam: Vitals:   06/12/22 0351 06/12/22 0401 06/12/22 0801 06/12/22 1159  BP: 107/79  98/70 110/74  Pulse: 81  77 74  Resp: 18  16 16   Temp: 98 F (36.7 C)  97.8 F (36.6 C) 97.8 F (36.6 C)  TempSrc:      SpO2: 92%  100% 100%  Weight:  50.1 kg    Height:       General.  Frail and emaciated elderly man, in no acute distress. Pulmonary.  Lungs clear bilaterally, normal respiratory effort. CV.  Regular rate and rhythm, no JVD, rub or murmur. Abdomen.  Soft, nontender, nondistended, BS positive. CNS.  Alert and oriented to self.  No focal neurologic deficit. Extremities.  No edema, no cyanosis, pulses intact and symmetrical. Psychiatry.  Judgment and insight  appears impaired..   Data Reviewed: Prior data reviewed  Family Communication: TOC is communicating with DSS  Disposition: Status is: Inpatient Remains inpatient appropriate because: Severity of illness  Planned Discharge Destination: Skilled nursing facility  DVT prophylaxis.  Lovenox Time spent: 35  minutes  This record has been created using Systems analyst. Errors have been sought and corrected,but may not always be located. Such creation errors do not reflect on the standard of care.   Author: Lorella Nimrod, MD 06/12/2022 2:35 PM  For on call review [email or web address].

## 2022-06-12 NOTE — TOC Progression Note (Signed)
Transition of Care [facility]) - Progression Note    Patient Details  Name: Adam Good MRN: [id number] Date of Birth: November 16, 1945  Transition of Care [facility]) CM/SW [place], LCSW Phone Number: 06/12/2022, 3:10 PM  Clinical Narrative:  [facility] has not received payment yet. Guardian is waiting on check and will take it to the SNF once received.   Expected Discharge Plan and Services                                               Social Determinants of Health (SDOH) Interventions SDOH Screenings   Tobacco Use: High Risk (06/05/2022)    Readmission Risk Interventions    04/16/2022   11:25 AM  Readmission Risk Prevention Plan  Transportation Screening Complete  Social Work Consult for [place] Planning/Counseling Complete  Palliative Care Screening Not Applicable  Medication Review Press photographer) Complete

## 2022-06-12 NOTE — Plan of Care (Signed)

## 2022-06-13 DIAGNOSIS — I5031 Acute diastolic (congestive) heart failure: Secondary | ICD-10-CM | POA: Diagnosis not present

## 2022-06-13 MED ORDER — FUROSEMIDE 20 MG PO TABS: 20.0000 mg | ORAL_TABLET | Freq: Every day | ORAL | 1 refills | Status: DC

## 2022-06-13 MED ORDER — FUROSEMIDE 40 MG PO TABS: 40.0000 mg | ORAL_TABLET | Freq: Every day | ORAL | 0 refills | Status: DC

## 2022-06-13 NOTE — Plan of Care (Signed)
  Problem: Education: Goal: Knowledge of General Education information will improve Description: Including pain rating scale, medication(s)/side effects and non-pharmacologic comfort measures 06/13/2022 1010 by Shauna Hugh, RN Outcome: Adequate for Discharge 06/13/2022 0757 by Shauna Hugh, RN Outcome: Progressing   Problem: Health Behavior/Discharge Planning: Goal: Ability to manage health-related needs will improve 06/13/2022 1010 by Shauna Hugh, RN Outcome: Adequate for Discharge 06/13/2022 0757 by Shauna Hugh, RN Outcome: Progressing   Problem: Clinical Measurements: Goal: Ability to maintain clinical measurements within normal limits will improve 06/13/2022 1010 by Shauna Hugh, RN Outcome: Adequate for Discharge 06/13/2022 0757 by Shauna Hugh, RN Outcome: Progressing Goal: Will remain free from infection 06/13/2022 1010 by Shauna Hugh, RN Outcome: Adequate for Discharge 06/13/2022 0757 by Shauna Hugh, RN Outcome: Progressing Goal: Diagnostic test results will improve 06/13/2022 1010 by Shauna Hugh, RN Outcome: Adequate for Discharge 06/13/2022 0757 by Shauna Hugh, RN Outcome: Progressing Goal: Respiratory complications will improve 06/13/2022 1010 by Shauna Hugh, RN Outcome: Adequate for Discharge 06/13/2022 0757 by Shauna Hugh, RN Outcome: Progressing Goal: Cardiovascular complication will be avoided 06/13/2022 1010 by Shauna Hugh, RN Outcome: Adequate for Discharge 06/13/2022 0757 by Shauna Hugh, RN Outcome: Progressing   Problem: Activity: Goal: Risk for activity intolerance will decrease 06/13/2022 1010 by Shauna Hugh, RN Outcome: Adequate for Discharge 06/13/2022 0757 by Shauna Hugh, RN Outcome: Progressing   Problem: Nutrition: Goal: Adequate nutrition will be maintained 06/13/2022 1010 by Shauna Hugh, RN Outcome: Adequate for Discharge 06/13/2022 0757 by Shauna Hugh, RN Outcome: Progressing   Problem: Coping: Goal: Level of anxiety  will decrease 06/13/2022 1010 by Shauna Hugh, RN Outcome: Adequate for Discharge 06/13/2022 0757 by Shauna Hugh, RN Outcome: Progressing   Problem: Elimination: Goal: Will not experience complications related to bowel motility 06/13/2022 1010 by Shauna Hugh, RN Outcome: Adequate for Discharge 06/13/2022 0757 by Shauna Hugh, RN Outcome: Progressing Goal: Will not experience complications related to urinary retention 06/13/2022 1010 by Shauna Hugh, RN Outcome: Adequate for Discharge 06/13/2022 0757 by Shauna Hugh, RN Outcome: Progressing   Problem: Pain Managment: Goal: General experience of comfort will improve 06/13/2022 1010 by Shauna Hugh, RN Outcome: Adequate for Discharge 06/13/2022 0757 by Shauna Hugh, RN Outcome: Progressing   Problem: Safety: Goal: Ability to remain free from injury will improve 06/13/2022 1010 by Shauna Hugh, RN Outcome: Adequate for Discharge 06/13/2022 0757 by Shauna Hugh, RN Outcome: Progressing   Problem: Skin Integrity: Goal: Risk for impaired skin integrity will decrease 06/13/2022 1010 by Shauna Hugh, RN Outcome: Adequate for Discharge 06/13/2022 0757 by Shauna Hugh, RN Outcome: Progressing

## 2022-06-13 NOTE — Care Management Important Message (Signed)
Important Message  Patient Details  Name: Adam Good MRN: [id number] Date of Birth: 17-Sep-1945   Medicare Important Message Given:  Yes  Reviewed Medicare IM with Nicki Reaper, [place] legal guardian, at  [phone].  Copy of Medicare IM faxed to Chrissy's attention at [phone].     Dannette Barbara 06/13/2022, 9:05 AM

## 2022-06-13 NOTE — TOC Transition Note (Signed)
Transition of Care [facility]) - CM/SW Discharge Note   Patient Details  Name: Adam Good MRN: [id number] Date of Birth: 06/25/1945  Transition of Care [facility]) CM/SW Contact:  Candie Chroman, LCSW Phone Number: 06/13/2022, 10:33 AM   Clinical Narrative: Patient has orders to discharge to [facility] today. RN has already called report. EMS transport has been arranged and he is next on the list. No further concerns. CSW signing off.    Final next level of care: Skilled Nursing Facility Barriers to Discharge: Barriers Resolved   Patient Goals and CMS Choice      Discharge Placement     Existing PASRR number confirmed : 06/12/22          Patient chooses bed at: [facility] [place] Patient to be transferred to facility by: EMS Name of family member notified: [name] Patient and family notified of of transfer: 06/13/22  Discharge Plan and Services Additional resources added to the After Visit Summary for                                       Social Determinants of Health (SDOH) Interventions SDOH Screenings   Tobacco Use: High Risk (06/05/2022)     Readmission Risk Interventions    04/16/2022   11:25 AM  Readmission Risk Prevention Plan  Transportation Screening Complete  Social Work Consult for [place] Planning/Counseling Complete  Palliative Care Screening Not Applicable  Medication Review Press photographer) Complete

## 2022-06-13 NOTE — Discharge Summary (Signed)
Adam Good [id number] DOB: [birth year] DOA: 06/05/2022  PCP: Terrill Mohr, NP  Admit date: 06/05/2022 Discharge date: 06/13/2022  Time spent: 35 minutes  Recommendations for Outpatient Follow-up:  PCP f/u 1 week F/u cardiology ([place]) BMP in one week to assess kidney function and electrolytes    Discharge Diagnoses:  Principal Problem:   Acute diastolic (congestive) heart failure (HCC) Active Problems:   Essential hypertension   Shortness of breath   Elevated troponin   Dyslipidemia   Discharge Condition: stable  Diet recommendation: heart healthy  Filed Weights   06/08/22 0400 06/11/22 0023 06/12/22 0401  Weight: 46.3 kg 46.8 kg 50.1 kg    History of present illness:  From admission h and p Adam Good is a 77 year old male with hyperlipidemia, GERD, 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   At baseline patient is aware 4 L nasal cannula all the time.   At bedside, patient was able to tell me his name, his age of 27.   He was not able to tell me the current location, he states he slipped here a long time ago.  He was not able to tell me the current calendar year or the current month.   He denies being in pain, short of breath, nausea, vomiting, abdominal pain, dysuria, hematuria, diarrhea.   He does not appear to be in acute distress.   Social history: Patient is a ward of the state and lives at [facility].   ROS: Unable to complete as patient likely has dementia  Hospital Course:  Patient was admitted with shortness of breath. Not found to have a new oxygen requirement. TTE with low-normal EF, grade 2 dd, severe aortic stenosis and mitral regurg. Per prior hospital providers he follows with cardiology at the [facility]. Here he was  treated with one dose of IV lasix and then was started on oral lasix. His respiratory status appears to have returned to baseline. He was medically stable for discharge for several days before facility and DSS resolved some administrative roadblocks and discharge was approved. Advise f/u with pcp and [place] cardiology, will need a check of kidney function and electrolytes in one week.   Procedures: none  Consultations: none  Discharge Exam: Vitals:   06/13/22 0520 06/13/22 0752  BP: (!) 140/95 (!) 134/98  Pulse: 93 88  Resp: 20 18  Temp: 98.6 F (37 C) 97.8 F (36.6 C)  SpO2: 100% 97%    General: NAD Cardiovascular: systolic and diastolic murmurs, rr Respiratory: rales at bases otherwise clear Ext: no edema  Discharge Instructions   Discharge Instructions     Diet - low sodium heart healthy   Complete by: As directed    Discharge wound care:   Complete by: As directed    Keep clean and dry and dressed, frequent repositioning   Increase activity slowly   Complete by: As directed       Allergies as of 06/13/2022   No Known Allergies      Medication List     TAKE these medications    acetaminophen 500 MG tablet Commonly known as: TYLENOL Take 500 mg by mouth every 6 (six) hours as needed.   Advair Diskus 250-50 MCG/ACT Aepb Generic drug: fluticasone-salmeterol Inhale 1 puff into the lungs 2 (two) times daily.   alum & mag hydroxide-simeth  200-200-20 MG/5ML suspension Commonly known as: MAALOX/MYLANTA Take by mouth every 6 (six) hours as needed for indigestion or heartburn.   ascorbic acid 500 MG tablet Commonly known as: VITAMIN C Take 500 mg by mouth 2 (two) times daily.   atorvastatin 80 MG tablet Commonly known as: LIPITOR Take 80 mg by mouth at bedtime.   feeding supplement Liqd Take 237 mLs by mouth 3 (three) times daily between meals.   furosemide 20 MG tablet Commonly known as: LASIX Take 1 tablet (20 mg total) by mouth daily. Start taking  on: June 14, 2022   ipratropium-albuterol 0.5-2.5 (3) MG/3ML Soln Commonly known as: DUONEB Take 3 mLs by nebulization every 4 (four) hours as needed.   lisinopril 5 MG tablet Commonly known as: ZESTRIL Take 5 mg by mouth daily.   loperamide 2 MG capsule Commonly known as: IMODIUM Take 2 mg by mouth 4 (four) times daily as needed for diarrhea or loose stools.   Magnesium 200 MG Tabs Take 1 tablet by mouth daily.   magnesium hydroxide 400 MG/5ML suspension Commonly known as: MILK OF MAGNESIA Take 30 mLs by mouth daily as needed for mild constipation.   megestrol 20 MG tablet Commonly known as: MEGACE Take 20 mg by mouth daily.   multivitamin with minerals Tabs tablet Take 1 tablet by mouth daily.   pantoprazole 40 MG tablet Commonly known as: Protonix Take 1 tablet (40 mg total) by mouth 2 (two) times daily for 30 days, THEN 1 tablet (40 mg total) daily. Start taking on: April 18, 2022   polyethylene glycol 17 g packet Commonly known as: MIRALAX / GLYCOLAX Take 17 g by mouth daily.   senna 8.6 MG Tabs tablet Commonly known as: SENOKOT Take 1 tablet by mouth in the morning and at bedtime.   thiamine 100 MG tablet Commonly known as: Vitamin B-1 Take 100 mg by mouth daily.   Xtandi 40 MG tablet Generic drug: enzalutamide Take 4 tablets (160 mg total) by mouth daily.               Discharge Care Instructions  (From admission, onward)           Start     Ordered   06/13/22 0000  Discharge wound care:       Comments: Keep clean and dry and dressed, frequent repositioning   06/13/22 0926           No Known Allergies  Contact information for follow-up providers     Terrill Mohr, NP. Schedule an appointment as soon as possible for a visit in 1 week(s).   Specialty: Nurse Practitioner Contact information: [address] [place] [address] [phone]              Contact information for after-discharge care      Destination     [facility] Preferred SNF .   Service: Skilled Nursing Contact information: [address] [place] [phone]                      The results of significant diagnostics from this hospitalization (including imaging, microbiology, ancillary and laboratory) are listed below for reference.    Significant Diagnostic Studies: ECHOCARDIOGRAM COMPLETE  Result Date: 06/07/2022    ECHOCARDIOGRAM REPORT   Patient Name:   KALEAB BERLEY Fleming Date of Exam: 06/07/2022 Medical Rec #:  [id number]        Height:  68.0 in Accession #:    [id number]       Weight:       113.8 lb Date of Birth:  02-May-1945        BSA:          1.608 m Patient Age:    8 years         BP:           111/78 mmHg Patient Gender: M                HR:           74 bpm. Exam Location:  [facility] Procedure: 2D Echo, Cardiac Doppler and Color Doppler Indications:     Abnormal ECG  History:         Patient has prior history of Echocardiogram examinations, most                  recent 06/15/2021. CHF, Signs/Symptoms:Murmur and Shortness of                  Breath; Risk Factors:Hypertension, Dyslipidemia and Current                  Smoker. ETOH abuse, Dementia.  Sonographer:     Wenda Low Referring Phys:  [id number] [facility] AMIN Diagnosing Phys: Neoma Laming  Sonographer Comments: Technically difficult study due to poor echo windows. Image acquisition challenging due to patient body habitus. IMPRESSIONS  1. Left ventricular ejection fraction, by estimation, is 50 to 55%. The left ventricle has low normal function. The left ventricle demonstrates global hypokinesis. The left ventricular internal cavity size was mildly to moderately dilated. There is moderate concentric left ventricular hypertrophy. Left ventricular diastolic parameters are consistent with Grade II diastolic dysfunction (pseudonormalization).  2. Right ventricular systolic function is low normal. The right  ventricular size is mildly enlarged. Moderately increased right ventricular wall thickness. There is mildly elevated pulmonary artery systolic pressure.  3. Left atrial size was severely dilated.  4. Right atrial size was severely dilated.  5. The mitral valve is myxomatous. Severe mitral valve regurgitation.  6. Tricuspid valve regurgitation is moderate.  7. The aortic valve is calcified. Aortic valve regurgitation is mild. Severe aortic valve stenosis. Conclusion(s)/Recommendation(s): Findings consistent with severe valvular heart disease. FINDINGS  Left Ventricle: Left ventricular ejection fraction, by estimation, is 50 to 55%. The left ventricle has low normal function. The left ventricle demonstrates global hypokinesis. The left ventricular internal cavity size was mildly to moderately dilated. There is moderate concentric left ventricular hypertrophy. Left ventricular diastolic parameters are consistent with Grade II diastolic dysfunction (pseudonormalization). Right Ventricle: The right ventricular size is mildly enlarged. Moderately increased right ventricular wall thickness. Right ventricular systolic function is low normal. There is mildly elevated pulmonary artery systolic pressure. The tricuspid regurgitant velocity is 2.76 m/s, and with an assumed right atrial pressure of 8 mmHg, the estimated right ventricular systolic pressure is [id number] mmHg. Left Atrium: Left atrial size was severely dilated. Right Atrium: Right atrial size was severely dilated. Pericardium: There is no evidence of pericardial effusion. Mitral Valve: The mitral valve is myxomatous. Severe mitral valve regurgitation. MV peak gradient, 3.3 mmHg. The mean mitral valve gradient is 1.0 mmHg. Tricuspid Valve: The tricuspid valve is grossly normal. Tricuspid valve regurgitation is moderate. Aortic Valve: The aortic valve is calcified. Aortic valve regurgitation is mild. Severe aortic stenosis is present. Aortic valve mean gradient measures  43.7 mmHg. Aortic valve peak gradient measures 78.9 mmHg. Aortic  valve area, by VTI measures 0.76 cm. Pulmonic Valve: The pulmonic valve was normal in structure. Pulmonic valve regurgitation is trivial. Aorta: The aortic root, ascending aorta and aortic arch are all structurally normal, with no evidence of dilitation or obstruction. IAS/Shunts: No atrial level shunt detected by color flow Doppler.  LEFT VENTRICLE PLAX 2D LVIDd:         3.60 cm   Diastology LVIDs:         2.70 cm   LV e' medial:    6.53 cm/s LV PW:         1.30 cm   LV E/e' medial:  12.2 LV IVS:        1.40 cm   LV e' lateral:   9.03 cm/s LVOT diam:     2.00 cm   LV E/e' lateral: 8.8 LV SV:         83 LV SV Index:   52 LVOT Area:     3.14 cm  RIGHT VENTRICLE RV Basal diam:  3.45 cm RV Mid diam:    3.10 cm RV S prime:     7.94 cm/s TAPSE (M-mode): 1.5 cm LEFT ATRIUM           Index        RIGHT ATRIUM           Index LA diam:      4.20 cm 2.61 cm/m   RA Area:     19.90 cm LA Vol (A4C): 89.1 ml 55.41 ml/m  RA Volume:   59.80 ml  37.19 ml/m  AORTIC VALVE                     PULMONIC VALVE AV Area (Vmax):    0.79 cm      PV Vmax:       1.03 m/s AV Area (Vmean):   0.71 cm      PV Peak grad:  4.2 mmHg AV Area (VTI):     0.76 cm AV Vmax:           444.00 cm/s AV Vmean:          304.667 cm/s AV VTI:            1.090 m AV Peak Grad:      78.9 mmHg AV Mean Grad:      43.7 mmHg LVOT Vmax:         111.00 cm/s LVOT Vmean:        68.700 cm/s LVOT VTI:          0.265 m LVOT/AV VTI ratio: 0.24  AORTA Ao Root diam: 3.20 cm Ao Asc diam:  4.00 cm MITRAL VALVE                  TRICUSPID VALVE MV Area (PHT): 5.13 cm       TR Peak grad:   30.5 mmHg MV Area VTI:   4.18 cm       TR Vmax:        276.00 cm/s MV Peak grad:  3.3 mmHg MV Mean grad:  1.0 mmHg       SHUNTS MV Vmax:       0.90 m/s       Systemic VTI:  0.26 m MV Vmean:      40.3 cm/s      Systemic Diam: 2.00 cm MV Decel Time: 148 msec MR Peak grad:    158.3 mmHg MR Mean grad:  102.0 mmHg MR Vmax:          629.00 cm/s MR Vmean:        470.0 cm/s MR PISA:         5.67 cm MR PISA Eff ROA: 83 mm MR PISA Radius:  0.95 cm MV E velocity: 79.80 cm/s MV A velocity: 38.40 cm/s MV E/A ratio:  2.08 Shaukat Khan Electronically signed by Neoma Laming Signature Date/Time: 06/07/2022/3:37:13 PM    Final    DG Chest 2 View  Result Date: 06/05/2022 CLINICAL DATA:  sob EXAM: CHEST - 2 VIEW COMPARISON:  Chest x-ray 09/26/2018 FINDINGS: Small left pleural effusion. Overlying left basilar and mid lung opacities. Mild diffuse interstitial prominence. No visible pneumothorax. Cardiomediastinal silhouette is similar. IMPRESSION: 1. Small left pleural effusion. Overlying left basilar and mid lung opacities could represent atelectasis, aspiration, and/or pneumonia. Followup PA and lateral chest X-ray is recommended in 3-4 weeks to ensure resolution and exclude underlying malignancy. 2. Mild diffuse interstitial prominence, possibly mild interstitial edema. Electronically Signed   By: Margaretha Sheffield M.D.   On: 06/05/2022 16:23    Microbiology: Recent Results (from the past 240 hour(s))  Resp panel by RT-PCR (RSV, Flu A&B, Covid) Anterior Nasal Swab     Status: None   Collection Time: 06/05/22  5:54 PM   Specimen: Anterior Nasal Swab  Result Value Ref Range Status   SARS Coronavirus 2 by RT PCR NEGATIVE NEGATIVE Final    Comment: (NOTE) SARS-CoV-2 target nucleic acids are NOT DETECTED.  The SARS-CoV-2 RNA is generally detectable in upper respiratory specimens during the acute phase of infection. The lowest concentration of SARS-CoV-2 viral copies this assay can detect is 138 copies/mL. A negative result does not preclude SARS-Cov-2 infection and should not be used as the sole basis for treatment or other patient management decisions. A negative result may occur with  improper specimen collection/handling, submission of specimen other than nasopharyngeal swab, presence of viral mutation(s) within the areas targeted by  this assay, and inadequate number of viral copies(<138 copies/mL). A negative result must be combined with clinical observations, patient history, and epidemiological information. The expected result is Negative.  Fact Sheet for Patients:  EntrepreneurPulse.com.au  Fact Sheet for Healthcare Providers:  IncredibleEmployment.be  This test is no t yet approved or cleared by the [place] FDA and  has been authorized for detection and/or diagnosis of SARS-CoV-2 by FDA under an Emergency Use Authorization (EUA). This EUA will remain  in effect (meaning this test can be used) for the duration of the COVID-19 declaration under Section 564(b)(1) of the Act, 21 U.S.C.section 360bbb-3(b)(1), unless the authorization is terminated  or revoked sooner.       Influenza A by PCR NEGATIVE NEGATIVE Final   Influenza B by PCR NEGATIVE NEGATIVE Final    Comment: (NOTE) The Xpert Xpress SARS-CoV-2/FLU/RSV plus assay is intended as an aid in the diagnosis of influenza from Nasopharyngeal swab specimens and should not be used as a sole basis for treatment. Nasal washings and aspirates are unacceptable for Xpert Xpress SARS-CoV-2/FLU/RSV testing.  Fact Sheet for Patients: EntrepreneurPulse.com.au  Fact Sheet for Healthcare Providers: IncredibleEmployment.be  This test is not yet approved or cleared by the [place] FDA and has been authorized for detection and/or diagnosis of SARS-CoV-2 by FDA under an Emergency Use Authorization (EUA). This EUA will remain in effect (meaning this test can be used) for the duration of the COVID-19 declaration under Section 564(b)(1) of the Act, 21 U.S.C. section 360bbb-3(b)(1),  unless the authorization is terminated or revoked.     Resp Syncytial Virus by PCR NEGATIVE NEGATIVE Final    Comment: (NOTE) Fact Sheet for Patients: EntrepreneurPulse.com.au  Fact Sheet  for Healthcare Providers: IncredibleEmployment.be  This test is not yet approved or cleared by the [place] FDA and has been authorized for detection and/or diagnosis of SARS-CoV-2 by FDA under an Emergency Use Authorization (EUA). This EUA will remain in effect (meaning this test can be used) for the duration of the COVID-19 declaration under Section 564(b)(1) of the Act, 21 U.S.C. section 360bbb-3(b)(1), unless the authorization is terminated or revoked.  Performed at [facility], [address] [place], [place] [address]   Culture, blood (routine x 2)     Status: None   Collection Time: 06/05/22  7:05 PM   Specimen: BLOOD  Result Value Ref Range Status   Specimen Description BLOOD BLOOD RIGHT HAND  Final   Special Requests   Final    BOTTLES DRAWN AEROBIC AND ANAEROBIC Blood Culture adequate volume   Culture   Final    NO GROWTH 5 DAYS Performed at [facility], [address], [place], [place] [address]    Report Status 06/10/2022 FINAL  Final  Culture, blood (routine x 2)     Status: None   Collection Time: 06/05/22  7:20 PM   Specimen: BLOOD  Result Value Ref Range Status   Specimen Description BLOOD BLOOD RIGHT WRIST  Final   Special Requests   Final    BOTTLES DRAWN AEROBIC AND ANAEROBIC Blood Culture adequate volume   Culture   Final    NO GROWTH 5 DAYS Performed at [facility], [address] [place], [place] [address]    Report Status 06/10/2022 FINAL  Final     Labs: Basic Metabolic Panel: Recent Labs  Lab 06/07/22 0432 06/08/22 0413 06/09/22 0540 06/10/22 0509  NA 139 144 137 134*  K 4.6 4.5 4.6 4.6  CL 102 100 102 102  CO2 32 32 30 27  GLUCOSE 71 91 87 99  BUN 29* 36* 32* 29*  CREATININE 1.13 0.99 0.86 0.78  CALCIUM 7.9* 8.4* 8.1* 8.3*   Liver Function Tests: No results for input(s): "AST", "ALT", "ALKPHOS", "BILITOT", "PROT", "ALBUMIN" in the last 168 hours. No results for input(s):  "LIPASE", "AMYLASE" in the last 168 hours. No results for input(s): "AMMONIA" in the last 168 hours. CBC: No results for input(s): "WBC", "NEUTROABS", "HGB", "HCT", "MCV", "PLT" in the last 168 hours. Cardiac Enzymes: No results for input(s): "CKTOTAL", "CKMB", "CKMBINDEX", "TROPONINI" in the last 168 hours. BNP: BNP (last 3 results) Recent Labs    06/05/22 1602  BNP 2,493.4*    ProBNP (last 3 results) No results for input(s): "PROBNP" in the last 8760 hours.  CBG: No results for input(s): "GLUCAP" in the last 168 hours.     Signed:  Desma Maxim MD.  [facility] 06/13/2022, 9:35 AM

## 2022-06-13 NOTE — Plan of Care (Signed)

## 2022-06-18 ENCOUNTER — Encounter: Payer: Medicare Other | Admitting: Family

## 2022-06-21 ENCOUNTER — Other Ambulatory Visit (HOSPITAL_COMMUNITY): Payer: Self-pay

## 2022-06-25 ENCOUNTER — Other Ambulatory Visit: Payer: Self-pay

## 2022-06-26 ENCOUNTER — Other Ambulatory Visit (HOSPITAL_COMMUNITY): Payer: Self-pay

## 2022-06-28 ENCOUNTER — Inpatient Hospital Stay: Payer: Medicare Other | Attending: Internal Medicine

## 2022-06-28 ENCOUNTER — Inpatient Hospital Stay (HOSPITAL_BASED_OUTPATIENT_CLINIC_OR_DEPARTMENT_OTHER): Payer: Medicare Other | Admitting: Internal Medicine

## 2022-06-28 ENCOUNTER — Encounter: Payer: Self-pay | Admitting: Internal Medicine

## 2022-06-28 VITALS — BP 109/68 | HR 106 | Temp 97.4°F | Resp 17 | Wt 110.0 lb

## 2022-06-28 DIAGNOSIS — I5033 Acute on chronic diastolic (congestive) heart failure: Secondary | ICD-10-CM | POA: Insufficient documentation

## 2022-06-28 DIAGNOSIS — C7951 Secondary malignant neoplasm of bone: Secondary | ICD-10-CM | POA: Diagnosis present

## 2022-06-28 DIAGNOSIS — Z87891 Personal history of nicotine dependence: Secondary | ICD-10-CM | POA: Diagnosis not present

## 2022-06-28 DIAGNOSIS — C61 Malignant neoplasm of prostate: Secondary | ICD-10-CM | POA: Insufficient documentation

## 2022-06-28 DIAGNOSIS — K219 Gastro-esophageal reflux disease without esophagitis: Secondary | ICD-10-CM | POA: Insufficient documentation

## 2022-06-28 DIAGNOSIS — Z79818 Long term (current) use of other agents affecting estrogen receptors and estrogen levels: Secondary | ICD-10-CM | POA: Diagnosis not present

## 2022-06-28 DIAGNOSIS — C787 Secondary malignant neoplasm of liver and intrahepatic bile duct: Secondary | ICD-10-CM | POA: Diagnosis present

## 2022-06-28 DIAGNOSIS — I110 Hypertensive heart disease with heart failure: Secondary | ICD-10-CM | POA: Insufficient documentation

## 2022-06-28 DIAGNOSIS — E785 Hyperlipidemia, unspecified: Secondary | ICD-10-CM | POA: Diagnosis not present

## 2022-06-28 DIAGNOSIS — Z79899 Other long term (current) drug therapy: Secondary | ICD-10-CM | POA: Insufficient documentation

## 2022-06-28 LAB — COMPREHENSIVE METABOLIC PANEL
ALT: 22 U/L (ref 0–44)
AST: 32 U/L (ref 15–41)
Albumin: 3.4 g/dL — ABNORMAL LOW (ref 3.5–5.0)
Alkaline Phosphatase: 178 U/L — ABNORMAL HIGH (ref 38–126)
Anion gap: 10 (ref 5–15)
BUN: 25 mg/dL — ABNORMAL HIGH (ref 8–23)
CO2: 24 mmol/L (ref 22–32)
Calcium: 8.6 mg/dL — ABNORMAL LOW (ref 8.9–10.3)
Chloride: 105 mmol/L (ref 98–111)
Creatinine, Ser: 0.8 mg/dL (ref 0.61–1.24)
GFR, Estimated: >60 mL/min (ref >60)
Glucose, Bld: 146 mg/dL — ABNORMAL HIGH (ref 70–99)
Potassium: 3.9 mmol/L (ref 3.5–5.1)
Sodium: 139 mmol/L (ref 135–145)
Total Bilirubin: 0.8 mg/dL (ref 0.3–1.2)
Total Protein: 6.3 g/dL — ABNORMAL LOW (ref 6.5–8.1)

## 2022-06-28 LAB — CBC WITH DIFFERENTIAL/PLATELET
Abs Immature Granulocytes: 0.03 10*3/uL (ref 0.00–0.07)
Basophils Absolute: 0 10*3/uL (ref 0.0–0.1)
Basophils Relative: 1 %
Eosinophils Absolute: 0 10*3/uL (ref 0.0–0.5)
Eosinophils Relative: 0 %
HCT: 43 % (ref 39.0–52.0)
Hemoglobin: 14 g/dL (ref 13.0–17.0)
Immature Granulocytes: 0 %
Lymphocytes Relative: 14 %
Lymphs Abs: 1.1 10*3/uL (ref 0.7–4.0)
MCH: 31.5 pg (ref 26.0–34.0)
MCHC: 32.6 g/dL (ref 30.0–36.0)
MCV: 96.6 fL (ref 80.0–100.0)
Monocytes Absolute: 0.4 10*3/uL (ref 0.1–1.0)
Monocytes Relative: 5 %
Neutro Abs: 6.1 10*3/uL (ref 1.7–7.7)
Neutrophils Relative %: 80 %
Platelets: 288 10*3/uL (ref 150–400)
RBC: 4.45 MIL/uL (ref 4.22–5.81)
RDW: 14.6 % (ref 11.5–15.5)
WBC: 7.7 10*3/uL (ref 4.0–10.5)
nRBC: 0 % (ref 0.0–0.2)

## 2022-06-28 LAB — PSA: Prostatic Specific Antigen: 0.51 ng/mL (ref 0.00–4.00)

## 2022-06-28 NOTE — Progress Notes (Signed)
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 Status post prostate biopsy on 06/27/2021 which showed 3+3 adenocarcinoma.  Likely not a true representation of his disease.  He is not a chemo chemotherapy candidate due to dementia and other multiple comorbidities.  -PSA from 12/12/21 prior to starting treatment 245. Patient had multiple no-shows for starting Firmagon.  Eventually, he was started on 12/12/2021 with firmagon loading dose of 240 mg and received maintenance Firmagon x 1.  He was transitioned to Eligard 57-month injection on 02/13/2022. Started enzalutamide 160 mg daily on 02/23/2023.  Last taken enzalutamide on 06/04/2022.  -He was admitted to the hospital in March 2024 due to acute diastolic CHF exacerbation in setting of severe aortic stenosis.  Treated with IV Lasix.  Since discharge has been on 4 L oxygen.  He does have decline in his functional status further.  I will continue to hold Xtandi at this time.  CBC and CMP largely unremarkable.  PSA is pending.  He will be due for Eligard injection on May 28.  A note was made to the facility to ensure there is a caregiver accompanying him.  Patient has dementia hard of hearing and is unable to provide any history.   # History of GI bleed - Admitted in January 2024 for GI bleed secondary to gastric and duodenal ulcer.  Upper endoscopy done by Dr. Servando Snare showed oozing cratered  gastric ulcer with visible vessel in the gastric antrum s/p epinephrine and coagulation.  1 nonbleeding cratered gastric ulcer in the gastric antrum.  1 nonbleeding duodenal ulcer.  #normocytic anemia  -Resolved  #Essential hypertension - can worsen with ADT treatment. Will closely monitor - continue with lisinopril 40 mg daily   Dyslipidemia - continue with lipitor 80 mg daily   Weakness of right lower extremity -Per caregiver, this has been chronic. - Patient was no-show for MRI appointments. His weakness is stable.    RTC in 6 weeks for MD visit, labs, Lupron  Orders Placed This Encounter  Procedures   CBC with Differential/Platelet    Standing Status:   Future    Standing Expiration Date:   06/28/2023   Comprehensive metabolic panel    Standing Status:   Future    Standing Expiration Date:   06/28/2023   PSA    Standing Status:   Future    Standing Expiration Date:   06/28/2023   The total time spent in the appointment was 30 minutes encounter with patients including review of chart and various tests results, discussions about plan of care and coordination of care plan   All questions were answered. The patient knows to call the clinic with any problems, questions or concerns. No barriers to learning was detected.  Michaelyn Barter, MD 4/11/202411:59 AM   HISTORY OF PRESENTING ILLNESS:  Adam Good 77 y.o. male is here because of new diagnosis of metastatic prostate cancer.   INTERVAL HISTORY-  [facility]  Patient has dementia and is hard of hearing.  He was unable to answer my questions appropriately.  Did not have any caregiver with him.  I could not elicit any history He was recently discharged from the hospital from acute diastolic CHF exacerbation from severe aortic stenosis.  Treated with IV Lasix.  He was discharged on 4 L of oxygen and continues to be on it.  Also has Foley catheter in place.  He last took [place] on  March 18.  I have reviewed his chart and materials related to his cancer extensively and collaborated history with the patient. Summary of oncologic history is as follows: Oncology History Overview Note  Metastatic prostate cancer to liver, bones and retroperitoneal lymph nodes   Prostate cancer metastatic to multiple sites  06/08/2021 Initial Diagnosis   Initial visit with Dr. Lonna Cobb of Urology for elevated PSA of 194.74   06/27/2021 Procedure   TRUS/transrectal prostate biopsies.    06/27/2021 Pathology Results   Diagnostic Summary   [A] PROSTATE, LEFT BASE:   ACINAR ADENOCARCINOMA, GLEASON 3+3=6  (GG  1), INVOLVING 1 OF 1 CORES, MEASURING 4  MM ( 20%).   [B] PROSTATE, LEFT MID:   ACINAR ADENOCARCINOMA, GLEASON 3+3=6  (GG 1),  INVOLVING 1 OF 2 CORES, MEASURING 4  MM ( 25%).   [C] PROSTATE, LEFT APEX:   ACINAR ADENOCARCINOMA, GLEASON 3+3=6  (GG  1), INVOLVING 2 OF 4 CORES, MEASURING 8  MM ( 42%).   [D] PROSTATE, RIGHT BASE:   NEGATIVE FOR MALIGNANCY.   [E] PROSTATE, RIGHT MID:   ACINAR ADENOCARCINOMA, GLEASON 3+3=6  (GG  1), INVOLVING 1 OF 3 CORES, MEASURING 8  MM ( 73%).   [F] PROSTATE, RIGHT APEX:   ACINAR ADENOCARCINOMA, GLEASON 3+3=6  (GG  1), INVOLVING 2 OF 2 CORES, MEASURING 22  MM ( 92%).   10/02/2021 Imaging   CT abdomen/pelvis  IMPRESSION: 1. Diffuse, heterogeneously lytic and sclerotic osseous metastatic disease involving the included axial skeleton. 2. Numerous hypodense metastatic liver lesions. 3. Numerous enlarged metastatic retroperitoneal lymph nodes. 4. Moderate hiatal hernia with intrathoracic position of the gastric fundus. 5. Mild prostatomegaly. 6. Aortic valve calcifications. Correlate for echocardiographic evidence of aortic valve dysfunction.  Bone scan - There are multiple foci of intense radiopharmaceutical uptake in the bones, highly suspicious for metastatic disease. Prominent uptake is noted throughout the thoracolumbar spine, scattered throughout  the pelvis, especially near the left sacroiliac joint, and in multiple ribs (greatest in approximately the posterior aspect of the right 7th rib).     10/26/2021 Cancer Staging   Staging form: Prostate, AJCC 8th Edition - Clinical: Stage IVB (cM1, Grade Group: 1) - Signed by Michaelyn Barter, MD on 10/26/2021 Histologic grading system: 5 grade system     MEDICAL HISTORY:  Past Medical History:  Diagnosis Date   Alcohol abuse    Alzheimer disease    Aortic stenosis 06/04/2017   a.) TTE 06/04/2017: mild; MPG 20 mmHg. b.) TTE 08/14/209: mild-mod: MPG 22 mmHg. c.) TTE 03/26/2018: mod: MPG 30 mmHg. d.) TTE 06/15/2021: severe; MPG 44.5 mmHG   Bipolar disorder    Cardiac murmur    COPD (chronic obstructive pulmonary disease)    Demand ischemia of myocardium 01/2014   a.) positive troponins in the setting of a fall during intoxication; b.) Myoview 2/16: EF 65-70%, no evi of ischemia or infarct. No RWMA.; c.) MV 9/16: mild ischemia in the mid anterolateral, mid anterior, basal anterolateral and basal anterior segments, patient declined LHC  Dementia    Diastolic dysfunction    a.) TTE 9/16: EF of 50-55%, G2DD, severely dilated LA, and moderate AS; b.) TTE 3/19: EF of 50-55%, G1DD, mildly dilated LA, mildly calcified MV leaflets, mild AS, mild TR, trace PR, RVSP 46 mmHg, trivial pericardial effusion. c.) TTE 03/26/2018: EF 60-65%; mild LA dilation, mod AS (MPG 30 mmHG); G2DD. d.) TTE 06/08/2021: EF 60-65%; severe AS (MPG 44.5 mmHg); G1DD.   Dyslipidemia    Essential hypertension    Gastroesophageal reflux disease    HLD (hyperlipidemia)    HOH (hard of hearing)    NSTEMI (non-ST elevated myocardial infarction) 11/17/2014   a.) TTE with EF 50-55%; G3DD; severe dilated LA; mod AS; stress testing revealed mild ischemia in the mid anterolateral, mid anterior, basal and lateral, and basal anterior segments; LHC recommended, however patient declined opting for medical mgmt.   Prostate cancer    Sleep  apnea    Stroke    Vitamin D deficiency disease     SURGICAL HISTORY: Past Surgical History:  Procedure Laterality Date   ESOPHAGOGASTRODUODENOSCOPY N/A 04/13/2022   Procedure: ESOPHAGOGASTRODUODENOSCOPY (EGD);  Surgeon: Midge Minium, MD;  Location: [facility] ENDOSCOPY;  Service: Endoscopy;  Laterality: N/A;   HERNIA REPAIR     KNEE SURGERY     PROSTATE BIOPSY N/A 06/27/2021   Procedure: PROSTATE BIOPSY;  Surgeon: Riki Altes, MD;  Location: [facility] ORS;  Service: Urology;  Laterality: N/A;   TRANSRECTAL ULTRASOUND N/A 06/27/2021   Procedure: TRANSRECTAL ULTRASOUND;  Surgeon: Riki Altes, MD;  Location: [facility] ORS;  Service: Urology;  Laterality: N/A;    SOCIAL HISTORY: Social History   Socioeconomic History   Marital status: Single    Spouse name: Not on file   Number of children: Not on file   Years of education: Not on file   Highest education level: Not on file  Occupational History   Not on file  Tobacco Use   Smoking status: Former    Packs/day: 0.50    Years: 57.00    Additional pack years: 0.00    Total pack years: 28.50    Types: Cigarettes    Quit date: 02/2021    Years since quitting: 1.3   Smokeless tobacco: Current    Types: Snuff  Vaping Use   Vaping Use: Former  Substance and Sexual Activity   Alcohol use: Not Currently    Comment: He has been a heavy alcohol drinker for > 30 yrs.  Stats that he quit 3 weeks ago.    Drug use: No   Sexual activity: Not Currently  Other Topics Concern   Not on file  Social History Narrative   Not on file   Social Determinants of Health   Financial Resource Strain: Not on file  Food Insecurity: Not on file  Transportation Needs: Not on file  Physical Activity: Not on file  Stress: Not on file  Social Connections: Not on file  Intimate Partner Violence: Not on file    FAMILY HISTORY: Family History  Problem Relation Age of Onset   Throat cancer Mother    Cancer Father    CAD Sister    Hypertension Sister     Diabetes Brother     ALLERGIES:  has No Known Allergies.  MEDICATIONS:  Current Outpatient Medications  Medication Sig Dispense Refill   acetaminophen (TYLENOL) 500 MG tablet Take 500 mg by mouth every 6 (six) hours as needed.     ADVAIR DISKUS 250-50 MCG/ACT AEPB Inhale 1  puff into the lungs 2 (two) times daily.     alum & mag hydroxide-simeth (MAALOX/MYLANTA) 200-200-20 MG/5ML suspension Take by mouth every 6 (six) hours as needed for indigestion or heartburn.     ascorbic acid (VITAMIN C) 500 MG tablet Take 500 mg by mouth 2 (two) times daily.     atorvastatin (LIPITOR) 80 MG tablet Take 80 mg by mouth at bedtime.     enzalutamide (XTANDI) 40 MG tablet Take 4 tablets (160 mg total) by mouth daily. 120 tablet 2   feeding supplement (ENSURE ENLIVE / ENSURE PLUS) LIQD Take 237 mLs by mouth 3 (three) times daily between meals. 237 mL 12   furosemide (LASIX) 20 MG tablet Take 1 tablet (20 mg total) by mouth daily. 30 tablet 1   ipratropium-albuterol (DUONEB) 0.5-2.5 (3) MG/3ML SOLN Take 3 mLs by nebulization every 4 (four) hours as needed.     lisinopril (ZESTRIL) 5 MG tablet Take 5 mg by mouth daily.     loperamide (IMODIUM) 2 MG capsule Take 2 mg by mouth 4 (four) times daily as needed for diarrhea or loose stools.     Magnesium 200 MG TABS Take 1 tablet by mouth daily.     magnesium hydroxide (MILK OF MAGNESIA) 400 MG/5ML suspension Take 30 mLs by mouth daily as needed for mild constipation.     megestrol (MEGACE) 20 MG tablet Take 20 mg by mouth daily.     Multiple Vitamin (MULTIVITAMIN WITH MINERALS) TABS tablet Take 1 tablet by mouth daily.     polyethylene glycol (MIRALAX / GLYCOLAX) 17 g packet Take 17 g by mouth daily.     senna (SENOKOT) 8.6 MG TABS tablet Take 1 tablet by mouth in the morning and at bedtime.     thiamine (VITAMIN B-1) 100 MG tablet Take 100 mg by mouth daily.     pantoprazole (PROTONIX) 40 MG tablet Take 1 tablet (40 mg total) by mouth 2 (two) times daily for 30  days, THEN 1 tablet (40 mg total) daily. 90 tablet 0   No current facility-administered medications for this visit.    REVIEW OF SYSTEMS:   Pertinent information mentioned in HPI All other systems were reviewed with the patient and are negative.  PHYSICAL EXAMINATION: ECOG PERFORMANCE STATUS: 3 - Symptomatic, >50% confined to bed  Vitals:   06/28/22 1025  BP: 109/68  Pulse: (!) 106  Resp: 17  Temp: (!) 97.4 F (36.3 C)  SpO2: (!) 86%    Filed Weights   06/28/22 1025  Weight: 110 lb (49.9 kg)     GENERAL:alert, no distress and comfortable SKIN: skin color, texture, turgor are normal, no rashes or significant lesions EYES: normal, conjunctiva are pink and non-injected, sclera clear OROPHARYNX:no exudate, no erythema and lips, buccal mucosa, and tongue normal  NECK: supple, thyroid normal size, non-tender, without nodularity LYMPH:  no palpable lymphadenopathy in the cervical, axillary or inguinal LUNGS: clear to auscultation and percussion with normal breathing effort HEART: regular rate & rhythm and no murmurs and no lower extremity edema ABDOMEN:abdomen soft, non-tender and normal bowel sounds Musculoskeletal:no cyanosis of digits and no clubbing  PSYCH: alert & oriented x 2 to place and self NEURO: RLE weakness 3/5. Sensation intact  LABORATORY DATA:  I have reviewed the data as listed Lab Results  Component Value Date   WBC 7.7 06/28/2022   HGB 14.0 06/28/2022   HCT 43.0 06/28/2022   MCV 96.6 06/28/2022   PLT 288 06/28/2022   Recent Labs  04/17/22 0425 04/18/22 0450 06/05/22 1602 06/09/22 0540 06/10/22 0509 06/28/22 1028  NA 133* 134*   < > 137 134* 139  K 3.2* 4.1   < > 4.6 4.6 3.9  CL 105 106   < > 102 102 105  CO2 24 21*   < > GLUCOSE 100* 85   < > 87 99 146*  BUN 30* 27*   < > 32* 29* 25*  CREATININE 1.06 0.97   < > 0.86 0.78 0.80  CALCIUM 7.1* 7.5*   < > 8.1* 8.3* 8.6*  GFRNONAA >60 >60   < > >60 >60 >60  PROT 3.7* 3.7*  --   --    --  6.3*  ALBUMIN 1.6* 1.6*  --   --   --  3.4*  AST 99* 97*  --   --   --  32  ALT 36 38  --   --   --  22  ALKPHOS 218* 228*  --   --   --  178*  BILITOT 0.7 0.7  --   --   --  0.8   < > = values in this interval not displayed.    RADIOGRAPHIC STUDIES: I have personally reviewed the radiological images as listed and agreed with the findings in the report. ECHOCARDIOGRAM COMPLETE  Result Date: 06/07/2022    ECHOCARDIOGRAM REPORT   Patient Name:   TYRIE PORZIO Stief Date of Exam: 06/07/2022 Medical Rec #:  [id number]        Height:       68.0 in Accession #:    [id number]       Weight:       113.8 lb Date of Birth:  19-May-1945        BSA:          1.608 m Patient Age:    77 years         BP:           111/78 mmHg Patient Gender: M                HR:           74 bpm. Exam Location:  [facility] Procedure: 2D Echo, Cardiac Doppler and Color Doppler Indications:     Abnormal ECG  History:         Patient has prior history of Echocardiogram examinations, most                  recent 06/15/2021. CHF, Signs/Symptoms:Murmur and Shortness of                  Breath; Risk Factors:Hypertension, Dyslipidemia and Current                  Smoker. ETOH abuse, Dementia.  Sonographer:     Mikki Harbor Referring Phys:  [id number] [facility] AMIN Diagnosing Phys: Adrian Blackwater  Sonographer Comments: Technically difficult study due to poor echo windows. Image acquisition challenging due to patient body habitus. IMPRESSIONS  1. Left ventricular ejection fraction, by estimation, is 50 to 55%. The left ventricle has low normal function. The left ventricle demonstrates global hypokinesis. The left ventricular internal cavity size was mildly to moderately dilated. There is moderate concentric left ventricular hypertrophy. Left ventricular diastolic parameters are consistent with Grade II diastolic dysfunction (pseudonormalization).  2. Right ventricular systolic function is low normal. The right ventricular size is mildly enlarged.  Moderately increased right ventricular wall thickness. There  is mildly elevated pulmonary artery systolic pressure.  3. Left atrial size was severely dilated.  4. Right atrial size was severely dilated.  5. The mitral valve is myxomatous. Severe mitral valve regurgitation.  6. Tricuspid valve regurgitation is moderate.  7. The aortic valve is calcified. Aortic valve regurgitation is mild. Severe aortic valve stenosis. Conclusion(s)/Recommendation(s): Findings consistent with severe valvular heart disease. FINDINGS  Left Ventricle: Left ventricular ejection fraction, by estimation, is 50 to 55%. The left ventricle has low normal function. The left ventricle demonstrates global hypokinesis. The left ventricular internal cavity size was mildly to moderately dilated. There is moderate concentric left ventricular hypertrophy. Left ventricular diastolic parameters are consistent with Grade II diastolic dysfunction (pseudonormalization). Right Ventricle: The right ventricular size is mildly enlarged. Moderately increased right ventricular wall thickness. Right ventricular systolic function is low normal. There is mildly elevated pulmonary artery systolic pressure. The tricuspid regurgitant velocity is 2.76 m/s, and with an assumed right atrial pressure of 8 mmHg, the estimated right ventricular systolic pressure is 38.5 mmHg. Left Atrium: Left atrial size was severely dilated. Right Atrium: Right atrial size was severely dilated. Pericardium: There is no evidence of pericardial effusion. Mitral Valve: The mitral valve is myxomatous. Severe mitral valve regurgitation. MV peak gradient, 3.3 mmHg. The mean mitral valve gradient is 1.0 mmHg. Tricuspid Valve: The tricuspid valve is grossly normal. Tricuspid valve regurgitation is moderate. Aortic Valve: The aortic valve is calcified. Aortic valve regurgitation is mild. Severe aortic stenosis is present. Aortic valve mean gradient measures 43.7 mmHg. Aortic valve peak gradient  measures 78.9 mmHg. Aortic valve area, by VTI measures 0.76 cm. Pulmonic Valve: The pulmonic valve was normal in structure. Pulmonic valve regurgitation is trivial. Aorta: The aortic root, ascending aorta and aortic arch are all structurally normal, with no evidence of dilitation or obstruction. IAS/Shunts: No atrial level shunt detected by color flow Doppler.  LEFT VENTRICLE PLAX 2D LVIDd:         3.60 cm   Diastology LVIDs:         2.70 cm   LV e' medial:    6.53 cm/s LV PW:         1.30 cm   LV E/e' medial:  12.2 LV IVS:        1.40 cm   LV e' lateral:   9.03 cm/s LVOT diam:     2.00 cm   LV E/e' lateral: 8.8 LV SV:         83 LV SV Index:   52 LVOT Area:     3.14 cm  RIGHT VENTRICLE RV Basal diam:  3.45 cm RV Mid diam:    3.10 cm RV S prime:     7.94 cm/s TAPSE (M-mode): 1.5 cm LEFT ATRIUM           Index        RIGHT ATRIUM           Index LA diam:      4.20 cm 2.61 cm/m   RA Area:     19.90 cm LA Vol (A4C): 89.1 ml 55.41 ml/m  RA Volume:   59.80 ml  37.19 ml/m  AORTIC VALVE                     PULMONIC VALVE AV Area (Vmax):    0.79 cm      PV Vmax:       1.03 m/s AV Area (Vmean):   0.71 cm  PV Peak grad:  4.2 mmHg AV Area (VTI):     0.76 cm AV Vmax:           444.00 cm/s AV Vmean:          304.667 cm/s AV VTI:            1.090 m AV Peak Grad:      78.9 mmHg AV Mean Grad:      43.7 mmHg LVOT Vmax:         111.00 cm/s LVOT Vmean:        68.700 cm/s LVOT VTI:          0.265 m LVOT/AV VTI ratio: 0.24  AORTA Ao Root diam: 3.20 cm Ao Asc diam:  4.00 cm MITRAL VALVE                  TRICUSPID VALVE MV Area (PHT): 5.13 cm       TR Peak grad:   30.5 mmHg MV Area VTI:   4.18 cm       TR Vmax:        276.00 cm/s MV Peak grad:  3.3 mmHg MV Mean grad:  1.0 mmHg       SHUNTS MV Vmax:       0.90 m/s       Systemic VTI:  0.26 m MV Vmean:      40.3 cm/s      Systemic Diam: 2.00 cm MV Decel Time: 148 msec MR Peak grad:    158.3 mmHg MR Mean grad:    102.0 mmHg MR Vmax:         629.00 cm/s MR Vmean:        470.0  cm/s MR PISA:         5.67 cm MR PISA Eff ROA: 83 mm MR PISA Radius:  0.95 cm MV E velocity: 79.80 cm/s MV A velocity: 38.40 cm/s MV E/A ratio:  2.08 Shaukat Khan Electronically signed by Adrian BlackwaterShaukat Khan Signature Date/Time: 06/07/2022/3:37:13 PM    Final    DG Chest 2 View  Result Date: 06/05/2022 CLINICAL DATA:  sob EXAM: CHEST - 2 VIEW COMPARISON:  Chest x-ray 09/26/2018 FINDINGS: Small left pleural effusion. Overlying left basilar and mid lung opacities. Mild diffuse interstitial prominence. No visible pneumothorax. Cardiomediastinal silhouette is similar. IMPRESSION: 1. Small left pleural effusion. Overlying left basilar and mid lung opacities could represent atelectasis, aspiration, and/or pneumonia. Followup PA and lateral chest X-ray is recommended in 3-4 weeks to ensure resolution and exclude underlying malignancy. 2. Mild diffuse interstitial prominence, possibly mild interstitial edema. Electronically Signed   By: Feliberto HartsFrederick S Jones M.D.   On: 06/05/2022 16:23

## 2022-06-28 NOTE — Progress Notes (Signed)
Patient arrived without a care giver and was unable to answer any of my questions due to dementia. I was able to fill in information for his medications. [name] arrived in sleep pants, thin t-shirt, sweat jacket and no shoes.   He is currently on 4L of high flow O2.

## 2022-07-18 ENCOUNTER — Other Ambulatory Visit (HOSPITAL_COMMUNITY): Payer: Self-pay

## 2022-07-20 ENCOUNTER — Other Ambulatory Visit: Payer: Self-pay | Admitting: Family Medicine

## 2022-07-20 DIAGNOSIS — J99 Respiratory disorders in diseases classified elsewhere: Secondary | ICD-10-CM

## 2022-07-20 DIAGNOSIS — R9389 Abnormal findings on diagnostic imaging of other specified body structures: Secondary | ICD-10-CM

## 2022-07-25 ENCOUNTER — Ambulatory Visit: Admission: RE | Admit: 2022-07-25 | Payer: Medicare Other | Source: Ambulatory Visit

## 2022-08-02 ENCOUNTER — Ambulatory Visit
Admission: RE | Admit: 2022-08-02 | Discharge: 2022-08-02 | Disposition: A | Discharge status: Home / Self Care | Payer: Medicare Other | Source: Ambulatory Visit | Attending: Family Medicine | Admitting: Family Medicine

## 2022-08-02 DIAGNOSIS — R9389 Abnormal findings on diagnostic imaging of other specified body structures: Secondary | ICD-10-CM | POA: Diagnosis present

## 2022-08-02 DIAGNOSIS — J99 Respiratory disorders in diseases classified elsewhere: Secondary | ICD-10-CM | POA: Diagnosis not present

## 2022-08-14 ENCOUNTER — Inpatient Hospital Stay: Payer: Medicare Other | Admitting: Internal Medicine

## 2022-08-14 ENCOUNTER — Inpatient Hospital Stay: Payer: Medicare Other

## 2022-08-14 ENCOUNTER — Other Ambulatory Visit (HOSPITAL_COMMUNITY): Payer: Self-pay

## 2022-08-18 DEATH — deceased

## 2022-08-23 ENCOUNTER — Other Ambulatory Visit: Payer: Self-pay
